# Patient Record
Sex: Female | Born: 1935 | Race: White | Smoking: Never smoker
Health system: Southern US, Community
[De-identification: ages and names within clinical notes are randomized; demographics above are authoritative.]

## PROBLEM LIST (undated history)

## (undated) DIAGNOSIS — I471 Supraventricular tachycardia, unspecified: Secondary | ICD-10-CM

## (undated) DIAGNOSIS — G459 Transient cerebral ischemic attack, unspecified: Secondary | ICD-10-CM

## (undated) DIAGNOSIS — I82409 Acute embolism and thrombosis of unspecified deep veins of unspecified lower extremity: Secondary | ICD-10-CM

## (undated) DIAGNOSIS — H9190 Unspecified hearing loss, unspecified ear: Secondary | ICD-10-CM

## (undated) DIAGNOSIS — K922 Gastrointestinal hemorrhage, unspecified: Secondary | ICD-10-CM

## (undated) DIAGNOSIS — M810 Age-related osteoporosis without current pathological fracture: Secondary | ICD-10-CM

## (undated) DIAGNOSIS — I495 Sick sinus syndrome: Secondary | ICD-10-CM

## (undated) DIAGNOSIS — D696 Thrombocytopenia, unspecified: Secondary | ICD-10-CM

## (undated) DIAGNOSIS — K661 Hemoperitoneum: Secondary | ICD-10-CM

## (undated) DIAGNOSIS — R946 Abnormal results of thyroid function studies: Secondary | ICD-10-CM

## (undated) DIAGNOSIS — I5032 Chronic diastolic (congestive) heart failure: Secondary | ICD-10-CM

## (undated) DIAGNOSIS — I10 Essential (primary) hypertension: Secondary | ICD-10-CM

## (undated) DIAGNOSIS — I639 Cerebral infarction, unspecified: Secondary | ICD-10-CM

## (undated) DIAGNOSIS — I4891 Unspecified atrial fibrillation: Secondary | ICD-10-CM

## (undated) DIAGNOSIS — E7800 Pure hypercholesterolemia, unspecified: Secondary | ICD-10-CM

## (undated) DIAGNOSIS — D219 Benign neoplasm of connective and other soft tissue, unspecified: Secondary | ICD-10-CM

## (undated) DIAGNOSIS — M6281 Muscle weakness (generalized): Secondary | ICD-10-CM

## (undated) HISTORY — DX: Chronic diastolic (congestive) heart failure: I50.32

## (undated) HISTORY — DX: Supraventricular tachycardia, unspecified: I47.10

## (undated) HISTORY — DX: Supraventricular tachycardia: I47.1

## (undated) HISTORY — DX: Thrombocytopenia, unspecified: D69.6

## (undated) HISTORY — DX: Hemoperitoneum: K66.1

## (undated) HISTORY — PX: DILATION AND CURETTAGE OF UTERUS: SHX78

## (undated) HISTORY — DX: Age-related osteoporosis without current pathological fracture: M81.0

## (undated) HISTORY — DX: Sick sinus syndrome: I49.5

## (undated) HISTORY — DX: Acute embolism and thrombosis of unspecified deep veins of unspecified lower extremity: I82.409

## (undated) HISTORY — DX: Cerebral infarction, unspecified: I63.9

## (undated) HISTORY — DX: Benign neoplasm of connective and other soft tissue, unspecified: D21.9

## (undated) HISTORY — DX: Abnormal results of thyroid function studies: R94.6

## (undated) HISTORY — DX: Unspecified atrial fibrillation: I48.91

## (undated) HISTORY — DX: Muscle weakness (generalized): M62.81

## (undated) HISTORY — DX: Transient cerebral ischemic attack, unspecified: G45.9

## (undated) HISTORY — DX: Essential (primary) hypertension: I10

## (undated) HISTORY — PX: CATARACT EXTRACTION: SUR2

---

## 1985-01-27 HISTORY — PX: OTHER SURGICAL HISTORY: SHX169

## 2005-01-27 DIAGNOSIS — G459 Transient cerebral ischemic attack, unspecified: Secondary | ICD-10-CM

## 2005-01-27 HISTORY — DX: Transient cerebral ischemic attack, unspecified: G45.9

## 2010-01-27 HISTORY — PX: CATARACT EXTRACTION W/ INTRAOCULAR LENS IMPLANT: SHX1309

## 2010-01-27 HISTORY — PX: CARDIAC PACEMAKER PLACEMENT: SHX583

## 2010-06-18 ENCOUNTER — Emergency Department (HOSPITAL_COMMUNITY): Payer: Medicare Other

## 2010-06-18 ENCOUNTER — Inpatient Hospital Stay (HOSPITAL_COMMUNITY)
Admission: EM | Admit: 2010-06-18 | Discharge: 2010-06-21 | DRG: 242 | Disposition: A | Discharge status: Home / Self Care | Payer: Medicare Other | Attending: Cardiology | Admitting: Cardiology

## 2010-06-18 DIAGNOSIS — I509 Heart failure, unspecified: Secondary | ICD-10-CM | POA: Diagnosis present

## 2010-06-18 DIAGNOSIS — D696 Thrombocytopenia, unspecified: Secondary | ICD-10-CM | POA: Diagnosis present

## 2010-06-18 DIAGNOSIS — I4892 Unspecified atrial flutter: Principal | ICD-10-CM | POA: Diagnosis present

## 2010-06-18 DIAGNOSIS — E876 Hypokalemia: Secondary | ICD-10-CM | POA: Diagnosis present

## 2010-06-18 DIAGNOSIS — J95811 Postprocedural pneumothorax: Secondary | ICD-10-CM | POA: Diagnosis not present

## 2010-06-18 DIAGNOSIS — R0602 Shortness of breath: Secondary | ICD-10-CM

## 2010-06-18 DIAGNOSIS — I498 Other specified cardiac arrhythmias: Secondary | ICD-10-CM | POA: Diagnosis present

## 2010-06-18 DIAGNOSIS — Y849 Medical procedure, unspecified as the cause of abnormal reaction of the patient, or of later complication, without mention of misadventure at the time of the procedure: Secondary | ICD-10-CM | POA: Diagnosis not present

## 2010-06-18 DIAGNOSIS — Z8673 Personal history of transient ischemic attack (TIA), and cerebral infarction without residual deficits: Secondary | ICD-10-CM

## 2010-06-18 DIAGNOSIS — R002 Palpitations: Secondary | ICD-10-CM

## 2010-06-18 DIAGNOSIS — I4891 Unspecified atrial fibrillation: Secondary | ICD-10-CM | POA: Diagnosis present

## 2010-06-18 DIAGNOSIS — I5033 Acute on chronic diastolic (congestive) heart failure: Secondary | ICD-10-CM | POA: Diagnosis present

## 2010-06-18 LAB — URINALYSIS, MICROSCOPIC ONLY
Bilirubin Urine: NEGATIVE
Ketones, ur: NEGATIVE mg/dL
Protein, ur: NEGATIVE mg/dL
Urobilinogen, UA: 0.2 mg/dL (ref 0.0–1.0)

## 2010-06-18 LAB — CBC
Hemoglobin: 14.1 g/dL (ref 12.0–15.0)
MCH: 31.2 pg (ref 26.0–34.0)
RBC: 4.52 MIL/uL (ref 3.87–5.11)
WBC: 12.1 10*3/uL — ABNORMAL HIGH (ref 4.0–10.5)

## 2010-06-18 LAB — DIFFERENTIAL
Basophils Relative: 0 % (ref 0–1)
Lymphs Abs: 1.4 10*3/uL (ref 0.7–4.0)
Monocytes Relative: 6 % (ref 3–12)
Neutro Abs: 9.9 10*3/uL — ABNORMAL HIGH (ref 1.7–7.7)
Neutrophils Relative %: 82 % — ABNORMAL HIGH (ref 43–77)

## 2010-06-18 LAB — CARDIAC PANEL(CRET KIN+CKTOT+MB+TROPI)
CK, MB: 2.4 ng/mL (ref 0.3–4.0)
Relative Index: INVALID (ref 0.0–2.5)
Relative Index: INVALID (ref 0.0–2.5)
Total CK: 65 U/L (ref 7–177)
Troponin I: <0.3 ng/mL (ref <0.30)

## 2010-06-18 LAB — PRO B NATRIURETIC PEPTIDE: Pro B Natriuretic peptide (BNP): 22545 pg/mL — ABNORMAL HIGH (ref 0–450)

## 2010-06-18 LAB — POCT I-STAT, CHEM 8
BUN: 43 mg/dL — ABNORMAL HIGH (ref 6–23)
Calcium, Ion: 1.05 mmol/L — ABNORMAL LOW (ref 1.12–1.32)
Creatinine, Ser: 1.4 mg/dL — ABNORMAL HIGH (ref 0.4–1.2)
TCO2: 20 mmol/L (ref 0–100)

## 2010-06-18 LAB — POCT CARDIAC MARKERS
Myoglobin, poc: 123 ng/mL (ref 12–200)
Troponin i, poc: <0.05 ng/mL (ref 0.00–0.09)

## 2010-06-18 LAB — TSH: TSH: 7.984 u[IU]/mL — ABNORMAL HIGH (ref 0.350–4.500)

## 2010-06-18 LAB — PROTIME-INR: Prothrombin Time: 14.2 seconds (ref 11.6–15.2)

## 2010-06-18 LAB — SODIUM, URINE, RANDOM: Sodium, Ur: 105 mEq/L

## 2010-06-18 LAB — LACTIC ACID, PLASMA: Lactic Acid, Venous: 2.8 mmol/L — ABNORMAL HIGH (ref 0.5–2.2)

## 2010-06-18 LAB — CREATININE, URINE, RANDOM: Creatinine, Urine: 28.93 mg/dL

## 2010-06-18 LAB — APTT: aPTT: 25 seconds (ref 24–37)

## 2010-06-19 ENCOUNTER — Inpatient Hospital Stay (HOSPITAL_COMMUNITY): Payer: Medicare Other

## 2010-06-19 DIAGNOSIS — I495 Sick sinus syndrome: Secondary | ICD-10-CM

## 2010-06-19 DIAGNOSIS — I359 Nonrheumatic aortic valve disorder, unspecified: Secondary | ICD-10-CM

## 2010-06-19 LAB — BASIC METABOLIC PANEL
CO2: 27 mEq/L (ref 19–32)
Calcium: 8.5 mg/dL (ref 8.4–10.5)
Chloride: 104 mEq/L (ref 96–112)
Creatinine, Ser: 1.14 mg/dL (ref 0.4–1.2)
GFR calc Af Amer: 53 mL/min — ABNORMAL LOW (ref >60)
Sodium: 140 mEq/L (ref 135–145)

## 2010-06-19 LAB — LIPID PANEL
Cholesterol: 128 mg/dL (ref 0–200)
LDL Cholesterol: 65 mg/dL (ref 0–99)
Total CHOL/HDL Ratio: 3.1 RATIO
Triglycerides: 112 mg/dL (ref <150)
VLDL: 22 mg/dL (ref 0–40)

## 2010-06-19 LAB — CBC
Hemoglobin: 12.2 g/dL (ref 12.0–15.0)
Platelets: 139 10*3/uL — ABNORMAL LOW (ref 150–400)
RBC: 3.9 MIL/uL (ref 3.87–5.11)
WBC: 8.3 10*3/uL (ref 4.0–10.5)

## 2010-06-20 ENCOUNTER — Inpatient Hospital Stay (HOSPITAL_COMMUNITY): Payer: Medicare Other

## 2010-06-20 LAB — BASIC METABOLIC PANEL
BUN: 29 mg/dL — ABNORMAL HIGH (ref 6–23)
Chloride: 98 mEq/L (ref 96–112)
GFR calc non Af Amer: 49 mL/min — ABNORMAL LOW (ref >60)
Glucose, Bld: 99 mg/dL (ref 70–99)
Potassium: 3 mEq/L — ABNORMAL LOW (ref 3.5–5.1)
Sodium: 135 mEq/L (ref 135–145)

## 2010-06-20 LAB — CBC
HCT: 38.3 % (ref 36.0–46.0)
Hemoglobin: 12.9 g/dL (ref 12.0–15.0)
MCV: 92.3 fL (ref 78.0–100.0)
RBC: 4.15 MIL/uL (ref 3.87–5.11)
RDW: 14.8 % (ref 11.5–15.5)
WBC: 9.9 10*3/uL (ref 4.0–10.5)

## 2010-06-21 ENCOUNTER — Inpatient Hospital Stay (HOSPITAL_COMMUNITY): Payer: Medicare Other

## 2010-06-21 LAB — BASIC METABOLIC PANEL
CO2: 29 mEq/L (ref 19–32)
Calcium: 8.4 mg/dL (ref 8.4–10.5)
GFR calc Af Amer: >60 mL/min (ref >60)
GFR calc non Af Amer: >60 mL/min (ref >60)
Potassium: 3.7 mEq/L (ref 3.5–5.1)
Sodium: 138 mEq/L (ref 135–145)

## 2010-06-24 LAB — CULTURE, BLOOD (ROUTINE X 2)
Culture  Setup Time: 201205222337
Culture: NO GROWTH

## 2010-07-03 NOTE — Consult Note (Signed)
  [name], [name]             ACCOUNT NO.:  [id number]  MEDICAL RECORD NO.:  [id number]           PATIENT TYPE:  LOCATION:                                 FACILITY:  PHYSICIAN:  Duke Salvia, MD, FACCDATE OF BIRTH:  30-Nov-1910  DATE OF CONSULTATION: DATE OF DISCHARGE:                                CONSULTATION   PREOPERATIVE DIAGNOSES:  Tachybrady syndrome with supraventricular tachycardia, atrial flutter, and posttermination pauses of greater than 6 seconds.  POSTOPERATIVE DIAGNOSES:  Tachybrady syndrome with supraventricular tachycardia, atrial flutter, and posttermination pauses of greater than 6 seconds.  PROCEDURE:  Dual-chamber pacemaker implantation.  Following obtaining informed consent, the patient was brought to the electrophysiology laboratory and placed on the fluoroscopic table in supine position.  After routine prep and drape of the left upper chest, lidocaine was infiltrated in prepectoral subclavicular region.  Anincision was made and carried down to layer of the prepectoral fascia using electrocautery and sharp dissection.  A pocket was formed similarly, hemostasis was obtained.  Because of the patient's age, we undertook a left upper extremity venogram which demonstrated the course and pacing in the extrathoracic left subclavian vein.  This then served as a target for venous cannulation which was complicated notwithstanding this by 2 arterial punctures both of which were held for greater than 2 minutes.  Separate venipunctures were accomplished, guidewires were placed and retained, 7- [place] sheath were replaced which were passed sequentially.  A St. Jude 1940, 52 cm passive fixation ventricular lead serial #[id number] and a St. Jude 1944 passive fixation atrial lead serial #[id number].  Under fluoroscopic guidance, these were manipulated to the right ventricular apex from the right atrial appendage.  Respectively where bipolar R-wave was set at 28  with a pace impedance of 815, threshold of 0.5 and 0.5, current threshold 0.5 mA.  There is no diaphragmatic pacing at 10 volts.  Bipolar P-wave was 3.2 with a pace impedance of 427, threshold point 0.9 and 0.5, current threshold was 1.9 mA.  The leads were secured to the prepectoral fascia and then attached to a Medtronic Reveal  pulse generator, serial # [id number]  [id number].  P-synchronous pacing was identified.  The pocket was copiously irrigated with antibiotic-containing saline solution.  Hemostasis was assured.  The leads and pulse generator was placed in the pocket, secured to the prepectoral fascia.  The wound was closed in 3 layers in normal fashion. The wound was washed, dried and a benzoin and Steri-Strip dressing was applied.  Needle counts, sponge counts and instrument counts were correct at the end of the procedure according to the staff.  The patient tolerated the procedure without apparent complication.     Duke Salvia, MD, [facility]     SCK/MEDQ  D:  06/19/2010  T:  06/20/2010  Job:  [id number]  Electronically Signed by Sherryl Manges MD [facility] on 07/03/2010 07:36:03 AM

## 2010-07-03 NOTE — Discharge Summary (Signed)
NAMECHERRILL, SCRIMA             ACCOUNT NO.:  [id number]  MEDICAL RECORD NO.:  [id number]           PATIENT TYPE:  I  LOCATION:  6524                         FACILITY:  [facility]  PHYSICIAN:  Duke Salvia, MD, FACCDATE OF BIRTH:  07/06/1910  DATE OF ADMISSION:  06/18/2010 DATE OF DISCHARGE:  06/21/2010                              DISCHARGE SUMMARY   PROCEDURES: 1. Insertion of a Medtronic revealed pulse generator dual lead     pacemaker. 2. 2-D echocardiogram. 3. Serial two-view chest x-rays x3.  PRIMARY FINAL DISCHARGE DIAGNOSIS:  Atrial flutter and supraventricular tachycardia with tachybrady syndrome and posttermination pauses greater than 6 seconds.  SECONDARY DIAGNOSES: 1. Acute on chronic diastolic congestive heart failure. 2. History of angina, medical therapy recommended. 3. Remote history of transient ischemic attack in 2007. 4. History of uterine fibroids. 5. Postprocedure pneumothorax, approximately 5%, resolved prior to     discharge. 6. Mild thrombocytopenia with platelets of 137,000 at discharge. 7. Mildly abnormal thyroid function studies with a TSH of 7.984 (upper     limit of normal 4.5), free T4 within normal limits at 1.21 and T3     slightly low at 69.2 (lower limit of normal 80), follow up with     primary care.  TIME AT DISCHARGE:  41 minutes.  HOSPITAL COURSE:  Rachel Shah is a 77 year old female with a history of PAF.  She was here visiting her daughter from [place] and had increasing shortness of breath and palpitations.  She was in AFib RVR and she was felt to have CHF with a proBNP that was elevated at 22,545.  She was admitted for further evaluation.  Rate control with IV Lopressor totalling 35 mg was unsuccessful. Cardizem was tried but she had a post termination pauses of 6 seconds with a junctional rhythm that quickly returned to rapid AFib.  She was started on IV amiodarone and spontaneously converted to sinus rhythm after the bolus.   She was continued on her other home medications.  She was diuresed with IV Lasix at 40 mg IV b.i.d. and by discharge her O2 saturation was 95% on room air.  She had some hypokalemia with the diuresis and her potassium was supplemented.  Her BUN and creatinine were elevated on admission at 43/1.4.  This actually improved during the course of her hospital stay and at discharge she is to resume her home dose of Lasix.  Her BUN is 25 with a creatinine of 0.85 and a GFR greater than 60.  A lipid profile showed total cholesterol of 128, triglycerides 112, HDL 41, LDL 65.  Thyroid function studies were described above.  Her white count was slightly elevated on admission at 12.1 thousand and blood cultures were done.  They are negative so far but not completed until Jun 23, 2010.  She had PCR for MRSA which was negative.  Urinalysis only showed rare bacteria.  Initial chest x-ray showed mild interstitial edema.  She also had a left upper lobe calcified granuloma.  She was evaluated by EP for the post termination pauses.  She was felt to have tachybrady syndrome as well as SVT and  atrial flutter.  Dr. Graciela Husbands considered her a candidate for pacemaker and by November 19, 2010, her respiratory status was felt stabilized for the device.  She had a Medtronic Reveal pulse generator dual lead pacemaker inserted.  She tolerated the procedure well.  Postprocedure chest x-ray showed approximately 5% pneumothorax and moderate bilateral effusions. She was followed with serial chest x-rays and her exam remained stable. By Jun 21, 2010, the left apical pneumothorax was not competently identified.  She had an indeterminate focal density at the right lung base and small nodular densities in the right midlung.  There were compression fractures involving the lower thoracic and possibly one in the upper lumbar spine.  She had small bilateral pleural effusions. Because of the nodular densities in the right midlung and  the focal density at the right lung base, followup chest x-rays or a chest CT is recommended for further evaluation.  The patient prefers to wait until she returns home to obtain these studies.  She also prefers to get her postprocedure wound check and cardiac followup in [place].  A 2-D echocardiogram showed an EF of 55-60% with a moderately calcified aortic valve annulus and mild aortic regurgitation.  A trivial pericardial effusion was identified.  Her left atrial diameter was 48 mm.  Her meds were reviewed and the peridium had been discontinued because of a creatinine clearance at that time less than 50.  This can be resumed because her creatinine clearance is now greater than 60 and this can be followed as an outpatient.  On Jun 21, 2010, [name] was evaluated by Dr. Graciela Husbands.  She is considered stable for discharge, to follow up as arranged.  DISCHARGE INSTRUCTIONS:  Activity level is to be increased gradually per the discharge instruction sheet.  She is to call our office for problems with the incision.  She is to follow up with Dr. Graciela Husbands as needed.  She is to follow up with Dr. Ramiro Harvest and keep the appointment.  DISCHARGE MEDICATIONS: 1. Lopressor 50 mg b.i.d. 2. Digoxin 0.125 mg one-half tablet daily. 3. Zocor 20 mg daily. 4. Lasix 20 mg daily. 5. Imdur 30 mg daily. 6. Aspirin 81 mg a day. 7. Vitamin E OTC daily.     Theodore Demark, PA-C   ______________________________ Duke Salvia, MD, [facility]    RB/MEDQ  D:  06/21/2010  T:  06/22/2010  Job:  [id number]  cc:   Kateri Mc  Electronically Signed by Theodore Demark PA-C on 06/26/2010 11:30:04 AM Electronically Signed by Sherryl Manges MD [facility] on 07/03/2010 07:36:05 AM

## 2010-07-05 NOTE — H&P (Signed)
Rachel Shah, Rachel Shah             ACCOUNT NO.:  [id number]  MEDICAL RECORD NO.:  [id number]           PATIENT TYPE:  I  LOCATION:  2903                         FACILITY:  [facility]  PHYSICIAN:  Jesse Sans. Latayna Ritchie, MD, FACCDATE OF BIRTH:  05/05/1910  DATE OF ADMISSION:  06/18/2010 DATE OF DISCHARGE:                             HISTORY & PHYSICAL   CARDIOLOGIST:  Dr. Kateri Mc in [place], [place].  His phone number is [phone].  Fax number [phone].  CHIEF COMPLAINT:  "Not feeling well."  HISTORY OF PRESENT ILLNESS:  Rachel Shah is a very pleasant 77 year old white woman who is visiting from [place], [place], since Jun 11, 2010. She presented to the emergency department with an increased shortness of breath, palpitations.  The patient developed gradual symptoms over 1-2 weeks with progressive worsening of orthopnea, leg swelling and generalized weakness.  The patient also reports several episodes of diarrhea without any red blood or mucus and no abdominal pain.  The patient denies any chest pain, pleuritic pain or immobilization. The patient reports compliance with her medications and diet.   PAST MEDICAL HISTORY: 1. Hypertension. 2. CHF. Unknown date and results of the last echocardiogram. 3. History of angina. No catheterization per the patient's report,     the patient is being treated medically. 4. History of paroxysmal atrial fibrillation. Not on anticoagulation therapy. 5. History of TIA in 2007. 6. History of uterine fibroids.  ALLERGIES:  PLAVIX that caused "stomach problems."  HOME MEDICATIONS: 1. Lasix 20 mg 1 p.o. daily. 2. Metoprolol 25 mg 1 p.o. daily. 3. KCl 10 mEq 1 p.o. daily. 4. Simvastatin 20 mg 1 p.o. daily. 5. Imdur 30 mg 1 p.o. daily. 6. Aspirin 81 mg p.o. daily. 7. Vitamin E 400 units 1 capsule daily.  SOCIAL HISTORY:  The patient lives in [place], [place].  She lives alone.  Currently visiting daughter in [place].  Denies any use  of tobacco, alcohol, or illicit drug use.  The patient is full code.  FAMILY HISTORY:  Negative for coronary artery disease.  Sister died at the age of 39 from lung cancer.  REVIEW OF SYSTEMS: The patient endorses chills and sweats, lightheadedness.  The patient complains of chronic hearing loss bilaterally.  She denies any chest pain.  She endorses shortness of breath, orthopnea, PND, edema, palpitations.  Denies any syncopal episodes, cough or wheezing.  Denies any frequency, urgency, dysuria or hematuria.  The patient states that she feels anxious and weak.  She denies any arthralgias, joint swelling or joint pain.  The patient denies any nausea or vomiting.  Endorses several episodes of diarrhea yesterday.  Denies any melena, hematochezia or mucous or symptoms of GERD.  The patient denies any polyuria, polydipsia, heat or cold intolerance, any skin rashes.  PHYSICAL EXAMINATION:  VITAL SIGNS:  Temperature of 97.6.  Pulse rate at the time of admission of 149, currently at the rate of 43.  Respiratory rate of 32.  Blood pressure at the time of admission of 102/76, currently at 120/82.  She is saturating 98% on non-rebreather mask. GENERAL:  The patient is alert and oriented x3.  She is in moderate  distress secondary to shortness of breath. HEENT:  Head is atraumatic.  EOMs are intact bilaterally.  Sclerae are clear bilaterally.  Oropharynx without any injection.  Teeth in good repair.  Mucous moist membranes.  Uvula midline. NECK:  Supple.  JVD is about 5 cm at clavicular sternal angle bilaterally.  No thyromegaly.  No lymphadenopathy.  No bruits bilaterally. CARDIOVASCULAR:  The patient has irregularly irregular rhythm with distant heart sounds.  No murmurs are appreciated.  Pulses are faint of the upper and lower extremities bilaterally. LUNGS:  There is a decreased air movement bilaterally.  There are crackles to her lower lobes bilaterally.  No wheezing  bilaterally. SKIN:  Looks mottled and cool to touch but no rashes or lesions. ABDOMEN:  Bowel sounds positive, nondistended, nontender to palpation. No hepatosplenomegaly. EXTREMITIES:  No clubbing.  There is ankle edema 2+/4 bilaterally. MUSCULOSKELETAL:  Unremarkable. NEUROLOGIC:  Cranial nerves III-XII grossly intact.  Strength is 5+/5 of all extremities bilaterally.  Chest x-ray demonstrates CHF findings with a questionable bibasilar infiltrates.  EKG shows rate between 130s and 150s.  Rhythm is atrial fibrillation, axis of -40 degrees.  No previous EKG to compare with.  LABORATORY DATA:  A white blood count of 12.1 with ANC of 9.9, hemoglobin of 14.1, platelet count of 216.  Sodium of 139, potassium of 5.1, chloride 109, bicarb 20, anion gap of 10, BUN of 43, creatinine of 1.4, glucose of 195.  Point-of-care markers with troponin less than 0.05.  CK-MB of 1.2, INR of 1.08.  ASSESSMENT AND PLAN:  Ms. [name] is a 77 year old woman with a past medical history of hypertension, CHF, TIA and possibly atrial fibrillation and angina. She was admitted with atrial fibrillation with a heart rate in 140s. While in ED, she was administered a Cardizem 10 mg IV bolus x1  which caused a 3-second asystolic pause. Currently, the patient is in junctional rhythm with bradycardia and heart rate in low 40s. Symptoms are consistent with a sick sinus syndrome.  Unfortunately, the patient's past medical history is somewhat limited in light of her being out of state. I attempted to contact Dr. Ramiro Harvest, the patient's cardiologist from [place], [place]. However, this was unsuccessfull.I was placed on hold for more than 15 minutes.  When I called back and spoke with a receptionist, I was informed that both nurse and Dr. Ramiro Harvest were unavailable. I was informed that a physician on call was also unavailable. I left my pager number with the answering service with a request for Dr. Ramiro Harvest to contact me as  soon as possible.  We also faxed over a written consent form to Dr. Merrilyn Puma office with a request to release the patient's medical record ASAP. Unfortunately, records are still not available at the time of dictation.  Etiology of the patient's symptoms may be related to a dilated cardiomyopathy versus pulmonary disease versus sepsis versus coronary artery disease.  We will admit the patient to a CCU.  We will check TSH to rule out an underlying metabolic cause. We will order a 2-D echocardiogram; and cycle cardiac enzymes. We will consult Dr. Johney Frame for EP studies. The patient most likely would need a pacemaker placement. We will check the patient's blood cultures and lactic acid levels to rule out bacteremia/sepsis. The findings were discussed with Dr. Daleen Squibb, the patient and the patient's daughter.  Both the patient and her daughter verbalized understanding of the plan of treatment.     Deatra Robinson, MD   ______________________________ Jesse Sans. Alphonse Asbridge, MD,  [facility]    NK/MEDQ  D:  06/18/2010  T:  06/19/2010  Job:  [id number]  Electronically Signed by Deatra Robinson  on 06/20/2010 10:59:37 AM Electronically Signed by Valera Castle MD [facility] on 07/05/2010 01:37:02 PM

## 2010-07-29 ENCOUNTER — Telehealth: Payer: Self-pay | Admitting: Internal Medicine

## 2010-07-29 NOTE — Telephone Encounter (Signed)
I spoke with the patient's daughter. I made her aware she should be fine for x-rays at the dentist office.

## 2010-07-29 NOTE — Telephone Encounter (Signed)
Pt daughter calling wondering if there are any restrictions for pt mother when going to the dentist, for instance x-rays.  Dr. Graciela Husbands put a pacemaker in under emergency circumstances when pt was in town.

## 2010-08-12 ENCOUNTER — Telehealth: Payer: Self-pay | Admitting: Internal Medicine

## 2010-08-12 NOTE — Telephone Encounter (Signed)
Per pt daughter call, pt is experiencing sharp pain in the crown of head, gone through period where pt is feeling tightness in chest and nauseas, pt is also feeling anxious. Pt having feelings of impending doom.  Pt said symptoms have have been occur off and on since the pace maker implantation.  Pt daughter believes pt is self medicating taking too much of heart medicine, digoxin to help to sleep.   Pt c/o pace maker acting up, daughter believes this is due to pt over medicating on meds.  Pt would like to know if she has pain can she take nitroglycerin patches Pt also complains of food tasting off.  Pt had pacemaker put in May 22nd. Pt currently lives in [place], [place].  Please return daughter call to advise.

## 2010-08-12 NOTE — Telephone Encounter (Signed)
Spoke with daughter - states pt has been complaining of shooting pains in her head that are fleeting.  She has also had some chest tightness with nausea but denies pain.  No shortness of breath.  Daughter states pt has been taking extra Digoxin to help her sleep.  Advised daughter that pt can not do this and that she should she her primary care MD there in [place] for evaluation of the shooting pains in her head, her chest tightness(I do not see any documentation of CAD) and a Digoxin level.  Daughter states she there is a huge communication issue between pt and her PCP because pt is very HOH and PCP has a very strong accent.  Instructed daughter that if pt doesn't feel comfortable with seeing her PCP she should report to an Urgent Care for eval.  Daugher states understanding.

## 2010-11-27 ENCOUNTER — Telehealth: Payer: Self-pay | Admitting: Internal Medicine

## 2010-11-27 NOTE — Telephone Encounter (Signed)
Called and gave daughter information requested.  Asked if mother had anyone following pacemake and she stated she was not happy with the cardiologist and wanted recommendation of a group/cardiologist in [place].  Advised her I didn't know anyone

## 2010-11-27 NOTE — Telephone Encounter (Signed)
Will forward to Dr. Graciela Husbands to see if he knows of a cardiologist in that area.

## 2010-11-27 NOTE — Telephone Encounter (Signed)
Pt is having surgery and they need to know what type of device she has and if it has a defib or not

## 2010-11-28 NOTE — Telephone Encounter (Signed)
The EPs I know in [place] are [employer], Bennie Dallas, Milas Hock All will be very competent,  i dont know wehre they are or there patient interaction skills

## 2010-11-29 ENCOUNTER — Encounter: Payer: Self-pay | Admitting: *Deleted

## 2010-11-29 NOTE — Telephone Encounter (Signed)
I spoke with the patient's daughter regarding Dr. Odessa Fleming recommendations for cardiology care in [place]. A letter was mailed to Ms. Sloane with the names of each physician at her request.   Lorain Childes- the contact # listed at [phone] rings to a corporate office. I reached Ms. Sloan on her cell # [phone].

## 2010-12-10 ENCOUNTER — Telehealth: Payer: Self-pay | Admitting: Internal Medicine

## 2010-12-10 NOTE — Telephone Encounter (Signed)
I spoke with the patient's daughter. She states the patient is back in town from [place] and she will be having cataract surgery with Dr. Emmit Pomfret on 11/21. I explained to her that we have not seen the patient since her device was implanted in May 2012. The patient has had f/u in [place]. I explained we will need to see the patient prior to clearance. I have reviewed this with Tereso Newcomer, PA and Dr. Clifton James (DOD). The patient can be seen on Friday 11/16 at 9:15 am with [facility]. I have left a message for the patient's daughter about the appt, but that they also need to get her records from [place] and have them faxed to [place].

## 2010-12-10 NOTE — Telephone Encounter (Signed)
Pt's daughter wants to get cardiac clearance for cataract surgery [phone] please call

## 2010-12-13 ENCOUNTER — Ambulatory Visit: Payer: Medicare Other | Admitting: Physician Assistant

## 2010-12-13 ENCOUNTER — Encounter: Payer: Self-pay | Admitting: *Deleted

## 2010-12-16 ENCOUNTER — Ambulatory Visit (INDEPENDENT_AMBULATORY_CARE_PROVIDER_SITE_OTHER): Payer: Medicare Other | Admitting: *Deleted

## 2010-12-16 ENCOUNTER — Encounter: Payer: Self-pay | Admitting: Internal Medicine

## 2010-12-16 ENCOUNTER — Ambulatory Visit (INDEPENDENT_AMBULATORY_CARE_PROVIDER_SITE_OTHER): Payer: Medicare Other | Admitting: Physician Assistant

## 2010-12-16 ENCOUNTER — Encounter: Payer: Self-pay | Admitting: Physician Assistant

## 2010-12-16 ENCOUNTER — Telehealth: Payer: Self-pay | Admitting: Physician Assistant

## 2010-12-16 VITALS — BP 166/79 | HR 66 | Wt 122.0 lb

## 2010-12-16 DIAGNOSIS — I4891 Unspecified atrial fibrillation: Secondary | ICD-10-CM

## 2010-12-16 DIAGNOSIS — Z950 Presence of cardiac pacemaker: Secondary | ICD-10-CM

## 2010-12-16 DIAGNOSIS — I10 Essential (primary) hypertension: Secondary | ICD-10-CM

## 2010-12-16 DIAGNOSIS — I495 Sick sinus syndrome: Secondary | ICD-10-CM

## 2010-12-16 DIAGNOSIS — R079 Chest pain, unspecified: Secondary | ICD-10-CM | POA: Insufficient documentation

## 2010-12-16 DIAGNOSIS — Z01810 Encounter for preprocedural cardiovascular examination: Secondary | ICD-10-CM

## 2010-12-16 LAB — PACEMAKER DEVICE OBSERVATION
AL IMPEDENCE PM: 496 Ohm
ATRIAL PACING PM: 95.19
BAMS-0001: 150 {beats}/min
BATTERY VOLTAGE: 3.02 V
RV LEAD IMPEDENCE PM: 640 Ohm
VENTRICULAR PACING PM: 0.04

## 2010-12-16 MED ORDER — FAMOTIDINE 20 MG PO TABS: 20.0000 mg | ORAL_TABLET | Freq: Every day | ORAL | Status: DC

## 2010-12-16 NOTE — Assessment & Plan Note (Signed)
She describes a chronic history of chest pain.  However, she really does not have any unstable cardiac conditions.  She is seeking clearance for a low risk procedure.  I discussed her case with Dr. Peter [place].  We do not feel she needs further cardiovascular testing and can proceed with her cataract surgery.

## 2010-12-16 NOTE — Patient Instructions (Signed)
Your physician has recommended you make the following change in your medication: start pepcid 20 mg daily, this was sent in to [facility] in [place]

## 2010-12-16 NOTE — Assessment & Plan Note (Signed)
Her device will be interrogated today.

## 2010-12-16 NOTE — Progress Notes (Signed)
Pacer check in clinic  

## 2010-12-16 NOTE — Assessment & Plan Note (Signed)
Maintaining NSR.  She is not on coumadin for unclear reasons.  This will be further managed by her regular cardiologist in [place].

## 2010-12-16 NOTE — Progress Notes (Signed)
History of Present Illness: PCP:  Dr. Ramiro Harvest [facility] [place], [place]) Primary Cardiologist:  Dr. Ramiro Harvest Primary Electrophysiologist:  Dr. Ginette Pitman Rachel Shah is a 77 y.o. female presents for surgical clearance.  The patient lives in [place].  Her daughter lives in [place] and she is a patient of Dr. Odessa Fleming.  She was visiting in 5/12 when she developed diastolic heart failure in the setting of atrial fibrillation with rapid ventricular rate.  She developed tachybradycardia syndrome while in the hospital and underwent pacemaker implantation.  She moved back to plan to continue her care there.  She is visiting her daughter in [place] and plans to have cataract surgery tomorrow with Dr. Jettie Pagan.  She was cleared by her cardiologist in [place].  However, this was done one month ago and she apparently needs reevaluation here prior to her surgery.  She has a history hypertension, TIA and angina.  She reports problems with angina for the last 5 years.  They always occur in the early morning hours (3 AM).  She is fairly active for her age.  She does not have any exertional chest pain or shortness of breath.  She takes isosorbide.  She has not noticed a significant difference in her symptoms since starting this medication.  She denies having any cardiac catheterization or stress testing.  We have requested records from her cardiologist in [place].  Her daughter has also requested records prior to the visit today.  Unfortunately, we have not received any information.  An echocardiogram performed while hospitalized 5/12 demonstrated moderate LVH, EF 55-60%, mild AI, mild-moderate LAE, lipomatous hypertrophy of the atrial septum.  She denies orthopnea, PND or syncope.  She has chronic lower extremity edema without change.  Past Medical History  Diagnosis Date  . Atrial flutter   . Supraventricular tachycardia   . Tachy-brady syndrome     post Medtronic revealed pulse generator dual lead pacemaker  . CHF (congestive  heart failure)     Acute on chronic diastolic congestive heart failure  . Angina at rest   . Transient ischemic attack 2007    Remote history of  . Fibroids     History of uterine fibroids  . Thrombocytopenia     Mild  . Abnormal thyroid function test     Mildly abnormal thyroid function studies with a TSH of 7.984 (upper limit of normal 4.5), free T4 within normal limits at 1.21 and T3  slightly low at 69.2 (lower limit of normal 80), follow up with primary care  . Hypertension     Current Outpatient Prescriptions  Medication Sig Dispense Refill  . ACTONEL 150 MG tablet Take 1 tablet by mouth Daily.      Marland Kitchen aspirin 81 MG tablet Take 81 mg by mouth daily.        . digoxin (LANOXIN) 0.125 MG tablet Take by mouth. 1/2 tablet daily       . furosemide (LASIX) 20 MG tablet Take 20 mg by mouth daily.        . isosorbide mononitrate (IMDUR) 30 MG 24 hr tablet Take 30 mg by mouth daily.        . metoprolol (LOPRESSOR) 50 MG tablet Take 50 mg by mouth 2 (two) times daily.        . simvastatin (ZOCOR) 20 MG tablet Take 20 mg by mouth at bedtime.        Marland Kitchen VITAMIN E PO Take 1 tablet by mouth daily. ( OTC )  Allergies: Allergies  Allergen Reactions  . Plavix (Clopidogrel Bisulfate)     STOMACH UPSTET    History  Substance Use Topics  . Smoking status: Never Smoker   . Smokeless tobacco: Not on file  . Alcohol Use: No     ROS:  Please see the history of present illness.   Gastrointestinal ROS: positive for - swallowing difficulty/pain, occasional diarrhea and occasional vomiting.  All other systems reviewed and negative.   Vital Signs: BP 166/79  Pulse 66  Wt 122 lb (55.339 kg)  Repeat by me 150/76.  PHYSICAL EXAM: Well nourished, well developed, in no acute distress HEENT: normal Neck: no JVD Cardiac:  normal S1, S2; RRR; no murmur Lungs:  clear to auscultation bilaterally, no wheezing, rhonchi or rales Abd: soft, nontender, no hepatomegaly Ext: no edema Skin: warm  and dry Neuro:  CNs 2-12 intact, no focal abnormalities noted  EKG:  A paced, HR 66, LAD, NSSTTW changes  ASSESSMENT AND PLAN:

## 2010-12-16 NOTE — Assessment & Plan Note (Signed)
Uncontrolled.  Continue to monitor for now.  She can follow up with her doctor in [place].

## 2010-12-16 NOTE — Telephone Encounter (Signed)
F/up to previous problem Pt's daughter called and said surgical clearance has to go [facility] at [phone] today. She is having surgery tomorrow

## 2010-12-16 NOTE — Assessment & Plan Note (Signed)
This is atypical for angina.  I have been unable to receive any records from her doctor in [place].  I have recommended a trial of an H2RA.  She will try Pepcid 20 mg QPM.

## 2010-12-16 NOTE — Telephone Encounter (Signed)
Clearance was given today at office visit with Rachel Shah.

## 2011-06-16 ENCOUNTER — Other Ambulatory Visit: Payer: Self-pay | Admitting: Internal Medicine

## 2011-06-16 MED ORDER — ISOSORBIDE MONONITRATE ER 30 MG PO TB24: 30.0000 mg | ORAL_TABLET | Freq: Every day | ORAL | Status: DC

## 2011-06-16 NOTE — Telephone Encounter (Signed)
Out of pills 

## 2011-06-17 ENCOUNTER — Other Ambulatory Visit: Payer: Self-pay | Admitting: *Deleted

## 2011-06-18 ENCOUNTER — Telehealth: Payer: Self-pay | Admitting: Internal Medicine

## 2011-06-18 NOTE — Telephone Encounter (Signed)
Medication was refilled 06/16/11

## 2011-06-18 NOTE — Telephone Encounter (Signed)
[facility] requesting isosorbide refill [phone]

## 2011-06-26 ENCOUNTER — Ambulatory Visit (INDEPENDENT_AMBULATORY_CARE_PROVIDER_SITE_OTHER): Payer: Medicare Other | Admitting: Internal Medicine

## 2011-06-26 ENCOUNTER — Encounter: Payer: Self-pay | Admitting: Internal Medicine

## 2011-06-26 ENCOUNTER — Telehealth: Payer: Self-pay | Admitting: Internal Medicine

## 2011-06-26 VITALS — BP 153/71 | HR 61 | Ht 62.0 in | Wt 120.0 lb

## 2011-06-26 DIAGNOSIS — I10 Essential (primary) hypertension: Secondary | ICD-10-CM

## 2011-06-26 DIAGNOSIS — R0600 Dyspnea, unspecified: Secondary | ICD-10-CM | POA: Insufficient documentation

## 2011-06-26 DIAGNOSIS — I4891 Unspecified atrial fibrillation: Secondary | ICD-10-CM

## 2011-06-26 DIAGNOSIS — Z950 Presence of cardiac pacemaker: Secondary | ICD-10-CM

## 2011-06-26 DIAGNOSIS — R0601 Orthopnea: Secondary | ICD-10-CM

## 2011-06-26 DIAGNOSIS — R079 Chest pain, unspecified: Secondary | ICD-10-CM

## 2011-06-26 DIAGNOSIS — I495 Sick sinus syndrome: Secondary | ICD-10-CM

## 2011-06-26 LAB — PACEMAKER DEVICE OBSERVATION
AL IMPEDENCE PM: 416 Ohm
ATRIAL PACING PM: 92
BATTERY VOLTAGE: 3 V
VENTRICULAR PACING PM: 0.08

## 2011-06-26 NOTE — Assessment & Plan Note (Addendum)
Associated with the above question demand ischemia

## 2011-06-26 NOTE — Telephone Encounter (Signed)
The patient daughter is aware and will be here this afternoon at 4:00 pm.

## 2011-06-26 NOTE — Progress Notes (Signed)
  HPI  Rachel Shah is a 78 y.o. female Seen because of episodes of nocturnal shortness of breath associated with palpitations and chest pain. She has a history of atrial fibrillation and tachybradycardia syndrome and underwent pacemaker implantation about a year ago. These episodes have occurred more recently. Her physician at home for reasons not clear change her metoprolol from twice daily to once daily   Past Medical History  Diagnosis Date  . Atrial flutter   . Supraventricular tachycardia   . Tachy-brady syndrome     post Medtronic revealed pulse generator dual lead pacemaker  . CHF (congestive heart failure)     Acute on chronic diastolic congestive heart failure  . Angina at rest   . Transient ischemic attack 2007    Remote history of  . Fibroids     History of uterine fibroids  . Thrombocytopenia     Mild  . Abnormal thyroid function test     Mildly abnormal thyroid function studies with a TSH of 7.984 (upper limit of normal 4.5), free T4 within normal limits at 1.21 and T3  slightly low at 69.2 (lower limit of normal 80), follow up with primary care  . Hypertension     Past Surgical History  Procedure Date  . Cardiac pacemaker placement     Current Outpatient Prescriptions  Medication Sig Dispense Refill  . ACTONEL 150 MG tablet Take 1 tablet by mouth Daily.      Marland Kitchen aspirin 81 MG tablet Take 81 mg by mouth daily.        . digoxin (LANOXIN) 0.125 MG tablet Take by mouth. 1/2 tablet daily       . furosemide (LASIX) 20 MG tablet Take 20 mg by mouth daily.        . isosorbide mononitrate (IMDUR) 30 MG 24 hr tablet Take 1 tablet (30 mg total) by mouth daily.  90 tablet  3  . metoprolol (LOPRESSOR) 50 MG tablet Take 50 mg by mouth 2 (two) times daily.        . simvastatin (ZOCOR) 20 MG tablet Take 20 mg by mouth at bedtime.        Marland Kitchen VITAMIN E PO Take 1 tablet by mouth daily. ( OTC )         Allergies  Allergen Reactions  . Plavix (Clopidogrel Bisulfate)     STOMACH  UPSTET    Review of Systems negative except from HPI and PMH  Physical Exam BP 153/71  Pulse 61  Ht 5\' 2"  (1.575 m)  Wt 120 lb (54.432 kg)  BMI 21.95 kg/m2 Well developed and well nourished in no acute distress HENT normal E scleral and icterus clear Neck Supple JVP flat; carotids brisk and full Clear to ausculation Diminished S1 Regular rate and rhythm, no murmurs gallops or rub Soft with active bowel sounds No clubbing cyanosis none Edema Alert and oriented, grossly normal motor and sensory function Skin Warm and Dry    Assessment and  Plan

## 2011-06-26 NOTE — Telephone Encounter (Signed)
(  cont'd) The patient is due to go home to [facility]. There is concern from the patient's daughter that she may be in heart failure. She has complained for the last 2-3 days of not feeling well and feeling like she is choking. Her feet are always swollen per the daughter. They do not weigh her. She has not been lying flat and is having trouble sleeping. She has been in the recliner quite a bit, but typically does not move around a whole lot. The patient says she is getting SOB at rest, and is cold and shaky per the daughter. I explained I would review with Dr. Graciela Husbands and call her back. Sherri Rad, RN, BSN   Per Dr. Graciela Husbands, he will see her today at 4:00 pm.

## 2011-06-26 NOTE — Telephone Encounter (Signed)
PT VISITING DTR AND GOING BACK HOME TOMORROW, PT IS CONGESTED AND DTR WANTS HER SEEN TODAY, DENIES CHEST PAIN/PRESSURE, OR SOB, THINKS SHE HAS CHF, PLS CALL

## 2011-06-26 NOTE — Assessment & Plan Note (Signed)
The patient's device was interrogated.  The information was reviewed. No changes were made in the programming.   As noted above in AV tachycardia was noted with electrograms most consistent with a supraventricular origin

## 2011-06-26 NOTE — Assessment & Plan Note (Signed)
She is having spells at night associated with choking palpitations and some chest pain. Interrogation of her device demonstrates a tachycardia that has been present the last couple of nights; the duration is hard because she has overridden her buffer. Her metoprolol dose was changed a couple of weeks ago. This tachycardia is a one-to-one AV tachycardia in the context of 90+ percent atrial pacing and a heart rate that is detectable in the 140-150 bin suggesting a specific tachycardia mechanism.  I think if anything to do is to resume her Lopressor 50 twice a day. I am also not a fan of digoxin and so we'll discontinue it. Her Lopressor to be further up titrated she has moderately increased hypertension

## 2011-06-26 NOTE — Assessment & Plan Note (Signed)
As above.

## 2011-06-26 NOTE — Telephone Encounter (Signed)
I spoke with the patient's daughter. She states that the patient has been here for 3 weeks

## 2011-06-26 NOTE — Assessment & Plan Note (Signed)
No detected atrial fibrillation. She is on aspirin.

## 2011-06-27 ENCOUNTER — Other Ambulatory Visit: Payer: Self-pay | Admitting: Internal Medicine

## 2011-06-27 MED ORDER — METOPROLOL TARTRATE 50 MG PO TABS: 50.0000 mg | ORAL_TABLET | Freq: Two times a day (BID) | ORAL | Status: DC

## 2011-06-27 MED ORDER — ISOSORBIDE MONONITRATE ER 30 MG PO TB24: 30.0000 mg | ORAL_TABLET | Freq: Every day | ORAL | Status: DC

## 2011-06-27 NOTE — Telephone Encounter (Signed)
done

## 2011-06-27 NOTE — Telephone Encounter (Signed)
Follow-up:    [facility] Phone: [phone].

## 2011-06-27 NOTE — Telephone Encounter (Signed)
New Problem:    Patient's daughter called in needing a refill of her isosorbide mononitrate (IMDUR) 30 MG 24 hr tablet and metoprolol (LOPRESSOR) 50 MG tablet filled at the [facility] in [place].  Please call back the phone call was dropped before more information could be given.

## 2011-07-03 ENCOUNTER — Telehealth: Payer: Self-pay | Admitting: Internal Medicine

## 2011-07-03 NOTE — Telephone Encounter (Signed)
F/u  Patient refills RX at [facility],  [address], [place], please speak with Pharmacy tech Kennyth Arnold [phone].

## 2011-07-03 NOTE — Telephone Encounter (Signed)
I spoke with Kennyth Arnold at [facility] in [place]. The patient reported to her that Dr. Graciela Husbands had told her to take her imdur twice daily. In reviewing his note, I do not see where he instructed her to do this. He did want her to resume lopressor 50 mg BID. The pharmacy will notify the patient.

## 2011-07-03 NOTE — Telephone Encounter (Signed)
Please return call to [facility] [phone], regarding clarification of instructions for Isosorbide medication.

## 2011-09-28 DIAGNOSIS — I639 Cerebral infarction, unspecified: Secondary | ICD-10-CM

## 2011-09-28 DIAGNOSIS — I82409 Acute embolism and thrombosis of unspecified deep veins of unspecified lower extremity: Secondary | ICD-10-CM

## 2011-09-28 HISTORY — DX: Acute embolism and thrombosis of unspecified deep veins of unspecified lower extremity: I82.409

## 2011-09-28 HISTORY — DX: Cerebral infarction, unspecified: I63.9

## 2011-11-03 ENCOUNTER — Telehealth: Payer: Self-pay | Admitting: Internal Medicine

## 2011-11-03 NOTE — Telephone Encounter (Signed)
Spoke with pt dtr, she is going to fax the med list so we can update her chart for dr Graciela Husbands review.

## 2011-11-03 NOTE — Telephone Encounter (Signed)
New problem:    Patient's daughter called in wanting to make Dr. Graciela Husbands her mother's primary cardiologists (has seen Dr. Graciela Husbands this year), wants Dr. Graciela Husbands to review her mother's medication list the she received while in a rehab center in [place]. Please call back.

## 2011-11-05 NOTE — Telephone Encounter (Signed)
Follow-up:    Patient's daughter called back to follow up on the call she placed on 11/03/11.  Please call back.

## 2011-11-06 ENCOUNTER — Other Ambulatory Visit (INDEPENDENT_AMBULATORY_CARE_PROVIDER_SITE_OTHER): Payer: Medicare Other

## 2011-11-06 ENCOUNTER — Other Ambulatory Visit: Payer: Self-pay | Admitting: *Deleted

## 2011-11-06 DIAGNOSIS — I4891 Unspecified atrial fibrillation: Secondary | ICD-10-CM

## 2011-11-06 MED ORDER — WARFARIN SODIUM 7.5 MG PO TABS: ORAL_TABLET | ORAL | Status: DC

## 2011-11-06 MED ORDER — ATORVASTATIN CALCIUM 10 MG PO TABS: 10.0000 mg | ORAL_TABLET | Freq: Every day | ORAL | Status: DC

## 2011-11-06 MED ORDER — FUROSEMIDE 40 MG PO TABS: 40.0000 mg | ORAL_TABLET | Freq: Every day | ORAL | Status: DC

## 2011-11-06 MED ORDER — CARVEDILOL 3.125 MG PO TABS: 3.1250 mg | ORAL_TABLET | Freq: Two times a day (BID) | ORAL | Status: DC

## 2011-11-06 MED ORDER — SPIRONOLACTONE 25 MG PO TABS: 25.0000 mg | ORAL_TABLET | Freq: Every day | ORAL | Status: DC

## 2011-11-06 MED ORDER — POTASSIUM CHLORIDE ER 10 MEQ PO TBCR: 20.0000 meq | EXTENDED_RELEASE_TABLET | Freq: Every day | ORAL | Status: DC

## 2011-11-06 MED ORDER — LISINOPRIL 5 MG PO TABS: 5.0000 mg | ORAL_TABLET | Freq: Every day | ORAL | Status: DC

## 2011-11-07 LAB — CBC WITH DIFFERENTIAL/PLATELET
Basophils Absolute: 0 10*3/uL (ref 0.0–0.1)
Eosinophils Absolute: 0.3 10*3/uL (ref 0.0–0.7)
HCT: 44.3 % (ref 36.0–46.0)
Hemoglobin: 14.6 g/dL (ref 12.0–15.0)
Lymphocytes Relative: 18.7 % (ref 12.0–46.0)
Lymphs Abs: 1.5 10*3/uL (ref 0.7–4.0)
MCHC: 32.9 g/dL (ref 30.0–36.0)
MCV: 95.5 fl (ref 78.0–100.0)
Monocytes Absolute: 0.5 10*3/uL (ref 0.1–1.0)
Neutro Abs: 5.6 10*3/uL (ref 1.4–7.7)
RDW: 16.8 % — ABNORMAL HIGH (ref 11.5–14.6)

## 2011-11-07 LAB — COMPREHENSIVE METABOLIC PANEL
ALT: 24 U/L (ref 0–35)
AST: 28 U/L (ref 0–37)
Alkaline Phosphatase: 44 U/L (ref 39–117)
Calcium: 8.8 mg/dL (ref 8.4–10.5)
Chloride: 104 mEq/L (ref 96–112)
Creatinine, Ser: 1 mg/dL (ref 0.4–1.2)

## 2011-11-07 NOTE — Telephone Encounter (Signed)
Pt seen in the office

## 2011-11-10 ENCOUNTER — Encounter (INDEPENDENT_AMBULATORY_CARE_PROVIDER_SITE_OTHER): Payer: Medicare Other | Admitting: *Deleted

## 2011-11-10 ENCOUNTER — Ambulatory Visit (INDEPENDENT_AMBULATORY_CARE_PROVIDER_SITE_OTHER): Payer: Medicare Other | Admitting: Internal Medicine

## 2011-11-10 ENCOUNTER — Encounter: Payer: Self-pay | Admitting: Internal Medicine

## 2011-11-10 ENCOUNTER — Telehealth: Payer: Self-pay | Admitting: Internal Medicine

## 2011-11-10 VITALS — BP 116/75 | HR 63 | Ht 64.0 in | Wt 111.0 lb

## 2011-11-10 DIAGNOSIS — I4891 Unspecified atrial fibrillation: Secondary | ICD-10-CM

## 2011-11-10 DIAGNOSIS — I82409 Acute embolism and thrombosis of unspecified deep veins of unspecified lower extremity: Secondary | ICD-10-CM | POA: Insufficient documentation

## 2011-11-10 DIAGNOSIS — I10 Essential (primary) hypertension: Secondary | ICD-10-CM

## 2011-11-10 DIAGNOSIS — R0989 Other specified symptoms and signs involving the circulatory and respiratory systems: Secondary | ICD-10-CM

## 2011-11-10 DIAGNOSIS — I5032 Chronic diastolic (congestive) heart failure: Secondary | ICD-10-CM | POA: Insufficient documentation

## 2011-11-10 DIAGNOSIS — I5022 Chronic systolic (congestive) heart failure: Secondary | ICD-10-CM

## 2011-11-10 LAB — COMPREHENSIVE METABOLIC PANEL
ALT: 18 U/L (ref 0–35)
AST: 26 U/L (ref 0–37)
Alkaline Phosphatase: 42 U/L (ref 39–117)
BUN: 27 mg/dL — ABNORMAL HIGH (ref 6–23)
Calcium: 9.2 mg/dL (ref 8.4–10.5)
Chloride: 101 mEq/L (ref 96–112)
Creatinine, Ser: 1.1 mg/dL (ref 0.4–1.2)
Sodium: 138 mEq/L (ref 135–145)
Total Bilirubin: 0.6 mg/dL (ref 0.3–1.2)
Total Protein: 6.6 g/dL (ref 6.0–8.3)

## 2011-11-10 LAB — PACEMAKER DEVICE OBSERVATION
AL AMPLITUDE: 2.9 mv
AL IMPEDENCE PM: 512 Ohm
BAMS-0001: 150 {beats}/min
BATTERY VOLTAGE: 3 V
RV LEAD AMPLITUDE: 20 mv

## 2011-11-10 NOTE — Assessment & Plan Note (Signed)
Patient has history of DVT. We will try to obtain records from [place]. We will continue on Coumadin now she also has a history of atrial fibrillation

## 2011-11-10 NOTE — Telephone Encounter (Signed)
2 release forms faxed One to  [facility] @ [phone]/[facility] @ [phone]   > records were Received From [facility] these Were  Given to Cory/Klein  11/10/11/KM

## 2011-11-10 NOTE — Assessment & Plan Note (Signed)
Euvolemic; contiue meds apart from stoppages noted above

## 2011-11-10 NOTE — Assessment & Plan Note (Signed)
Well ocntrolled  without history of ischemic heart disease, will stop her nitroglycerin.; She is also taking to beta blocker so will stop coreg

## 2011-11-10 NOTE — Progress Notes (Signed)
Patient has no care team.   HPI  Rachel Shah is a 78 y.o. female Seen to establish long-term care. She was recently in a rehabilitation facility where she last "AMA". She was on anticoagulation at that time apparently for DVT. She had been transferred from it local hospital in [facility] for extremely high blood pressure and "irregular heartbeat". Records from these 2 institutions are pending. Currently she denies chest pain or shortness of breath she does have some peripheral edema but it is improved    She has a history of atrial fibrillation and tachybradycardia syndrome and underwent pacemaker implantation about a year ago.       Past Medical History  Diagnosis Date  . Atrial flutter   . Supraventricular tachycardia   . Tachy-brady syndrome     post Medtronic revealed pulse generator dual lead pacemaker  . CHF (congestive heart failure)     Acute on chronic diastolic congestive heart failure  . Angina at rest   . Transient ischemic attack 2007    Remote history of  . Fibroids     History of uterine fibroids  . Thrombocytopenia     Mild  . Abnormal thyroid function test     Mildly abnormal thyroid function studies with a TSH of 7.984 (upper limit of normal 4.5), free T4 within normal limits at 1.21 and T3  slightly low at 69.2 (lower limit of normal 80), follow up with primary care  . Hypertension     Past Surgical History  Procedure Date  . Cardiac pacemaker placement     Current Outpatient Prescriptions  Medication Sig Dispense Refill  . ACTONEL 150 MG tablet Take 1 tablet by mouth Daily.      Marland Kitchen aspirin 81 MG tablet Take 81 mg by mouth daily.        Marland Kitchen atorvastatin (LIPITOR) 10 MG tablet Take 1 tablet (10 mg total) by mouth daily.  90 tablet  3  . carvedilol (COREG) 3.125 MG tablet Take 1 tablet (3.125 mg total) by mouth 2 (two) times daily with a meal.  180 tablet  3  . digoxin (LANOXIN) 0.125 MG tablet Take by mouth. 1/2 tablet daily       . furosemide (LASIX)  40 MG tablet Take 1 tablet (40 mg total) by mouth daily.  90 tablet  3  . isosorbide mononitrate (IMDUR) 30 MG 24 hr tablet Take 1 tablet (30 mg total) by mouth daily.  90 tablet  3  . lisinopril (ZESTRIL) 5 MG tablet Take 1 tablet (5 mg total) by mouth daily.  90 tablet  3  . potassium chloride (K-DUR) 10 MEQ tablet Take 2 tablets (20 mEq total) by mouth daily.  180 tablet  3  . spironolactone (ALDACTONE) 25 MG tablet Take 1 tablet (25 mg total) by mouth daily.  90 tablet  3  . VITAMIN E PO Take 1 tablet by mouth daily. ( OTC )       . warfarin (COUMADIN) 7.5 MG tablet As directed by the coumadin clinic.  30 tablet  0  . metoprolol (LOPRESSOR) 50 MG tablet Take 1 tablet (50 mg total) by mouth 2 (two) times daily.  180 tablet  3    Allergies  Allergen Reactions  . Plavix (Clopidogrel Bisulfate)     STOMACH UPSTET    Review of Systems negative except from HPI and PMH  Physical Exam BP 116/75  Pulse 63  Ht 5\' 4"  (1.626 m)  Wt 111 lb (50.349 kg)  BMI 19.05 kg/m2 Well developed and well nourished in no acute distress HENT normal E scleral and icterus clear Neck Supple JVP flat; carotids brisk and full Clear to ausculation  Regular rate and rhythm, no murmurs gallops or rub Soft with active bowel sounds No clubbing cyanosis r>L foot Edema Alert and oriented,  very hard of hearing and a head tremor   atrial paced rhythm with intervals 0.20/0.12 /4.3 left axis otherwise normal   Assessment and  Plan

## 2011-11-10 NOTE — Patient Instructions (Addendum)
LABS TODAY:  CMET & DIG Level  STOP:  Aspirin, Coreg and Imdur.  Your physician recommends that you schedule a follow-up appointment in: 3 weeks with Dr. Graciela Husbands.

## 2011-11-10 NOTE — Assessment & Plan Note (Addendum)
Paroxysmal atrial fibrillation apparently. We'll await pacemaker interrogation. She apparently has decreased left ventricular function is well maintained on digoxin and check a dig level today.  I no significant amounts of intercurrent atrial fibrillation are detedcted

## 2011-11-18 ENCOUNTER — Telehealth: Payer: Self-pay | Admitting: Internal Medicine

## 2011-11-18 NOTE — Telephone Encounter (Signed)
Pt's daughter stating that pt is not drinking and barely urinating.  Unable to get out of bed.  Advised that pt may very well be dehydrated and needing IV fluids.  Daughter states that she will proceed to ER if symptoms worsen.

## 2011-11-18 NOTE — Telephone Encounter (Signed)
plz return call to pt daughter Tobey Grim [phone] regarding pt medical care.  Pt hasnt urinated in the last 48 hours, please return call to advise.

## 2011-11-20 ENCOUNTER — Ambulatory Visit: Payer: Medicare Other | Admitting: Family Medicine

## 2011-11-24 ENCOUNTER — Telehealth: Payer: Self-pay | Admitting: Internal Medicine

## 2011-11-24 NOTE — Telephone Encounter (Signed)
Spoke with pt's daughter. Pt's daughter states with minimal exertion pt has to stop because she is SOB and is gasping for breath. This is not a new problem for her.  Pt's daughter asking if Dr Graciela Husbands thinks oxygen might be helpful for this. I will forward to Dr Graciela Husbands for review and recommendations.

## 2011-11-24 NOTE — Telephone Encounter (Signed)
Pt's dtr calling requesting order for oxygen, pt gets very winded when walking and has to stop to catch her breath, pls call

## 2011-11-28 ENCOUNTER — Telehealth: Payer: Self-pay | Admitting: Internal Medicine

## 2011-11-28 DIAGNOSIS — K661 Hemoperitoneum: Secondary | ICD-10-CM

## 2011-11-28 HISTORY — DX: Hemoperitoneum: K66.1

## 2011-11-28 NOTE — Telephone Encounter (Signed)
Pt shortness of breath is getting worse and she is having some confusion and daughter needs to know what can she do and dose she need to be seen sooner

## 2011-11-28 NOTE — Telephone Encounter (Signed)
Per Dr. Graciela Husbands, Pt will see Tereso Newcomer on Monday, 12/01/2011.  Pt's daughter agreed.

## 2011-12-01 ENCOUNTER — Inpatient Hospital Stay (HOSPITAL_COMMUNITY)
Admission: AD | Admit: 2011-12-01 | Discharge: 2011-12-08 | DRG: 393 | Disposition: A | Discharge status: Home / Self Care | Payer: Medicare Other | Source: Ambulatory Visit | Attending: Internal Medicine | Admitting: Internal Medicine

## 2011-12-01 ENCOUNTER — Other Ambulatory Visit: Payer: Self-pay | Admitting: Internal Medicine

## 2011-12-01 ENCOUNTER — Observation Stay (HOSPITAL_COMMUNITY): Payer: Medicare Other

## 2011-12-01 ENCOUNTER — Ambulatory Visit (INDEPENDENT_AMBULATORY_CARE_PROVIDER_SITE_OTHER): Payer: Medicare Other | Admitting: Physician Assistant

## 2011-12-01 ENCOUNTER — Encounter: Payer: Self-pay | Admitting: Physician Assistant

## 2011-12-01 VITALS — BP 114/56 | HR 73 | Temp 97.0°F | Ht 60.0 in | Wt 113.0 lb

## 2011-12-01 DIAGNOSIS — I5032 Chronic diastolic (congestive) heart failure: Secondary | ICD-10-CM | POA: Diagnosis present

## 2011-12-01 DIAGNOSIS — IMO0002 Reserved for concepts with insufficient information to code with codable children: Secondary | ICD-10-CM

## 2011-12-01 DIAGNOSIS — Z8673 Personal history of transient ischemic attack (TIA), and cerebral infarction without residual deficits: Secondary | ICD-10-CM

## 2011-12-01 DIAGNOSIS — Z950 Presence of cardiac pacemaker: Secondary | ICD-10-CM

## 2011-12-01 DIAGNOSIS — I82409 Acute embolism and thrombosis of unspecified deep veins of unspecified lower extremity: Secondary | ICD-10-CM

## 2011-12-01 DIAGNOSIS — R627 Adult failure to thrive: Secondary | ICD-10-CM | POA: Diagnosis present

## 2011-12-01 DIAGNOSIS — Z7901 Long term (current) use of anticoagulants: Secondary | ICD-10-CM

## 2011-12-01 DIAGNOSIS — I4891 Unspecified atrial fibrillation: Secondary | ICD-10-CM

## 2011-12-01 DIAGNOSIS — I5022 Chronic systolic (congestive) heart failure: Secondary | ICD-10-CM

## 2011-12-01 DIAGNOSIS — I509 Heart failure, unspecified: Secondary | ICD-10-CM | POA: Diagnosis present

## 2011-12-01 DIAGNOSIS — D62 Acute posthemorrhagic anemia: Secondary | ICD-10-CM | POA: Diagnosis present

## 2011-12-01 DIAGNOSIS — R0600 Dyspnea, unspecified: Secondary | ICD-10-CM | POA: Diagnosis present

## 2011-12-01 DIAGNOSIS — D6832 Hemorrhagic disorder due to extrinsic circulating anticoagulants: Secondary | ICD-10-CM

## 2011-12-01 DIAGNOSIS — R6811 Excessive crying of infant (baby): Secondary | ICD-10-CM | POA: Diagnosis present

## 2011-12-01 DIAGNOSIS — R042 Hemoptysis: Secondary | ICD-10-CM | POA: Diagnosis present

## 2011-12-01 DIAGNOSIS — Z79899 Other long term (current) drug therapy: Secondary | ICD-10-CM

## 2011-12-01 DIAGNOSIS — I495 Sick sinus syndrome: Secondary | ICD-10-CM | POA: Diagnosis present

## 2011-12-01 DIAGNOSIS — R578 Other shock: Secondary | ICD-10-CM | POA: Diagnosis present

## 2011-12-01 DIAGNOSIS — K661 Hemoperitoneum: Principal | ICD-10-CM | POA: Diagnosis present

## 2011-12-01 DIAGNOSIS — I471 Supraventricular tachycardia: Secondary | ICD-10-CM | POA: Diagnosis not present

## 2011-12-01 DIAGNOSIS — N289 Disorder of kidney and ureter, unspecified: Secondary | ICD-10-CM | POA: Diagnosis present

## 2011-12-01 DIAGNOSIS — I5033 Acute on chronic diastolic (congestive) heart failure: Secondary | ICD-10-CM

## 2011-12-01 DIAGNOSIS — T45515A Adverse effect of anticoagulants, initial encounter: Secondary | ICD-10-CM | POA: Diagnosis present

## 2011-12-01 DIAGNOSIS — I10 Essential (primary) hypertension: Secondary | ICD-10-CM

## 2011-12-01 DIAGNOSIS — R109 Unspecified abdominal pain: Secondary | ICD-10-CM

## 2011-12-01 DIAGNOSIS — R079 Chest pain, unspecified: Secondary | ICD-10-CM

## 2011-12-01 HISTORY — DX: Gastrointestinal hemorrhage, unspecified: K92.2

## 2011-12-01 HISTORY — DX: Unspecified hearing loss, unspecified ear: H91.90

## 2011-12-01 HISTORY — DX: Pure hypercholesterolemia, unspecified: E78.00

## 2011-12-01 LAB — COMPREHENSIVE METABOLIC PANEL
ALT: 17 U/L (ref 0–35)
AST: 25 U/L (ref 0–37)
Albumin: 3.3 g/dL — ABNORMAL LOW (ref 3.5–5.2)
Alkaline Phosphatase: 54 U/L (ref 39–117)
CO2: 26 mEq/L (ref 19–32)
Chloride: 97 mEq/L (ref 96–112)
GFR calc non Af Amer: 30 mL/min — ABNORMAL LOW (ref >90)
Potassium: 4 mEq/L (ref 3.5–5.1)
Sodium: 135 mEq/L (ref 135–145)
Total Bilirubin: 1.6 mg/dL — ABNORMAL HIGH (ref 0.3–1.2)

## 2011-12-01 LAB — CBC WITH DIFFERENTIAL/PLATELET
Basophils Absolute: 0 10*3/uL (ref 0.0–0.1)
Eosinophils Relative: 1 % (ref 0–5)
Lymphocytes Relative: 7 % — ABNORMAL LOW (ref 12–46)
Lymphs Abs: 0.8 10*3/uL (ref 0.7–4.0)
MCV: 93.7 fL (ref 78.0–100.0)
Neutro Abs: 8.4 10*3/uL — ABNORMAL HIGH (ref 1.7–7.7)
Neutrophils Relative %: 83 % — ABNORMAL HIGH (ref 43–77)
Platelets: 171 10*3/uL (ref 150–400)
RBC: 4.27 MIL/uL (ref 3.87–5.11)
RDW: 15.8 % — ABNORMAL HIGH (ref 11.5–15.5)
WBC: 10.1 10*3/uL (ref 4.0–10.5)

## 2011-12-01 LAB — PROTIME-INR
INR: >10 (ref 0.00–1.49)
INR: >10 (ref 0.00–1.49)
Prothrombin Time: >90 seconds — ABNORMAL HIGH (ref 11.6–15.2)

## 2011-12-01 LAB — DIGOXIN LEVEL: Digoxin Level: <0.3 ng/mL — ABNORMAL LOW (ref 0.8–2.0)

## 2011-12-01 LAB — TSH: TSH: 5.413 u[IU]/mL — ABNORMAL HIGH (ref 0.350–4.500)

## 2011-12-01 LAB — TROPONIN I: Troponin I: <0.3 ng/mL (ref <0.30)

## 2011-12-01 LAB — APTT: aPTT: >200 seconds (ref 24–37)

## 2011-12-01 MED ORDER — WARFARIN SODIUM 7.5 MG PO TABS
7.5000 mg | ORAL_TABLET | Freq: Every day | ORAL | Status: DC
  Filled 2011-12-01: qty 1

## 2011-12-01 MED ORDER — WARFARIN - PHYSICIAN DOSING INPATIENT: Freq: Every day | Status: DC

## 2011-12-01 MED ORDER — SODIUM CHLORIDE 0.9 % IJ SOLN: 3.0000 mL | INTRAMUSCULAR | Status: DC | PRN

## 2011-12-01 MED ORDER — SODIUM CHLORIDE 0.9 % IV SOLN: 250.0000 mL | INTRAVENOUS | Status: DC | PRN

## 2011-12-01 MED ORDER — SPIRONOLACTONE 25 MG PO TABS
25.0000 mg | ORAL_TABLET | Freq: Every day | ORAL | Status: DC
  Administered 2011-12-02: 25 mg via ORAL
  Filled 2011-12-01: qty 1

## 2011-12-01 MED ORDER — ATORVASTATIN CALCIUM 10 MG PO TABS
10.0000 mg | ORAL_TABLET | Freq: Every day | ORAL | Status: DC
  Filled 2011-12-01 (×2): qty 1

## 2011-12-01 MED ORDER — POTASSIUM CHLORIDE ER 10 MEQ PO TBCR
20.0000 meq | EXTENDED_RELEASE_TABLET | Freq: Every day | ORAL | Status: DC
  Administered 2011-12-02: 20 meq via ORAL
  Filled 2011-12-01 (×2): qty 2

## 2011-12-01 MED ORDER — ONDANSETRON HCL 4 MG/2ML IJ SOLN
4.0000 mg | Freq: Four times a day (QID) | INTRAMUSCULAR | Status: DC | PRN
  Administered 2011-12-01: 4 mg via INTRAVENOUS
  Filled 2011-12-01: qty 2

## 2011-12-01 MED ORDER — DIGOXIN 0.0625 MG HALF TABLET
0.0625 mg | ORAL_TABLET | Freq: Every day | ORAL | Status: DC
  Filled 2011-12-01 (×2): qty 1

## 2011-12-01 MED ORDER — ACETAMINOPHEN 325 MG PO TABS: 650.0000 mg | ORAL_TABLET | ORAL | Status: DC | PRN

## 2011-12-01 MED ORDER — SODIUM CHLORIDE 0.9 % IJ SOLN
3.0000 mL | Freq: Two times a day (BID) | INTRAMUSCULAR | Status: DC
  Administered 2011-12-01 – 2011-12-07 (×13): 3 mL via INTRAVENOUS

## 2011-12-01 MED ORDER — NITROGLYCERIN 0.4 MG SL SUBL: 0.4000 mg | SUBLINGUAL_TABLET | SUBLINGUAL | Status: DC | PRN

## 2011-12-01 MED ORDER — METOPROLOL TARTRATE 50 MG PO TABS
50.0000 mg | ORAL_TABLET | Freq: Two times a day (BID) | ORAL | Status: DC
  Administered 2011-12-01 – 2011-12-02 (×2): 50 mg via ORAL
  Filled 2011-12-01 (×3): qty 1

## 2011-12-01 MED ORDER — LISINOPRIL 5 MG PO TABS
5.0000 mg | ORAL_TABLET | Freq: Every day | ORAL | Status: DC
  Filled 2011-12-01: qty 1

## 2011-12-01 MED ORDER — PHYTONADIONE 5 MG PO TABS
5.0000 mg | ORAL_TABLET | Freq: Once | ORAL | Status: AC
  Administered 2011-12-01: 5 mg via ORAL
  Filled 2011-12-01: qty 1

## 2011-12-01 MED ORDER — FUROSEMIDE 40 MG PO TABS
40.0000 mg | ORAL_TABLET | Freq: Every day | ORAL | Status: DC
  Administered 2011-12-02: 40 mg via ORAL
  Filled 2011-12-01 (×2): qty 1

## 2011-12-01 NOTE — H&P (Signed)
Admission History and Physical  Date:  12/01/2011   Name:  Rachel Shah   DOB:  08/08/1910   MRN:  [id number]  PCP:  Eustaquio Boyden, MD  Primary Cardiologist/Primary Electrophysiologist:  Dr. Sherryl Manges    History of Present Illness: [place] Shah is a 78 y.o. female who returns for evaluation of dyspnea.  The patient previously lived in [place]. Her daughter lives in [place] and she is a patient of Dr. Odessa Fleming. She was visiting in 5/12 when she developed diastolic heart failure in the setting of atrial fibrillation with rapid ventricular rate.  She developed tachybradycardia syndrome while in the hospital and underwent pacemaker implantation. In the past, she has not been felt to be a coumadin candidate.  She also has a hx of HTN, TIA and angina. She reports problems with angina for the last 5 years.  She denies ever having any cardiac catheterization or stress testing. Echo in 5/12 demonstrated moderate LVH, EF 55-60%, mild AI, mild-moderate LAE, lipomatous hypertrophy of the atrial septum.  We have seen her in the past here and have requested records from her cardiologist in [place].  But, to my knowledge, we have not yet received any records.    She saw Dr. Graciela Husbands in 5/13. She was having spells of nocturnal dyspnea. Interrogation of her device demonstrated tachycardia with heart rates in the 140s -150s. He suggested restarting her Lopressor at that time. She saw Dr. Graciela Husbands last on 11/10/11. She was reportedly admitted to [facility] in [place] in [place], 2013.  I have no records.  Her daughter reports that she was diuresed for systolic CHF ("EF 30%").  She was put on coumadin for DVT.  She was at a rehab facility but left AMA.  She is now living in [place] permanently with her daughter.  She has been having progressively worsening DOE. She describes Class IIIb symptoms.  Sleeps on 2 pillows.  No PND.  No significant edema.  No syncope.  Overall, she is demonstrating a picture  of failure to thrive.  She is not eating.  She notes vomiting and diarrhea.  She has had some fever (no temp recorded).  She notes a cough. Sputum has been blood-tinged.  Labs (10/13):  K 5.2, creatinine 1.1, ALT 18, Hgb 14.6  Wt Readings from Last 3 Encounters:  12/01/11 113 lb (51.256 kg)  11/10/11 111 lb (50.349 kg)  06/26/11 120 lb (54.432 kg)     Past Medical History  Diagnosis Date  . Atrial flutter   . Supraventricular tachycardia   . Tachy-brady syndrome     post Medtronic revealed pulse generator dual lead pacemaker  . CHF (congestive heart failure)     a. hx of diastolic CHF;  b. recent dx with systolic CHF  . Angina at rest   . Transient ischemic attack 2007    Remote history of  . Fibroids     History of uterine fibroids  . Thrombocytopenia     Mild  . Abnormal thyroid function test     Mildly abnormal thyroid function studies with a TSH of 7.984 (upper limit of normal 4.5), free T4 within normal limits at 1.21 and T3  slightly low at 69.2 (lower limit of normal 80), follow up with primary care  . Hypertension    Past Surgical History:  The patient  has past surgical history that includes Cardiac pacemaker placement.    Current Outpatient Prescriptions  Medication Sig Dispense Refill  . ACTONEL 150 MG tablet Take 1 tablet  by mouth Daily.      Marland Kitchen atorvastatin (LIPITOR) 10 MG tablet Take 1 tablet (10 mg total) by mouth daily.  90 tablet  3  . digoxin (LANOXIN) 0.125 MG tablet Take by mouth. 1/2 tablet daily       . furosemide (LASIX) 40 MG tablet Take 1 tablet (40 mg total) by mouth daily.  90 tablet  3  . lisinopril (ZESTRIL) 5 MG tablet Take 1 tablet (5 mg total) by mouth daily.  90 tablet  3  . metoprolol (LOPRESSOR) 50 MG tablet Take 1 tablet (50 mg total) by mouth 2 (two) times daily.  180 tablet  3  . potassium chloride (K-DUR) 10 MEQ tablet Take 2 tablets (20 mEq total) by mouth daily.  180 tablet  3  . spironolactone (ALDACTONE) 25 MG tablet Take 1 tablet (25  mg total) by mouth daily.  90 tablet  3  . VITAMIN E PO Take 1 tablet by mouth daily. ( OTC )       . warfarin (COUMADIN) 7.5 MG tablet As directed by the coumadin clinic.  30 tablet  0    Allergies: Allergies  Allergen Reactions  . Plavix (Clopidogrel Bisulfate)     STOMACH UPSTET    Social History:  The patient  reports that she has never smoked. She does not have any smokeless tobacco history on file. She reports that she does not drink alcohol or use illicit drugs.   Family History:  The patient's family history includes Cancer in her sister and Other in an unspecified family member.   ROS:  Please see the history of present illness.   Right eye blurry since admission to hospital in 09/2011.  Daughter notes significant confusion.  All other systems reviewed and negative.   PHYSICAL EXAM: VS:  BP 114/56  Pulse 73  Ht 5' (1.524 m)  Wt 113 lb (51.256 kg)  BMI 22.07 kg/m2 Elderly female noticeably dyspneic with talking HEENT: normal Neck: no JVD Cardiac:  normal S1, S2; RRR; no murmur; +S3 Lungs:  clear to auscultation bilaterally, no wheezing, rhonchi or rales Abd: soft, nontender, no hepatomegaly Ext: no edema Skin: warm and dry Neuro:  CNs 2-12 intact, no focal abnormalities noted  EKG:  NSR, HR 73, LAFB, no change from prior   ASSESSMENT AND PLAN:  1. Failure to Thrive:   Patient presents to the office today with significant symptoms suggestive of failure to thrive. She is dyspneic with just minimal activity and talking. She is not eating. She has significant vomiting and diarrhea. She has also had a cough with blood-tinged sputum. She is not febrile in the office today. Her oxygenation is good on room air. I suspect her symptoms are multifactorial at this point. I have recommended admission to the hospital for further evaluation and treatment. I reviewed this with Dr. Verne Carrow who agreed. He has also seen the patient today.  Will admit for further evaluation.   Check BMET, LFTs, CBC, TSH, digoxin level, cardiac markers.  2. Chronic Systolic CHF:   Her blood pressure is stable. She is not tachycardic. However, her symptoms may be explained by low output failure.  I will obtain a followup echocardiogram. We will also obtain a chest x-ray and a BNP. She may benefit from seeing the CHF service.  3. Atrial Fibrillation:   She is in NSR.  She is on coumadin.  She has not had her INR checked since she moved here in early October.  Her INR  will be checked and will have pharmacy manage this.  Check Digoxin level.    4. DVT:   As noted, will check her INR.    5. Hemoptysis:  Check CBC and CXR.  6. Disposition:   She has advanced age and symptoms of failure to thrive.  She may ultimately need Palliative Care Consult depending on results of testing and management.    Signed, Tereso Newcomer, PA-C  2:31 PM 12/01/2011     I have personally seen and examined this patient with Tereso Newcomer, PA-C. I agree with the assessment and plan as outlined above. She will be admitted for workup of FTT. Plan echo, labs, CXR.   Luisdavid Hamblin 6:26 PM 12/01/2011

## 2011-12-01 NOTE — Progress Notes (Signed)
RN called re: elevated INR (>10). Check repeat stat PT/INR. Will hold Coumadin. H/H from CBC today stable.  Will check FOBT. Will add PO vitamin K after discussing with Dr. Antoine Poche. Blood-tinged sputum noted H&P. Will continue to monitor for signs of bleeding.    Jacqulyn Bath, PA-C 12/01/2011 6:58 PM

## 2011-12-01 NOTE — Progress Notes (Signed)
[address], [address] [place], [place]  [address] Phone: [phone], Fax:  [phone]  Date:  12/01/2011   Name:  [place] Shah   DOB:  August 06, 1910   MRN:  [id number]  PCP:  Eustaquio Boyden, MD  Primary Cardiologist/Primary Electrophysiologist:  Dr. Sherryl Manges    History of Present Illness: [place] Shah is a 78 y.o. female who returns for evaluation of dyspnea.  The patient previously lived in [place]. Her daughter lives in [place] and she is a patient of Dr. Odessa Fleming. She was visiting in 5/12 when she developed diastolic heart failure in the setting of atrial fibrillation with rapid ventricular rate.  She developed tachybradycardia syndrome while in the hospital and underwent pacemaker implantation. In the past, she has not been felt to be a coumadin candidate.  She also has a hx of HTN, TIA and angina. She reports problems with angina for the last 5 years.  She denies ever having any cardiac catheterization or stress testing. Echo in 5/12 demonstrated moderate LVH, EF 55-60%, mild AI, mild-moderate LAE, lipomatous hypertrophy of the atrial septum.  We have seen her in the past here and have requested records from her cardiologist in [place].  But, to my knowledge, we have not yet received any records.    She saw Dr. Graciela Husbands in 5/13. She was having spells of nocturnal dyspnea. Interrogation of her device demonstrated tachycardia with heart rates in the 140s -150s. He suggested restarting her Lopressor at that time. She saw Dr. Graciela Husbands last on 11/10/11. She was reportedly admitted to [facility] in [place] in September, 2013.  I have no records.  Her daughter reports that she was diuresed for systolic CHF ("EF 30%").  She was put on coumadin for DVT.  She was at a rehab facility but left AMA.  She is now living in [place] permanently with her daughter.  She has been having progressively worsening DOE. She describes Class IIIb symptoms.  Sleeps on 2 pillows.  No  PND.  No significant edema.  No syncope.  Overall, she is demonstrating a picture of failure to thrive.  She is not eating.  She notes vomiting and diarrhea.  She has had some fever (no temp recorded).  She notes a cough. Sputum has been blood-tinged.  Labs (10/13):  K 5.2, creatinine 1.1, ALT 18, Hgb 14.6  Wt Readings from Last 3 Encounters:  12/01/11 113 lb (51.256 kg)  11/10/11 111 lb (50.349 kg)  06/26/11 120 lb (54.432 kg)     Past Medical History  Diagnosis Date  . Atrial flutter   . Supraventricular tachycardia   . Tachy-brady syndrome     post Medtr[employer] revealed pulse generator dual lead pacemaker  . CHF (congestive heart failure)     a. hx of diastolic CHF;  b. recent dx with systolic CHF  . Angina at rest   . Transient ischemic attack 2007    Remote history of  . Fibroids     History of uterine fibroids  . Thrombocytopenia     Mild  . Abnormal thyroid function test     Mildly abnormal thyroid function studies with a TSH of 7.984 (upper limit of normal 4.5), free T4 within normal limits at 1.21 and T3  slightly low at 69.2 (lower limit of normal 80), follow up with primary care  . Hypertension    Past Surgical History:  The patient  has past surgical history that includes Cardiac pacemaker placement.    Current Outpatient Prescriptions  Medication Sig Dispense Refill  . ACTONEL 150 MG tablet Take 1 tablet by mouth Daily.      Shah Kitchen atorvastatin (LIPITOR) 10 MG tablet Take 1 tablet (10 mg total) by mouth daily.  90 tablet  3  . digoxin (LANOXIN) 0.125 MG tablet Take by mouth. 1/2 tablet daily       . furosemide (LASIX) 40 MG tablet Take 1 tablet (40 mg total) by mouth daily.  90 tablet  3  . lisinopril (ZESTRIL) 5 MG tablet Take 1 tablet (5 mg total) by mouth daily.  90 tablet  3  . metoprolol (LOPRESSOR) 50 MG tablet Take 1 tablet (50 mg total) by mouth 2 (two) times daily.  180 tablet  3  . potassium chloride (K-DUR) 10 MEQ tablet Take 2 tablets (20 mEq total) by mouth  daily.  180 tablet  3  . spironolactone (ALDACTONE) 25 MG tablet Take 1 tablet (25 mg total) by mouth daily.  90 tablet  3  . VITAMIN E PO Take 1 tablet by mouth daily. ( OTC )       . warfarin (COUMADIN) 7.5 MG tablet As directed by the coumadin clinic.  30 tablet  0    Allergies: Allergies  Allergen Reactions  . Plavix (Clopidogrel Bisulfate)     STOMACH UPSTET    Social History:  The patient  reports that she has never smoked. She does not have any smokeless tobacco history on file. She reports that she does not drink alcohol or use illicit drugs.   Family History:  The patient's family history includes Cancer in her sister and Other in an unspecified family member.   ROS:  Please see the history of present illness.   Right eye blurry since admission to hospital in 09/2011.  Daughter notes significant confusion.  All other systems reviewed and negative.   PHYSICAL EXAM: VS:  BP 114/56  Pulse 73  Ht 5' (1.524 m)  Wt 113 lb (51.256 kg)  BMI 22.07 kg/m2 Elderly female noticeably dyspneic with talking HEENT: normal Neck: no JVD Cardiac:  normal S1, S2; RRR; no murmur; +S3 Lungs:  clear to auscultation bilaterally, no wheezing, rhonchi or rales Abd: soft, nontender, no hepatomegaly Ext: no edema Skin: warm and dry Neuro:  CNs 2-12 intact, no focal abnormalities noted  EKG:  NSR, HR 73, LAFB, no change from prior   ASSESSMENT AND PLAN:  1. Failure to Thrive:   Patient presents to the office today with significant symptoms suggestive of failure to thrive. She is dyspneic with just minimal activity and talking. She is not eating. She has significant vomiting and diarrhea. She has also had a cough with blood-tinged sputum. She is not febrile in the office today. Her oxygenation is good on room air. I suspect her symptoms are multifactorial at this point. I have recommended admission to the hospital for further evaluation and treatment. I reviewed this with Dr. Verne Carrow who  agreed. He has also seen the patient today.  Will admit for further evaluation.  Check BMET, LFTs, CBC, TSH, digoxin level, cardiac markers.  2. Chronic Systolic CHF:   Her blood pressure is stable. She is not tachycardic. However, her symptoms may be explained by low output failure.  I will obtain a followup echocardiogram. We will also obtain a chest x-ray and a BNP. She may benefit from seeing the CHF service.  3. Atrial Fibrillation:   She is in NSR.  She is on coumadin.  She has not had  her INR checked since she moved here in early October.  Her INR will be checked and will have pharmacy manage this.  Check Digoxin level.    4. DVT:   As noted, will check her INR.    5. Hemoptysis:  Check CBC and CXR.  6. Disposition:   She has advanced age and symptoms of failure to thrive.  She may ultimately need Palliative Care Consult depending on results of testing and management.    Signed, Tereso Newcomer, PA-C  2:31 PM 12/01/2011

## 2011-12-01 NOTE — Patient Instructions (Addendum)
PT ADMITTED TO 2000 UNIT

## 2011-12-01 NOTE — Progress Notes (Signed)
CRITICAL VALUE ALERT  Critical value received:  inr >10  Date of notification:  12/01/2011  Time of notification:  18:45  Critical value read back:yes  Nurse who received alert:  Edrick Oh  MD notified (1st page):  18:45  Time of first page:  18:45  MD notified (2nd page):  Time of second page:  Responding MD:  Malena Edman  Time MD responded:  18:48

## 2011-12-02 ENCOUNTER — Encounter (HOSPITAL_COMMUNITY): Payer: Self-pay | Admitting: General Practice

## 2011-12-02 ENCOUNTER — Inpatient Hospital Stay (HOSPITAL_COMMUNITY): Payer: Medicare Other

## 2011-12-02 DIAGNOSIS — I4891 Unspecified atrial fibrillation: Secondary | ICD-10-CM

## 2011-12-02 DIAGNOSIS — R0602 Shortness of breath: Secondary | ICD-10-CM

## 2011-12-02 DIAGNOSIS — K661 Hemoperitoneum: Secondary | ICD-10-CM

## 2011-12-02 DIAGNOSIS — I5022 Chronic systolic (congestive) heart failure: Secondary | ICD-10-CM

## 2011-12-02 DIAGNOSIS — R109 Unspecified abdominal pain: Secondary | ICD-10-CM | POA: Diagnosis not present

## 2011-12-02 DIAGNOSIS — I10 Essential (primary) hypertension: Secondary | ICD-10-CM

## 2011-12-02 LAB — PROTIME-INR
INR: 2.75 — ABNORMAL HIGH (ref 0.00–1.49)
Prothrombin Time: >90 seconds — ABNORMAL HIGH (ref 11.6–15.2)
Prothrombin Time: 27.7 seconds — ABNORMAL HIGH (ref 11.6–15.2)

## 2011-12-02 LAB — BASIC METABOLIC PANEL
CO2: 28 mEq/L (ref 19–32)
Calcium: 9 mg/dL (ref 8.4–10.5)
Chloride: 99 mEq/L (ref 96–112)
Creatinine, Ser: 1.46 mg/dL — ABNORMAL HIGH (ref 0.50–1.10)
Glucose, Bld: 130 mg/dL — ABNORMAL HIGH (ref 70–99)

## 2011-12-02 LAB — DIGOXIN LEVEL: Digoxin Level: <0.3 ng/mL — ABNORMAL LOW (ref 0.8–2.0)

## 2011-12-02 MED ORDER — IOHEXOL 300 MG/ML  SOLN
20.0000 mL | INTRAMUSCULAR | Status: AC
  Administered 2011-12-02 (×2): 20 mL via ORAL

## 2011-12-02 MED ORDER — SODIUM CHLORIDE 0.9 % IV BOLUS (SEPSIS): 500.0000 mL | Freq: Once | INTRAVENOUS | Status: DC

## 2011-12-02 MED ORDER — IOHEXOL 300 MG/ML  SOLN
75.0000 mL | Freq: Once | INTRAMUSCULAR | Status: AC | PRN
  Administered 2011-12-02: 75 mL via INTRAVENOUS

## 2011-12-02 MED ORDER — SODIUM CHLORIDE 0.9 % IV SOLN: 250.0000 mL | INTRAVENOUS | Status: DC | PRN

## 2011-12-02 MED ORDER — METOPROLOL TARTRATE 12.5 MG HALF TABLET
12.5000 mg | ORAL_TABLET | Freq: Two times a day (BID) | ORAL | Status: DC
  Filled 2011-12-02 (×3): qty 1

## 2011-12-02 MED ORDER — VITAMIN K1 10 MG/ML IJ SOLN
2.0000 mg | Freq: Once | INTRAMUSCULAR | Status: AC
  Administered 2011-12-02: 2 mg via SUBCUTANEOUS
  Filled 2011-12-02: qty 0.2

## 2011-12-02 NOTE — Progress Notes (Signed)
First unit of FFP given to patient. Pt tolerated well. MD ordered CT of abdomen and CT notified that FFP can not be running while down for CT scan. Will begin second unit of FFP when patient returns from CT. Vital signs stable. Dion Saucier

## 2011-12-02 NOTE — Consult Note (Signed)
Reason for Consult:hemoperitoneum Referring Physician: Maren Reamer, PA  Rachel Shah is an 78 y.o. female.  HPI: 78yo WF referred by Maren Reamer, PA, for evaluation of hemoperitoneum. We were asked to consult because of the CT finding of hemoperitoneum earlier this evening. The patient was admitted to the cardiology service yesterday for failure to thrive. She also was complaining of some abdominal pain. It was initially thought that her failure to thrive was probably cardiac in origin. She was found to have a supratherapeutic INR of greater than 10. She was given vitamin K. She was also ordered for FFP earlier today. So far she has had 1 unit of FFP. Her INR late this afternoon was 2.75. She also had some mild transient hypotension with systolics in the 90s earlier this afternoon. She was given a bolus of fluid. She apparently had been on Coumadin for a recently diagnosed DVT.  The patient has been in the hospital in [place] earlier this month. It appears that she had a CHF exacerbation. She was apparently also found to have a DVT. By report she was sent to a rehabilitation facility; however, she left AMA per the medical record. She has relocated to [facility] to live with her daughter.there is no family at the bedside.  Apparently the patient has had some abdominal pain for the past several days. By report she is also had some nausea and vomiting as well as poor appetite. She complains of pain in her lower abdomen. She states that she has been having bowel movements and is having flatus. She denies being on any antibiotics however she can't really remember what all she was given when she was in the hospital. She states that she had some loose stool a few days ago. She denies any melena or hematochezia. She denies any NSAID use. She denies any recent falls.  Past Medical History  Diagnosis Date  . Atrial flutter   . Supraventricular tachycardia   . Tachy-brady syndrome     post [employer] revealed  pulse generator dual lead pacemaker  . CHF (congestive heart failure)     a. hx of diastolic CHF;  b. recent dx with systolic CHF  . Angina at rest   . Transient ischemic attack 2007    Remote history of  . Fibroids     History of uterine fibroids  . Thrombocytopenia     Mild  . Abnormal thyroid function test     Mildly abnormal thyroid function studies with a TSH of 7.984 (upper limit of normal 4.5), free T4 within normal limits at 1.21 and T3  slightly low at 69.2 (lower limit of normal 80), follow up with primary care  . Hypertension     Past Surgical History  Procedure Date  . Cardiac pacemaker placement     Family History  Problem Relation Age of Onset  . Cancer Sister   . Other      Negative for coronary artery disease    Social History:  reports that she has never smoked. She does not have any smokeless tobacco history on file. She reports that she does not drink alcohol or use illicit drugs.  Allergies:  Allergies  Allergen Reactions  . Plavix (Clopidogrel Bisulfate)     STOMACH UPSTET    Medications: I have reviewed the patient's current medications.  Results for orders placed during the hospital encounter of 12/01/11 (from the past 48 hour(s))  TROPONIN I     Status: Normal   Collection Time  12/01/11  5:28 PM      Component Value Range Comment   Troponin I <0.30  <0.30 ng/mL   PROTIME-INR     Status: Abnormal   Collection Time   12/01/11  5:28 PM      Component Value Range Comment   Prothrombin Time >90.0 (*) 11.6 - 15.2 seconds    INR >10.00 (*) 0.00 - 1.49   APTT     Status: Abnormal   Collection Time   12/01/11  5:28 PM      Component Value Range Comment   aPTT >200 (*) 24 - 37 seconds   CBC WITH DIFFERENTIAL     Status: Abnormal   Collection Time   12/01/11  5:28 PM      Component Value Range Comment   WBC 10.1  4.0 - 10.5 K/uL    RBC 4.27  3.87 - 5.11 MIL/uL    Hemoglobin 13.2  12.0 - 15.0 g/dL    HCT 16.1  09.6 - 04.5 %    MCV 93.7  78.0 -  100.0 fL    MCH 30.9  26.0 - 34.0 pg    MCHC 33.0  30.0 - 36.0 g/dL    RDW 40.9 (*) 81.1 - 15.5 %    Platelets 171  150 - 400 K/uL    Neutrophils Relative 83 (*) 43 - 77 %    Neutro Abs 8.4 (*) 1.7 - 7.7 K/uL    Lymphocytes Relative 7 (*) 12 - 46 %    Lymphs Abs 0.8  0.7 - 4.0 K/uL    Monocytes Relative 8  3 - 12 %    Monocytes Absolute 0.8  0.1 - 1.0 K/uL    Eosinophils Relative 1  0 - 5 %    Eosinophils Absolute 0.1  0.0 - 0.7 K/uL    Basophils Relative 0  0 - 1 %    Basophils Absolute 0.0  0.0 - 0.1 K/uL   TSH     Status: Abnormal   Collection Time   12/01/11  5:28 PM      Component Value Range Comment   TSH 5.413 (*) 0.350 - 4.500 uIU/mL   COMPREHENSIVE METABOLIC PANEL     Status: Abnormal   Collection Time   12/01/11  5:28 PM      Component Value Range Comment   Sodium 135  135 - 145 mEq/L    Potassium 4.0  3.5 - 5.1 mEq/L    Chloride 97  96 - 112 mEq/L    CO2 26  19 - 32 mEq/L    Glucose, Bld 107 (*) 70 - 99 mg/dL    BUN 45 (*) 6 - 23 mg/dL    Creatinine, Ser 9.14 (*) 0.50 - 1.10 mg/dL    Calcium 8.9  8.4 - 78.2 mg/dL    Total Protein 6.6  6.0 - 8.3 g/dL    Albumin 3.3 (*) 3.5 - 5.2 g/dL    AST 25  0 - 37 U/L    ALT 17  0 - 35 U/L    Alkaline Phosphatase 54  39 - 117 U/L    Total Bilirubin 1.6 (*) 0.3 - 1.2 mg/dL    GFR calc non Af Amer 30 (*) >90 mL/min    GFR calc Af Amer 35 (*) >90 mL/min   PRO B NATRIURETIC PEPTIDE     Status: Abnormal   Collection Time   12/01/11  5:28 PM      Component Value Range Comment  Pro B Natriuretic peptide (BNP) 867.7 (*) 0 - 450 pg/mL   DIGOXIN LEVEL     Status: Abnormal   Collection Time   12/01/11  5:28 PM      Component Value Range Comment   Digoxin Level <0.3 (*) 0.8 - 2.0 ng/mL   ABO/RH     Status: Normal   Collection Time   12/01/11  5:29 PM      Component Value Range Comment   ABO/RH(D) A POS     PROTIME-INR     Status: Abnormal   Collection Time   12/01/11  7:08 PM      Component Value Range Comment   Prothrombin  Time >90.0 (*) 11.6 - 15.2 seconds    INR >10.00 (*) 0.00 - 1.49   TROPONIN I     Status: Normal   Collection Time   12/01/11 10:05 PM      Component Value Range Comment   Troponin I <0.30  <0.30 ng/mL   TROPONIN I     Status: Normal   Collection Time   12/02/11  4:05 AM      Component Value Range Comment   Troponin I <0.30  <0.30 ng/mL   BASIC METABOLIC PANEL     Status: Abnormal   Collection Time   12/02/11  4:05 AM      Component Value Range Comment   Sodium 137  135 - 145 mEq/L    Potassium 4.4  3.5 - 5.1 mEq/L    Chloride 99  96 - 112 mEq/L    CO2 28  19 - 32 mEq/L    Glucose, Bld 130 (*) 70 - 99 mg/dL    BUN 45 (*) 6 - 23 mg/dL    Creatinine, Ser 1.61 (*) 0.50 - 1.10 mg/dL    Calcium 9.0  8.4 - 09.6 mg/dL    GFR calc non Af Amer 28 (*) >90 mL/min    GFR calc Af Amer 33 (*) >90 mL/min   PROTIME-INR     Status: Abnormal   Collection Time   12/02/11  4:05 AM      Component Value Range Comment   Prothrombin Time >90.0 (*) 11.6 - 15.2 seconds REPEATED TO VERIFY   INR >10.00 (*) 0.00 - 1.49   TYPE AND SCREEN     Status: Normal   Collection Time   12/02/11  1:22 PM      Component Value Range Comment   ABO/RH(D) A POS      Antibody Screen NEG      Sample Expiration 12/05/2011     PREPARE FRESH FROZEN PLASMA     Status: Normal (Preliminary result)   Collection Time   12/02/11  1:30 PM      Component Value Range Comment   Unit Number [id number]      Blood Component Type THAWED PLASMA      Unit division 00      Status of Unit ISSUED      Transfusion Status OK TO TRANSFUSE      Unit Number [id number]      Blood Component Type THAWED PLASMA      Unit division 00      Status of Unit REL FROM [facility]      Transfusion Status OK TO TRANSFUSE      Unit Number [id number]      Blood Component Type THAWED PLASMA      Unit division 00      Status of Unit ISSUED  Transfusion Status OK TO TRANSFUSE      Unit Number [id number]      Blood Component Type THAWED PLASMA       Unit division 00      Status of Unit ALLOCATED      Transfusion Status OK TO TRANSFUSE     DIGOXIN LEVEL     Status: Abnormal   Collection Time   12/02/11  5:39 PM      Component Value Range Comment   Digoxin Level <0.3 (*) 0.8 - 2.0 ng/mL   PROTIME-INR     Status: Abnormal   Collection Time   12/02/11  6:04 PM      Component Value Range Comment   Prothrombin Time 27.7 (*) 11.6 - 15.2 seconds    INR 2.75 (*) 0.00 - 1.49    RADIOLOGICAL STUDIES: I have personally reviewed the radiological exams myself  X-ray Chest Pa And Lateral  12/01/2011  *RADIOLOGY REPORT*  Clinical Data: 78 year old female with cough and hemoptysis.  CHEST - 2 VIEW  Comparison: 06/21/2010 and earlier.  Findings: Stable left chest cardiac pacemaker.  Lower lung volumes. Crowding of markings at both lung bases, some patchy opacity.  No definite consolidation or effusion.  No pneumothorax or edema. Stable cardiac size and mediastinal contours.  Osteopenia. Multilevel spinal compression deformities, no definite acute osseous abnormality.  IMPRESSION: Low lung volumes with patchy bibasilar opacity which most resembles atelectasis.   Original Report Authenticated By: Erskine Speed, M.D.    Ct Abdomen Pelvis W Contrast  12/02/2011  *RADIOLOGY REPORT*  Clinical Data: Lower abdominal pain since last night, evaluate for GI bleed or hematoma  CT ABDOMEN AND PELVIS WITH CONTRAST  Technique:  Multidetector CT imaging of the abdomen and pelvis was performed following the standard protocol during bolus administration of intravenous contrast.  Contrast: 75mL OMNIPAQUE IOHEXOL 300 MG/ML  SOLN  Comparison: None.  Findings:  There is a moderate amount of high-density fluid compatible with blood within the pelvic cul-de-sac extending along the bilateral pericolic gutters to extend to involve the inferior aspect of the liver and spleen.   There is a very subtle contour abnormality within the peripheral aspect of the spleen (image 16.  No  definitive evidence of contrast extravasation at this location.  There is a short segment loop of small bowel within the midline of the lower pelvis (axial images 56 through 58) which demonstrates wall thickening.  There is minimal similar inflammatory change with an additional short segment of small bowel within the left hemi pelvis (image 56, series 2).  Neither of these loops of bowel result in enteric obstruction.  No pneumoperitoneum, pneumatosis or portal venous gas.  Extensive atherosclerotic calcifications of a normal caliber abdominal aorta.  The proximal aspects of the major branch vessels of the abdominal aorta, including the IMA, appear patent on this non CTA examination.  Normal appearance of the retrocecal appendix.  Normal hepatic contour.  Note is made of an approximately 1.2 cm hypoattenuating (11 HU) cyst within the anterior segment right lobe of the liver (image 17, series 2).  There is an additional approximately 1.5 cm cyst within the posterior segment right lobe of the liver (image 13).  Additional smaller sub centimeter hypoattenuating hepatic lesions are too small to accurately characterize the favored to represent additional hepatic cysts. Normal appearance of the gallbladder.  No definite intra or extrahepatic biliary ductal dilatation.  There is a serpiginous hypoattenuating structure regional to the pancreatic head (coronal images 45 through 48),  measuring approximately 1.1 x 2.2 cm in greatest oblique axial dimension (axial image 26, series 2).  This structure is without associated pancreatic ductal dilatation.  There is age appropriate pancreatic atrophy.  Scattered calcifications within the spleen, sequela of prior granulomas infection.  There is symmetric enhancement and excretion of the bilateral kidneys.  Incidental note is made of a right-sided parapelvic cyst. Additional bilateral hypoattenuating lesions are too small to accurately characterize the favored to represent additional  renal cysts.  No urinary obstruction. No definite renal stones.  No urinary obstruction or perinephric stranding.  Normal appearance of the bilateral adrenal glands.  Limited visualization lower thorax is negative for focal airspace opacity.  Cardiomegaly.  Pacer leads seen within the right atrium and ventricle.  Calcifications within the papillary musculature of the left ventricle.  No pericardial effusion.  Age indeterminate moderate (approximately 50%) compression deformity of the L1 vertebral body.  No retropulsion of the posterior elements.  Moderate to severe multilevel lumbar spine degenerative change, worse at L4 - L5.  Post pinning of the left femoral neck.  IMPRESSION:  1.  Mild to moderate amounts of hemoperitoneum, the etiology of which is indeterminate on this examination.  Differential considerations include possible splenic laceration as there is a subtle contour abnormality within the peripheral aspect of the spleen (however this is without definitive contrast extravasation). Another etiology are two short segment abnormal loops of small bowel within the midline and left lateral aspect of the pelvis. Given the geographic involvement of the loops of small bowel as well as extensive atherosclerotic disease within the abdominal aorta, mesenteric ischemia is a possible differential possibility.  2.  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  This finding is without associated pancreatic ductal dilatation or peripancreatic stranding.  Comparison to prior examinations (if available) is recommended.  If no comparisons exist, further evaluation with non emergent pancreatic protocol MRI may be performed as clinically indicated. 3.  Age indeterminate moderate (approximately 50%) compression deformity of the L1 vertebral body.  Above findings discussed with Mahala Menghini, MD at 1921.   Original Report Authenticated By: Tacey Ruiz, MD     Review of  Systems  Constitutional: Negative for fever and chills.  HENT: Negative for nosebleeds.   Eyes: Positive for blurred vision. Negative for discharge and redness.  Respiratory: Positive for hemoptysis. Negative for sputum production.   Cardiovascular: Negative for chest pain and leg swelling.       See hpi  Gastrointestinal: Positive for abdominal pain and diarrhea. Negative for blood in stool and melena.  Genitourinary: Negative for dysuria and hematuria.  Musculoskeletal: Negative for falls.  Neurological: Positive for weakness. Negative for seizures and loss of consciousness.       Med record reports some recent confusion  Psychiatric/Behavioral: Negative for substance abuse.   Blood pressure 103/65, pulse 73, temperature 98.3 F (36.8 C), temperature source Oral, resp. rate 20, height 5' (1.524 m), weight 108 lb 7.5 oz (49.2 kg), SpO2 98.00%. Physical Exam  Vitals reviewed. Constitutional: She appears well-developed and well-nourished. No distress.  HENT:  Head: Normocephalic and atraumatic.  Right Ear: External ear normal.  Left Ear: External ear normal.  Eyes: Conjunctivae normal are normal. No scleral icterus.  Neck: Normal range of motion. Neck supple. No tracheal deviation present. No thyromegaly present.  Cardiovascular: Normal rate and normal heart sounds.   Respiratory: Effort normal and breath sounds normal. No respiratory distress. She has no wheezes.  GI: Soft.  Bowel sounds are normal. She exhibits no mass. There is no rigidity, no rebound and no guarding.       Some fullness/bloating; c/o TTP in LLQ; but no grimacing on palpation; no RT/guarding/peritonitis  Musculoskeletal: She exhibits no edema and no tenderness.  Neurological: She is alert. GCS eye subscore is 4. GCS verbal subscore is 5. GCS motor subscore is 6.  Skin: Skin is warm and dry. No rash noted. She is not diaphoretic. No erythema. No pallor.  Psychiatric: She has a normal mood and affect. Her behavior is  normal.    Assessment/Plan: Patient Active Problem List  Diagnosis  . HTN (hypertension)  . chest pain  . Tachy-brady syndrome  . Atrial fibrillation  . Nocturnal dyspnea/choking  . Pacemaker-MDT  . DVT (deep venous thrombosis)  . Chronic systolic heart failure  . Failure to thrive  . Abdominal pain  * hemoperitoneum  * supratherapeutic INR  * Acute kidney injury   Currently the patient appears to be stable. Her blood pressure is normal. I believe that hemoperitoneum is due to her supratherapeutic INR. I believe this represents a spontaneous bleed. I am not convinced she has a splenic laceration. There is no sign of active extravasation on her CT scan. My suspicion for the intestine being the source of the hemoperitoneum is also low. There is no wall thickening, pneumatosis, or portal venous gas. There is no free air. I am not sure as to the etiology of her failure to thrive. The hemoperitoneum can certainly irritate her intestines. Currently she does not have a surgical abdomen.  I recommend n.p.o. Except meds for this evening. Her INR needs to be corrected to less than 1.5. I would also check a CBC. She is not had a hemoglobin checked since yesterday evening. I would also check a DIC panel. I would make sure the patient has an active type and cross and adequate peripheral large bore IV access. I would also repeat a CBC in the morning. I would have a low threshold for transferring the patient to step down. Obviously given her age and cardiac history balancing her volume status will be important in order to prevent her from going into volume overload. I will defer to the hospitalist and cardiologist about giving Lasix between colloid infusions.  We will follow along.  Mary Sella. Andrey Campanile, MD, FACS General, Bariatric, & Minimally Invasive Surgery [facility], [place]   [facility] M 12/02/2011, 8:38 PM

## 2011-12-02 NOTE — Progress Notes (Signed)
Echocardiogram 2D Echocardiogram has been performed.  Rachel Shah 12/02/2011, 4:30 PM

## 2011-12-02 NOTE — Progress Notes (Signed)
MD called and put NS bolus on hold to see if FFP resolves the patient's hypotension. Will continue to monitor. Dion Saucier

## 2011-12-02 NOTE — Progress Notes (Signed)
Patient: Rachel Shah Date of Encounter: 12/02/2011, 7:58 AM Admit date: 12/01/2011     Subjective  Ms. Battaglia reports lower abdominal pain through the night. She denies CP, SOB or palpitations. Per admission H&P, she has had several recent hospitalizations, intermittent confusion, anorexia, nausea, vomiting and diarrhea.    Objective  Physical Exam: Vitals: BP 117/63  Pulse 77  Temp 97.6 F (36.4 C) (Axillary)  Resp 20  Ht 5' (1.524 m)  Wt 108 lb 7.5 oz (49.2 kg)  BMI 21.18 kg/m2  SpO2 100% General: Thin, elderly 78 year old female in no acute distress. Neck: Supple. JVD not elevated. Lungs: Clear bilaterally to auscultation without wheezes, rales, or rhonchi. Breathing is unlabored. Heart: RRR S1 S2 without murmurs, rubs, or gallops.  Abdomen: Soft, non-distended. Extremities: No clubbing or cyanosis. No edema.  Distal pedal pulses are 2+ and equal bilaterally. Neuro: Alert. Moves all extremities spontaneously. No focal deficits.  Intake/Output: No intake or output data in the 24 hours ending 12/02/11 0758  Inpatient Medications:  . atorvastatin  10 mg Oral QPC supper  . digoxin  0.0625 mg Oral Daily  . furosemide  40 mg Oral Daily  . lisinopril  5 mg Oral Daily  . metoprolol  50 mg Oral BID  . [COMPLETED] phytonadione  5 mg Oral Once  . potassium chloride  20 mEq Oral Daily  . sodium chloride  3 mL Intravenous Q12H  . spironolactone  25 mg Oral Daily  . [DISCONTINUED] warfarin  7.5 mg Oral q1800  . [DISCONTINUED] Warfarin - Physician Dosing Inpatient   Does not apply q1800   Labs:  [facility] 12/02/11 0405 12/01/11 1728  NA 137 135  K 4.4 4.0  CL 99 97  CO2 28 26  GLUCOSE 130* 107*  BUN 45* 45*  CREATININE 1.46* 1.40*  CALCIUM 9.0 8.9  MG -- --  PHOS -- --    Basename 12/01/11 1728  AST 25  ALT 17  ALKPHOS 54  BILITOT 1.6*  PROT 6.6  ALBUMIN 3.3*    Basename 12/01/11 1728  WBC 10.1  NEUTROABS 8.4*  HGB 13.2  HCT 40.0  MCV 93.7  PLT 171     Basename 12/02/11 0405 12/01/11 2205 12/01/11 1728  CKTOTAL -- -- --  CKMB -- -- --  TROPONINI <0.30 <0.30 <0.30    Basename 12/01/11 1728  TSH 5.413*  T4TOTAL --  T3FREE --  THYROIDAB --    INR >10 ProBNP 867.7 Digoxin level <0.3   Radiology/Studies: X-ray Chest Pa And Lateral  12/01/2011  *RADIOLOGY REPORT*  Clinical Data: 78 year old female with cough and hemoptysis.  CHEST - 2 VIEW  Comparison: 06/21/2010 and earlier.  Findings: Stable left chest cardiac pacemaker.  Lower lung volumes. Crowding of markings at both lung bases, some patchy opacity.  No definite consolidation or effusion.  No pneumothorax or edema. Stable cardiac size and mediastinal contours.  Osteopenia. Multilevel spinal compression deformities, no definite acute osseous abnormality.  IMPRESSION: Low lung volumes with patchy bibasilar opacity which most resembles atelectasis.   Original Report Authenticated By: Erskine Speed, M.D.     Echocardiogram: pending Telemetry: normal sinus rhythm; few PVCs; brief, nonsustained atrial tachycardia x1 this AM, asymptomatic (<10 seconds)    Assessment and Plan  1. Failure to thrive - will ask hospitalist service for input/recommendations 2. Chronic systolic HF - she does not appear volume overloaded at this time; echo pending 3. Atrial fibrillation - would stop digoxin (although level ok); she  is high risk for medication toxicity given her advanced age, intermittent confusion and underlying renal dysfunction 5. Supratherapeutic INR - was given vitamin K yesterday; D/C warfarin and follow INR 6. Abdominal pain - ? etiology 7. Renal insufficiency  Dr. Johney Frame to see and make further recommendations. Signed, EDMISTEN, BROOKE PA-C  I have seen, examined the patient, and reviewed the above assessment and plan.  Changes to above are made where necessary.  Pt is elderly and frail.  She presents with supratherapeutic INR, SOB, and abdominal pain.  I agree that evaluation by  the hospitalists would be beneficial.  Would keep Is and Os about even and follow.  Will ask PT to see.  Her prognosis is guarded at this time.  Co Sign: Hillis Range, MD 12/02/2011 9:24 AM

## 2011-12-02 NOTE — Progress Notes (Addendum)
Shift event:  78 yo WF presented with abd pain and found to have an INR over 10 on admission. Tonight, NP was called by attending at shift change to f/up CT abd/pelvis results. Radiologist called NP to give results, but attending was available to receive results. Attending asked this NP to call surgery 2/2 hemoperitoneum on CT. This NP called Dr. Andrey Campanile, CCCS, who agreed to see the pt. See Dr. Tawana Scale consult note, but basically, he thinks the bleed is spontaneous and does not feel the pt is a surgical candidate at this time. Will aggressively treat anticoagulopathy to get INR less than 1.5 as recommended by surgery. Additional labs ordered at this time. At present, RN just hung the 2nd unit of FFP. Pt to get total of 3 tonight. Also, NP ordered 2mg  Vit K SQ now. INR, CBC in am. Stat labs are presently on hold because of the FFP hanging. Will check labs as soon as possible after FFP in.  Hgb was normal 12/01/11.  S: Pt says she feels better than she did yesterday. She says her abd is a "little sore" but no real pain. No n/v. She is taking some fluids today.  O: VS reviewed. Chart reviewed. Labs reviewed. Rachel Shah is a very pleasant, 78 yo WF who appears younger than her stated age. She is lying in bed, smiling. Abd with BS + x 4 quads. Abd is soft, ND. With palpation, she says her abd is sore all over, but especially in the LLQ. However, she has no rebound or guarding on exam.  A/P:  1. Hemoperitoneum-we are agressively correcting her coagulopathy. She will have a total of 3 U FFP tonight along with the 2mg  Vit K.  INR to be rechecked in early am, along with fibrinogen, and CBC. Surgery has consulted and do not advise surgery at present. Pt has had some hypotension and had a bolus today, but she conts to be non symptomatic from BPs in the 90s to low 100s. Consideration to transfer pt to step down was given, but at present, she is very stable and I think it is prudent to leave her on the nsg unit. However,  should her BP fall or any other events occur, will not hesitate to send her to step down. Cardio saw her today and stated she didn't appear fluid overloaded, so for now, will cont to watch her and not give any Lasix with the FFP.  Maren Reamer, NP [facility]

## 2011-12-02 NOTE — Consult Note (Signed)
[facility] Medical Consultation  Kazia Grisanti ZOX:[id number] DOB: 02/13/1910 DOA: 12/01/2011 PCP: Eustaquio Boyden, MD   Requesting physician: Dr. Johney Frame  Date of consultation: 12/02/11 Reason for consultation: Abdominal pain/elevated INR above 10  Impression/Recommendations  Active Problems:  Tachy-brady syndrome  Atrial fibrillation  Nocturnal dyspnea/choking  Pacemaker-MDT  DVT (deep venous thrombosis)  Chronic systolic heart failure  Failure to thrive  Abdominal pain  1. Abdominal pain in the setting of supratherapeutic INR-DDX = likely diverticulosis vs Sol Passer can be a risk for gerd] but with + LLQ pain and INr above 10 must rule out 10 hemorrhage or hematoma. Stat CT scan of the abdomen pelvis ordered.  2. Tachy-brady syndrome c Pacemaker-Per cardiology.  At this stage with supra-therapeutic INR and confusion, no role for coumadin.  Would potentially consider ASA 325.  Would hold digoxin until seen in am by Cardiology-Get Dig level in am. 3. Hypotension-potentially multifactorial-on multiple anti-htn meds [aldactone 25/lisinopril 5/lasix 40 bid-which were discontinued except for Metoprolol 50-->tapered to 12.5 for rate control.  Bolus 500 cc IVF given, rate of 75 cc/hour ordered.  Will reassess this pm--> if becomes unstable, would tx to SDU.  Orthostatics q shift 4. Supratherapeutic INR-we will discontinue her Coumadin completely.  I have reversed her INR given differential diagnosis being hematoma/hemorrhage and we will check an INR this evening. 5. Acute kidney Injury-up from baseline.  Likely pre-renal given above history.  IVF 75 cc/hr.  Daily renal panel.   6. N/v/d-was allegedly given imodium when she called cardiology for app 11/3-low threshold to get Clostridium diff/stool cultures 7. Acute metabolic encephalopathy-likely multifactorial, Ddx= renal insufficiency >infectious + component mild dementia. 8. H/o TIA 2007-Consider ASa 325 from 11.6.13 after CT  scan abdomen pelvis-Pt/Ot consult for mobility recommendations 9. H/o osteoporosis-hold actonel  Thank you for this consultation.  [facility] will assume Attending role today 12/02/11.  Discussed case, POC with Dr. Johney Frame, Cardiology  Chief Complaint: Abdominal pain with supra-therapeutic INR  HPI:   78 yr old pleasant CF presneted to her relatives Cardiology office yesterday with c/o malaise and abdominal pain.  Given her concerning exam yesterday, in terms of bloating and pain, and anorexia was admitted by [facility] yesterday.  She states that the abdominal pain was releived by medications that she received while she was here.    She actually goes on to say that she had ice cream last night in addition to a fruit plate. She was not able to eat a sandwich however last night She states that the abd pain started maybe about 2 days ago [~maybe]-she statres she has diarrhoea and perhaps vomiting, potentitally the day before she came into the Hospital.  She states that she had this 2-3 days antecedent to coming to the Hospital.  She has not per her recollection been on any antibiotics.  Deneis any specific preceeding fever-on questioning she states that she's not had any sputum or cough but has had a runny nose. It is unclear whether she had a flu shot. She denies categorically any burning in the urine or any other issue She describes the abd pain as bloating and "gas"-she doesn't recall if she was given antibiotics in the antecedent 1-2 weeks however thinks that she probably was not feeling well 1-2 weeks ago as well.  She seems to have moderate recollection of recent events and can tell me the last time she was well. I suspect she is a little confused but is clear enough to give some indication as to her  current health state  Daughter reports that the patient started on 9.27.13  She started to have abdominal pain-noted Afib c Htn urgency and was placed in rehab-she left the rehab AMA-she was  brought here by her daughter and has a good week and was bed-bound for maybe a week, drinking very little fluids.  Went through a period of diarrhea and throwing up-she would only take Ensure-She also allegedly felt SOB and went with her Daughter to the hospital. Daughter reports no blood but did not specifically look at it.  She need some coughing which had a little blood.  She has not noted any acute fevers.  She has had good questions usually, but has been confused more over the last week per daughter  Review of Systems:  See above  Past Medical History  Diagnosis Date  . Atrial flutter   . Supraventricular tachycardia   . Tachy-brady syndrome     post [employer] revealed pulse generator dual lead pacemaker  . CHF (congestive heart failure)     a. hx of diastolic CHF;  b. recent dx with systolic CHF  . Angina at rest   . Transient ischemic attack 2007    Remote history of  . Fibroids     History of uterine fibroids  . Thrombocytopenia     Mild  . Abnormal thyroid function test     Mildly abnormal thyroid function studies with a TSH of 7.984 (upper limit of normal 4.5), free T4 within normal limits at 1.21 and T3  slightly low at 69.2 (lower limit of normal 80), follow up with primary care  . Hypertension    Chart review  Admit 06/18/10 for Afib c RVR, known Pacer interrogated/pacer placed--A/Chronic diastolic dysfucntion, h/o angina, h/o tia 2007  Seen by Dr. Johney Frame in the office 11/10/11  Echo 5/12=Ef55-60%, HF defined as class 3  Past Surgical History  Procedure Date  . Cardiac pacemaker placement    Social History:  reports that she has never smoked. She does not have any smokeless tobacco history on file. She reports that she does not drink alcohol or use illicit drugs.  Allergies  Allergen Reactions  . Plavix (Clopidogrel Bisulfate)     STOMACH UPSTET   Family History  Problem Relation Age of Onset  . Cancer Sister   . Other      Negative for coronary artery  disease    Prior to Admission medications   Medication Sig Start Date End Date Taking? Authorizing Provider  ACTONEL 150 MG tablet Take 1 tablet by mouth Daily. 12/02/10  Yes Historical Provider, MD  atorvastatin (LIPITOR) 10 MG tablet Take 1 tablet (10 mg total) by mouth daily. 11/06/11  Yes Duke Salvia, MD  digoxin (LANOXIN) 0.125 MG tablet Take 0.0625 mg by mouth daily.   Yes Historical Provider, MD  furosemide (LASIX) 40 MG tablet Take 1 tablet (40 mg total) by mouth daily. 11/06/11  Yes Duke Salvia, MD  lisinopril (ZESTRIL) 5 MG tablet Take 1 tablet (5 mg total) by mouth daily. 11/06/11  Yes Duke Salvia, MD  metoprolol (LOPRESSOR) 50 MG tablet Take 1 tablet (50 mg total) by mouth 2 (two) times daily. 06/27/11  Yes Duke Salvia, MD  spironolactone (ALDACTONE) 25 MG tablet Take 1 tablet (25 mg total) by mouth daily. 11/06/11  Yes Duke Salvia, MD  VITAMIN E PO Take 1 tablet by mouth daily. ( OTC )    Yes Historical Provider, MD  warfarin (COUMADIN) 7.5  MG tablet Take 7.5 mg by mouth daily.   Yes Historical Provider, MD   Physical Exam: Blood pressure 104/68, pulse 77, temperature 98.9 F (37.2 C), temperature source Axillary, resp. rate 20, height 5' (1.524 m), weight 49.2 kg (108 lb 7.5 oz), SpO2 100.00%. Filed Vitals:   12/01/11 1630 12/01/11 2007 12/02/11 0643 12/02/11 1014  BP: 114/71 128/77 117/63 104/68  Pulse: 67 98 77 77  Temp: 97.6 F (36.4 C) 97.8 F (36.6 C) 97.6 F (36.4 C) 98.9 F (37.2 C)  TempSrc: Axillary Axillary Axillary   Resp: 20 21 20    Height: 5' (1.524 m)     Weight: 48.5 kg (106 lb 14.8 oz)  49.2 kg (108 lb 7.5 oz)   SpO2: 97% 100% 100%      General:  Very hard of hearing pleasant Caucasian female looking younger than stated age  Eyes: EOMI, no pallor or icterus  ENT: Throat clear  Neck: Soft supple nontender nondistended  Cardiovascular: S1-S2 no murmur rub or gallop-sounds regular  Respiratory: Clinically clear no added sound no  tactile vocal resonance or fremitus  Abdomen: Slightly tender in left lower quadrant and right upper quadrant. Bowel sounds are heard. No CVA tenderness  Skin: No lower extremity deep  Musculoskeletal: Good range of motion  Psychiatric: Euthymic  Neurologic: Grossly intact, memory is intact.  Labs on Admission:  Basic Metabolic Panel:  Lab 12/02/11 1610 12/01/11 1728  NA 137 135  K 4.4 4.0  CL 99 97  CO2 28 26  GLUCOSE 130* 107*  BUN 45* 45*  CREATININE 1.46* 1.40*  CALCIUM 9.0 8.9  MG -- --  PHOS -- --   Liver Function Tests:  Lab 12/01/11 1728  AST 25  ALT 17  ALKPHOS 54  BILITOT 1.6*  PROT 6.6  ALBUMIN 3.3*   No results found for this basename: LIPASE:5,AMYLASE:5 in the last 168 hours No results found for this basename: AMMONIA:5 in the last 168 hours CBC:  Lab 12/01/11 1728  WBC 10.1  NEUTROABS 8.4*  HGB 13.2  HCT 40.0  MCV 93.7  PLT 171   Cardiac Enzymes:  Lab 12/02/11 0405 12/01/11 2205 12/01/11 1728  CKTOTAL -- -- --  CKMB -- -- --  CKMBINDEX -- -- --  TROPONINI <0.30 <0.30 <0.30   BNP: No components found with this basename: POCBNP:5 CBG: No results found for this basename: GLUCAP:5 in the last 168 hours  Radiological Exams on Admission: X-ray Chest Pa And Lateral  12/01/2011  *RADIOLOGY REPORT*  Clinical Data: 78 year old female with cough and hemoptysis.  CHEST - 2 VIEW  Comparison: 06/21/2010 and earlier.  Findings: Stable left chest cardiac pacemaker.  Lower lung volumes. Crowding of markings at both lung bases, some patchy opacity.  No definite consolidation or effusion.  No pneumothorax or edema. Stable cardiac size and mediastinal contours.  Osteopenia. Multilevel spinal compression deformities, no definite acute osseous abnormality.  IMPRESSION: Low lung volumes with patchy bibasilar opacity which most resembles atelectasis.   Original Report Authenticated By: Erskine Speed, M.D.     EKG: Independently reviewed. See note from  11/4  Time spent: 70 minutes  Mahala Menghini [facility] Pager [phone]  If 7PM-7AM, please contact night-coverage [email or web address] Password [email or web address] 12/02/2011, 11:37 AM

## 2011-12-02 NOTE — Progress Notes (Signed)
75 cc NS running for continuous fluids per MD order. Dion Saucier

## 2011-12-03 DIAGNOSIS — T794XXA Traumatic shock, initial encounter: Secondary | ICD-10-CM

## 2011-12-03 DIAGNOSIS — I471 Supraventricular tachycardia, unspecified: Secondary | ICD-10-CM | POA: Diagnosis not present

## 2011-12-03 DIAGNOSIS — D62 Acute posthemorrhagic anemia: Secondary | ICD-10-CM

## 2011-12-03 DIAGNOSIS — I495 Sick sinus syndrome: Secondary | ICD-10-CM

## 2011-12-03 DIAGNOSIS — R578 Other shock: Secondary | ICD-10-CM

## 2011-12-03 DIAGNOSIS — T45515A Adverse effect of anticoagulants, initial encounter: Secondary | ICD-10-CM | POA: Diagnosis present

## 2011-12-03 DIAGNOSIS — I498 Other specified cardiac arrhythmias: Secondary | ICD-10-CM

## 2011-12-03 LAB — CBC
MCH: 30.7 pg (ref 26.0–34.0)
MCH: 31.2 pg (ref 26.0–34.0)
MCHC: 33.2 g/dL (ref 30.0–36.0)
MCV: 91.6 fL (ref 78.0–100.0)
Platelets: 139 10*3/uL — ABNORMAL LOW (ref 150–400)
Platelets: 140 10*3/uL — ABNORMAL LOW (ref 150–400)
RBC: 3.13 MIL/uL — ABNORMAL LOW (ref 3.87–5.11)
RDW: 15.8 % — ABNORMAL HIGH (ref 11.5–15.5)
RDW: 15.8 % — ABNORMAL HIGH (ref 11.5–15.5)
WBC: 6.7 10*3/uL (ref 4.0–10.5)

## 2011-12-03 LAB — BASIC METABOLIC PANEL
CO2: 28 mEq/L (ref 19–32)
Calcium: 8.7 mg/dL (ref 8.4–10.5)
GFR calc Af Amer: 34 mL/min — ABNORMAL LOW (ref >90)
GFR calc non Af Amer: 29 mL/min — ABNORMAL LOW (ref >90)
Sodium: 134 mEq/L — ABNORMAL LOW (ref 135–145)

## 2011-12-03 LAB — PREPARE FRESH FROZEN PLASMA
Unit division: 0
Unit division: 0
Unit division: 0

## 2011-12-03 LAB — PROTIME-INR
INR: 1.71 — ABNORMAL HIGH (ref 0.00–1.49)
Prothrombin Time: 19.5 seconds — ABNORMAL HIGH (ref 11.6–15.2)

## 2011-12-03 LAB — LACTIC ACID, PLASMA: Lactic Acid, Venous: 1.3 mmol/L (ref 0.5–2.2)

## 2011-12-03 MED ORDER — WHITE PETROLATUM GEL
Status: AC
  Administered 2011-12-03: 05:00:00
  Filled 2011-12-03: qty 5

## 2011-12-03 MED ORDER — ADENOSINE 6 MG/2ML IV SOLN: 6.0000 mg | Freq: Once | INTRAVENOUS | Status: AC

## 2011-12-03 MED ORDER — SODIUM CHLORIDE 0.9 % IV SOLN: 250.0000 mL | INTRAVENOUS | Status: DC | PRN

## 2011-12-03 MED ORDER — SODIUM CHLORIDE 0.9 % IV BOLUS (SEPSIS)
500.0000 mL | Freq: Once | INTRAVENOUS | Status: AC
  Administered 2011-12-03: 500 mL via INTRAVENOUS

## 2011-12-03 MED ORDER — ADENOSINE 6 MG/2ML IV SOLN
6.0000 mg | Freq: Once | INTRAVENOUS | Status: AC
  Administered 2011-12-03: 6 mg via INTRAVENOUS

## 2011-12-03 MED ORDER — DIGOXIN 0.25 MG/ML IJ SOLN
0.1250 mg | Freq: Once | INTRAMUSCULAR | Status: AC
  Administered 2011-12-03: 0.125 mg via INTRAVENOUS
  Filled 2011-12-03: qty 0.5

## 2011-12-03 MED ORDER — ENSURE COMPLETE PO LIQD
237.0000 mL | Freq: Two times a day (BID) | ORAL | Status: DC
  Administered 2011-12-04 – 2011-12-08 (×6): 237 mL via ORAL

## 2011-12-03 MED ORDER — PANTOPRAZOLE SODIUM 40 MG IV SOLR
40.0000 mg | Freq: Two times a day (BID) | INTRAVENOUS | Status: DC
  Administered 2011-12-03 – 2011-12-04 (×3): 40 mg via INTRAVENOUS
  Filled 2011-12-03 (×5): qty 40

## 2011-12-03 MED ORDER — DILTIAZEM HCL 30 MG PO TABS
30.0000 mg | ORAL_TABLET | Freq: Four times a day (QID) | ORAL | Status: DC
  Administered 2011-12-03 – 2011-12-07 (×17): 30 mg via ORAL
  Filled 2011-12-03 (×20): qty 1

## 2011-12-03 MED ORDER — ADENOSINE 6 MG/2ML IV SOLN
INTRAVENOUS | Status: AC
  Administered 2011-12-03: 6 mg via INTRAVENOUS
  Filled 2011-12-03: qty 2

## 2011-12-03 MED ORDER — FUROSEMIDE 10 MG/ML IJ SOLN: 20.0000 mg | Freq: Once | INTRAMUSCULAR | Status: DC

## 2011-12-03 MED ORDER — VANCOMYCIN HCL 1000 MG IV SOLR: 2000.0000 mg | INTRAVENOUS | Status: DC

## 2011-12-03 MED ORDER — ADENOSINE 6 MG/2ML IV SOLN
INTRAVENOUS | Status: AC
  Administered 2011-12-03: 6 mg
  Filled 2011-12-03: qty 2

## 2011-12-03 NOTE — Progress Notes (Signed)
Update: Spoke to [place], [place] with cardio and updated her on shift events. Cardio will see pt this a.m.   Maren Reamer, NP

## 2011-12-03 NOTE — Progress Notes (Signed)
Report for transfer to [id number] was given to Deanna RN,Pt is alert and oriented,B/P is 96/70mmHg.heart rate at 150s to 160s,O2 saturation at 99% at 2LPM/[place]. Lawyer Washabaugh RN

## 2011-12-03 NOTE — Progress Notes (Signed)
Further events: RN paged NP 2/2 pt sustaining a HR in the 140-150 range. Pt was sleeping and has no complaints. 12 lead obtained and NP to bedside. S: denies CP or SOB. Was sleeping well. O: VS reviewed. Still hypotensive but stable. 12 lead reviewed-sinus tach. RRR, S1S2 heard. Remains alert, oriented and pleasant.  A/P: 1. Sinus tach in the 150s. Her BP can not handle more BB. Dig was d/c'd today 2/2 risks of future toxicity, but at this time, it is appropriate choice to lower her HR. Given these new issues, will TF pt to step down now. 2. Hemoperitoneum-labs ordered earlier have been drawn and will follow these up shortly. Transfuse as needed and r/p Vit D and/or FFP if INR still elevated.  3. Hx tachy/brady syndrome-I reviewed some cardio notes and it seems she was seen by cardio in office in May of this year and her pacer was interrogated with HR in the 140-150s at that time also. She was restarted on her BB at that time.  4. CHF-still doesn't appear to be in volume overload. Will monitor. She is NPO now.  Maren Reamer, NP [facility]

## 2011-12-03 NOTE — Consult Note (Signed)
PULMONARY  / CRITICAL CARE MEDICINE  Name: Rachel Shah MRN: [id number] DOB: 09/11/1910    LOS: 2  REFERRING PRIVIDER:  [facility]  CHIEF COMPLAINT:  Dyspnea, blood loss anemia, tachy-brady syndrome, elevated INR in setting of coumadin use and RP bleed by CT scan. Hypotension.  BRIEF PATIENT DESCRIPTION: 78 yo WF , awake and interactive.  LINES / TUBES: none  CULTURES: none  ANTIBIOTICS: none  SIGNIFICANT EVENTS:  11-4 INR>10 tx with Vit k ,ffp 11-16 HR 160 SVT tx with adenosine per cards.  LEVEL OF CARE: SDU PRIMARY SERVICE: [facility] CONSULTANTS:  Cards CODE STATUS: lcb, no cpr , no intubation DIET:  Npo DVT Px:  PAS GI Px:  PPI bid  HISTORY OF PRESENT ILLNESS:   78 yo Wm with hx Afib, Tachy-Brady syndrome and on coumadin. She presented to [facility] 11/4 with CC: dyspnea. Found to have INR>10 and CT abd was + for RP bleed. Hgb dropped from 13.3 on 11/4 to 9.2 on 11/6. She further developed SVT with HR >160(tx with adenosine) and hypotension with systolic 70's(tx with IVF). PCCM asked to evaluate 11-6 0650. Inr 10->1.42 on 11-6 after FFP and Vit K. She is a full code.  PAST MEDICAL HISTORY :  Past Medical History  Diagnosis Date  . Atrial flutter   . Supraventricular tachycardia   . Tachy-brady syndrome     post [employer] revealed pulse generator dual lead pacemaker  . CHF (congestive heart failure)     a. hx of diastolic CHF;  b. recent dx with systolic CHF  . Angina at rest   . Transient ischemic attack 2007    Remote history of  . Fibroids     History of uterine fibroids  . Thrombocytopenia     Mild  . Abnormal thyroid function test     Mildly abnormal thyroid function studies with a TSH of 7.984 (upper limit of normal 4.5), free T4 within normal limits at 1.21 and T3  slightly low at 69.2 (lower limit of normal 80), follow up with primary care  . Hypertension   . High cholesterol   . Pacemaker   . Shortness of breath   . History of blood transfusion  12/02/2011  . Lower GI bleeding     "dr told me last year that I might ought to have OR but not at my age" (12/02/2011)  . Hard of hearing     bilaterally "(12/02/2011)   Past Surgical History  Procedure Date  . Cataract extraction w/ intraocular lens implant 2012    left  . Dilation and curettage of uterus   . Cardiac pacemaker placement ?02/2010   Prior to Admission medications   Medication Sig Start Date End Date Taking? Authorizing Provider  ACTONEL 150 MG tablet Take 1 tablet by mouth Daily. 12/02/10  Yes Historical Provider, MD  atorvastatin (LIPITOR) 10 MG tablet Take 1 tablet (10 mg total) by mouth daily. 11/06/11  Yes Duke Salvia, MD  digoxin (LANOXIN) 0.125 MG tablet Take 0.0625 mg by mouth daily.   Yes Historical Provider, MD  furosemide (LASIX) 40 MG tablet Take 1 tablet (40 mg total) by mouth daily. 11/06/11  Yes Duke Salvia, MD  lisinopril (ZESTRIL) 5 MG tablet Take 1 tablet (5 mg total) by mouth daily. 11/06/11  Yes Duke Salvia, MD  metoprolol (LOPRESSOR) 50 MG tablet Take 1 tablet (50 mg total) by mouth 2 (two) times daily. 06/27/11  Yes Duke Salvia, MD  spironolactone (ALDACTONE) 25 MG  tablet Take 1 tablet (25 mg total) by mouth daily. 11/06/11  Yes Duke Salvia, MD  VITAMIN E PO Take 1 tablet by mouth daily. ( OTC )    Yes Historical Provider, MD  warfarin (COUMADIN) 7.5 MG tablet Take 7.5 mg by mouth daily.   Yes Historical Provider, MD   Allergies  Allergen Reactions  . Plavix (Clopidogrel Bisulfate)     STOMACH UPSTET    FAMILY HISTORY:  Family History  Problem Relation Age of Onset  . Cancer Sister   . Other      Negative for coronary artery disease   SOCIAL HISTORY:  reports that she has never smoked. She has never used smokeless tobacco. She reports that she does not drink alcohol or use illicit drugs.  REVIEW OF SYSTEMS:  + for recent abd pain. Denies bloody stools or hematuria. Co sob. No FCS.    INTERVAL HISTORY:   VITAL SIGNS: Temp:   [97.5 F (36.4 C)-98.9 F (37.2 C)] 97.6 F (36.4 C) (11/06 0615) Pulse Rate:  [59-162] 157  (11/06 0645) Resp:  [11-26] 11  (11/06 0645) BP: (72-104)/(38-68) 72/38 mmHg (11/06 0645) SpO2:  [98 %-100 %] 100 % (11/06 0645) Weight:  [51 kg (112 lb 7 oz)] 51 kg (112 lb 7 oz) (11/06 0350)  PHYSICAL EXAMINATION: General:  Awake and alert. NAD Neuro:  Intact HEENT:  +jvd Cardiovascular:  hsir ir svt 160 Lungs:  Decreased in bases Abdomen:  Non distended but tender Musculoskeletal:  intact Skin:  Warm , no edema  DIAGNOSES: Active Problems:  Tachy-brady syndrome  Atrial fibrillation  Nocturnal dyspnea/choking  Pacemaker-MDT  DVT (deep venous thrombosis)  Chronic systolic heart failure  Failure to thrive  Abdominal pain X-ray Chest Pa And Lateral  12/01/2011  *RADIOLOGY REPORT*  Clinical Data: 78 year old female with cough and hemoptysis.  CHEST - 2 VIEW  Comparison: 06/21/2010 and earlier.  Findings: Stable left chest cardiac pacemaker.  Lower lung volumes. Crowding of markings at both lung bases, some patchy opacity.  No definite consolidation or effusion.  No pneumothorax or edema. Stable cardiac size and mediastinal contours.  Osteopenia. Multilevel spinal compression deformities, no definite acute osseous abnormality.  IMPRESSION: Low lung volumes with patchy bibasilar opacity which most resembles atelectasis.   Original Report Authenticated By: Erskine Speed, M.D.    Ct Abdomen Pelvis W Contrast  12/02/2011  *RADIOLOGY REPORT*  Clinical Data: Lower abdominal pain since last night, evaluate for GI bleed or hematoma  CT ABDOMEN AND PELVIS WITH CONTRAST  Technique:  Multidetector CT imaging of the abdomen and pelvis was performed following the standard protocol during bolus administration of intravenous contrast.  Contrast: 75mL OMNIPAQUE IOHEXOL 300 MG/ML  SOLN  Comparison: None.  Findings:  There is a moderate amount of high-density fluid compatible with blood within the pelvic  cul-de-sac extending along the bilateral pericolic gutters to extend to involve the inferior aspect of the liver and spleen.   There is a very subtle contour abnormality within the peripheral aspect of the spleen (image 16.  No definitive evidence of contrast extravasation at this location.  There is a short segment loop of small bowel within the midline of the lower pelvis (axial images 56 through 58) which demonstrates wall thickening.  There is minimal similar inflammatory change with an additional short segment of small bowel within the left hemi pelvis (image 56, series 2).  Neither of these loops of bowel result in enteric obstruction.  No pneumoperitoneum, pneumatosis or portal venous  gas.  Extensive atherosclerotic calcifications of a normal caliber abdominal aorta.  The proximal aspects of the major branch vessels of the abdominal aorta, including the IMA, appear patent on this non CTA examination.  Normal appearance of the retrocecal appendix.  Normal hepatic contour.  Note is made of an approximately 1.2 cm hypoattenuating (11 HU) cyst within the anterior segment right lobe of the liver (image 17, series 2).  There is an additional approximately 1.5 cm cyst within the posterior segment right lobe of the liver (image 13).  Additional smaller sub centimeter hypoattenuating hepatic lesions are too small to accurately characterize the favored to represent additional hepatic cysts. Normal appearance of the gallbladder.  No definite intra or extrahepatic biliary ductal dilatation.  There is a serpiginous hypoattenuating structure regional to the pancreatic head (coronal images 45 through 48), measuring approximately 1.1 x 2.2 cm in greatest oblique axial dimension (axial image 26, series 2).  This structure is without associated pancreatic ductal dilatation.  There is age appropriate pancreatic atrophy.  Scattered calcifications within the spleen, sequela of prior granulomas infection.  There is symmetric  enhancement and excretion of the bilateral kidneys.  Incidental note is made of a right-sided parapelvic cyst. Additional bilateral hypoattenuating lesions are too small to accurately characterize the favored to represent additional renal cysts.  No urinary obstruction. No definite renal stones.  No urinary obstruction or perinephric stranding.  Normal appearance of the bilateral adrenal glands.  Limited visualization lower thorax is negative for focal airspace opacity.  Cardiomegaly.  Pacer leads seen within the right atrium and ventricle.  Calcifications within the papillary musculature of the left ventricle.  No pericardial effusion.  Age indeterminate moderate (approximately 50%) compression deformity of the L1 vertebral body.  No retropulsion of the posterior elements.  Moderate to severe multilevel lumbar spine degenerative change, worse at L4 - L5.  Post pinning of the left femoral neck.  IMPRESSION:  1.  Mild to moderate amounts of hemoperitoneum, the etiology of which is indeterminate on this examination.  Differential considerations include possible splenic laceration as there is a subtle contour abnormality within the peripheral aspect of the spleen (however this is without definitive contrast extravasation). Another etiology are two short segment abnormal loops of small bowel within the midline and left lateral aspect of the pelvis. Given the geographic involvement of the loops of small bowel as well as extensive atherosclerotic disease within the abdominal aorta, mesenteric ischemia is a possible differential possibility.  2.  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  This finding is without associated pancreatic ductal dilatation or peripancreatic stranding.  Comparison to prior examinations (if available) is recommended.  If no comparisons exist, further evaluation with non emergent pancreatic protocol MRI may be performed as clinically  indicated. 3.  Age indeterminate moderate (approximately 50%) compression deformity of the L1 vertebral body.  Above findings discussed with Mahala Menghini, MD at 1921.   Original Report Authenticated By: Tacey Ruiz, MD      ASSESSMENT / PLAN: PULMONARY No results found for this basename: PHART:3,PCO2ART:3,PO2ART:3,HCO3:3,O2SAT:3 in the last 168 hours        A:  Dyspnea most likely cardiac in orgine P:   See cards  CARDIOVASCULAR  Lab 12/03/11 0649 12/03/11 0300 12/02/11 1952 12/02/11 0405 12/01/11 2205 12/01/11 1728  TROPONINI -- <0.30 -- <0.30 <0.30 --  LATICACIDVEN 1.3 -- 1.3 -- -- --  O2SATVEN -- -- -- -- -- --  PROBNP -- -- -- -- --  867.7*   ECG:  SVT  A: SVT with underlying a flutter and CHF. Hypotension multifactorial with blood loss anemia, over diuresis, beta blockade. P:  -Adenosine per cards -stop beta blocker -transfuse -stop diuresis  RENAL  Lab 12/03/11 0250 12/02/11 0405 12/01/11 1728  NA 134* 137 135  K 4.0 4.4 4.0  CL 96 99 97  CO2 28 28 26   BUN 47* 45* 45*  CREATININE 1.43* 1.46* 1.40*  CALCIUM 8.7 9.0 8.9  MG -- -- --  PHOS -- -- --   Intake/Output      11/05 0701 - 11/06 0700 11/06 0701 - 11/07 0700   P.O. 720    Blood 946.5    Total Intake(mL/kg) 1666.5 (32.7)    Urine (mL/kg/hr) 450 (0.4)    Total Output 450    Net +1216.5           A:  Renal isuff P:   -hold diuretics -gentle hydration   GASTROINTESTINAL  Lab 12/01/11 1728  AST 25  ALT 17  ALKPHOS 54  PROT 6.6  ALBUMIN 3.3*    A:  RP bleed P:   -stop coumadin -reverse inr -transfuse as needed   HEMATOLOGIC  Lab 12/03/11 0649 12/03/11 0250 12/02/11 1804 12/01/11 1728  HGB 9.2* 9.6* -- 13.2  HCT 27.6* 28.9* -- 40.0  PLT -- 139* -- 171  INR 1.48 1.71* 2.75* --  APTT -- 51* -- >200*    A:  Anemia P:  See gi  INFECTIOUS  Lab 12/03/11 0250 12/01/11 1728  PROCALCITON -- --  WBC 9.4 10.1    A:  No acute issue P:     ENDOCRINE CBG No results found for  this basename: GLUCAP:5 in the last 168 hours  A:  No acute issue  P:     NEUROLOGIC  A: No acute issue P:     SUMMARY: 78 yo, very functional, family reasonable, LCB (no painful or futile procedures) therefore we will; A. LCB B. No CVL C. Transfuse only one unit of PRBC rather than 2. D. Normalize INR. Trixie Dredge in SDU    [facility] ACNP Adolph Pollack PCCM Pager [phone] till 3 pm If no answer page [phone] 12/03/2011, 7:58 AM   PCCM ATTENDING: I have interviewed and examined the patient and reviewed the database. I have formulated the assessment and plan as reflected in the note above with amendments made by me. Bleeding appears to have abated. Hemorrhagic shock is resolved. Coagulopathy is reversed. She has received excellent care. Her arrhythmias are being addressed by Cardiology. There is little further for [place] to add to her current excellent mgmt. Suggest that warfarin be discontinued permanently. Please call if we can be of further assistance     Billy Fischer, MD;  PCCM service; [place] [phone] Pulmonary and Critical Care Medicine [facility] Pager: [phone]  12/03/2011, 7:58 AM

## 2011-12-03 NOTE — Progress Notes (Signed)
Subjective: HR in the 150s. See previous shift notes written by this NP tonight.   Objective: Vital signs in last 24 hours: Temp:  [97.6 F (36.4 C)-98.9 F (37.2 C)] 98.7 F (37.1 C) (11/06 0029) Pulse Rate:  [59-147] 143  (11/06 0243) Resp:  [18-20] 18  (11/06 0243) BP: (78-117)/(49-68) 94/64 mmHg (11/06 0243) SpO2:  [98 %-100 %] 98 % (11/05 1411) Weight:  [49.2 kg (108 lb 7.5 oz)] 49.2 kg (108 lb 7.5 oz) (11/05 1610) Weight change:     Intake/Output from previous day: 11/05 0701 - 11/06 0700 In: 1346.5 [P.O.:720; Blood:626.5] Out: 450 [Urine:450] Intake/Output this shift: Total I/O In: 300 [Blood:300] Out: -   PE: unchanged from previous PE in progress note on this NPs shift. Except for HR.   Lab Results:  Basename 12/03/11 0250 12/01/11 1728  WBC 9.4 10.1  HGB 9.6* 13.2  HCT 28.9* 40.0  PLT 139* 171   BMET  Basename 12/03/11 0250 12/02/11 0405  NA 134* 137  K 4.0 4.4  CL 96 99  CO2 28 28  GLUCOSE 85 130*  BUN 47* 45*  CREATININE 1.43* 1.46*  CALCIUM 8.7 9.0    Studies/Results: X-ray Chest Pa And Lateral  12/01/2011  *RADIOLOGY REPORT*  Clinical Data: 78 year old female with cough and hemoptysis.  CHEST - 2 VIEW  Comparison: 06/21/2010 and earlier.  Findings: Stable left chest cardiac pacemaker.  Lower lung volumes. Crowding of markings at both lung bases, some patchy opacity.  No definite consolidation or effusion.  No pneumothorax or edema. Stable cardiac size and mediastinal contours.  Osteopenia. Multilevel spinal compression deformities, no definite acute osseous abnormality.  IMPRESSION: Low lung volumes with patchy bibasilar opacity which most resembles atelectasis.   Original Report Authenticated By: Erskine Speed, M.D.    Ct Abdomen Pelvis W Contrast  12/02/2011  *RADIOLOGY REPORT*  Clinical Data: Lower abdominal pain since last night, evaluate for GI bleed or hematoma  CT ABDOMEN AND PELVIS WITH CONTRAST  Technique:  Multidetector CT imaging of the  abdomen and pelvis was performed following the standard protocol during bolus administration of intravenous contrast.  Contrast: 75mL OMNIPAQUE IOHEXOL 300 MG/ML  SOLN  Comparison: None.  Findings:  There is a moderate amount of high-density fluid compatible with blood within the pelvic cul-de-sac extending along the bilateral pericolic gutters to extend to involve the inferior aspect of the liver and spleen.   There is a very subtle contour abnormality within the peripheral aspect of the spleen (image 16.  No definitive evidence of contrast extravasation at this location.  There is a short segment loop of small bowel within the midline of the lower pelvis (axial images 56 through 58) which demonstrates wall thickening.  There is minimal similar inflammatory change with an additional short segment of small bowel within the left hemi pelvis (image 56, series 2).  Neither of these loops of bowel result in enteric obstruction.  No pneumoperitoneum, pneumatosis or portal venous gas.  Extensive atherosclerotic calcifications of a normal caliber abdominal aorta.  The proximal aspects of the major branch vessels of the abdominal aorta, including the IMA, appear patent on this non CTA examination.  Normal appearance of the retrocecal appendix.  Normal hepatic contour.  Note is made of an approximately 1.2 cm hypoattenuating (11 HU) cyst within the anterior segment right lobe of the liver (image 17, series 2).  There is an additional approximately 1.5 cm cyst within the posterior segment right lobe of the liver (image 13).  Additional smaller sub centimeter hypoattenuating hepatic lesions are too small to accurately characterize the favored to represent additional hepatic cysts. Normal appearance of the gallbladder.  No definite intra or extrahepatic biliary ductal dilatation.  There is a serpiginous hypoattenuating structure regional to the pancreatic head (coronal images 45 through 48), measuring approximately 1.1 x 2.2 cm  in greatest oblique axial dimension (axial image 26, series 2).  This structure is without associated pancreatic ductal dilatation.  There is age appropriate pancreatic atrophy.  Scattered calcifications within the spleen, sequela of prior granulomas infection.  There is symmetric enhancement and excretion of the bilateral kidneys.  Incidental note is made of a right-sided parapelvic cyst. Additional bilateral hypoattenuating lesions are too small to accurately characterize the favored to represent additional renal cysts.  No urinary obstruction. No definite renal stones.  No urinary obstruction or perinephric stranding.  Normal appearance of the bilateral adrenal glands.  Limited visualization lower thorax is negative for focal airspace opacity.  Cardiomegaly.  Pacer leads seen within the right atrium and ventricle.  Calcifications within the papillary musculature of the left ventricle.  No pericardial effusion.  Age indeterminate moderate (approximately 50%) compression deformity of the L1 vertebral body.  No retropulsion of the posterior elements.  Moderate to severe multilevel lumbar spine degenerative change, worse at L4 - L5.  Post pinning of the left femoral neck.  IMPRESSION:  1.  Mild to moderate amounts of hemoperitoneum, the etiology of which is indeterminate on this examination.  Differential considerations include possible splenic laceration as there is a subtle contour abnormality within the peripheral aspect of the spleen (however this is without definitive contrast extravasation). Another etiology are two short segment abnormal loops of small bowel within the midline and left lateral aspect of the pelvis. Given the geographic involvement of the loops of small bowel as well as extensive atherosclerotic disease within the abdominal aorta, mesenteric ischemia is a possible differential possibility.  2.  Serpiginous hypoattenuating structure regional to the pancreatic head may represent a side branch  IPMA.  This finding is without associated pancreatic ductal dilatation or peripancreatic stranding.  Comparison to prior examinations (if available) is recommended.  If no comparisons exist, further evaluation with non emergent pancreatic protocol MRI may be performed as clinically indicated. 3.  Age indeterminate moderate (approximately 50%) compression deformity of the L1 vertebral body.  Above findings discussed with Mahala Menghini, MD at 1921.   Original Report Authenticated By: Tacey Ruiz, MD     Medications: see new orders  Assessment/Plan: 1. Sinus tach-pt has a hx of tachybrady syndrome. Coupled with 4 gm drop in Hgb is likely cause. Given her BP will likely continue to decrease with tachy, will give NS bolus. She doesn't appear to be in volume overload, but will cont to watch for this. Also, blood to be given which should help HR. Have not seen result of Digoxin IV yet. Trop drawn given change in status and is neg.  2. Hypotension-has been low, but stable and she has been non symptomatic tonight. See #1. 3. Hemoperitoneum-even though Hgb in the 9's, she has had an almost 4 gm drop and since she now has sinus tach, feel it is prudent to consider transfusion after FFP given. TF 2 U and stay 2 U ahead. Surgery has seen pt and no surgical intervention warranted at this time.  4. CHF-watch for volume overload.  5. Anti-coagulopathy-has improved. INR now 1.71. TF FFP x additional 1 and cont to follow. But, likely haven't seen  full effect of Vit K 2mg  given on this shift. Cont to hold Coumadin and ASA.  I discussed this case thoroughly with Dr. Gery Pray. She is aware of issues and treatment plan.   Will attempt to call daughter, but RN tried and couldn't reach her.    LOS: 2 days   Rachel Shah, Rachel Shah 12/03/2011, 4:13 AM

## 2011-12-03 NOTE — Progress Notes (Signed)
F/up note: NP called RN to check on pt.  Pt's vitals cont to remain stable. Pt remains hypotensive, but asymptomatic. Has received all 3 units of FFP now, so can order labs for about an hour from now. See orders. Will cont to follow closely.  Maren Reamer, NP [facility]

## 2011-12-03 NOTE — Progress Notes (Signed)
Subjective: C/o abd pain. Pt became tachycardiac and moved to SDU. BP a little soft in the mid 90s  Objective: Vital signs in last 24 hours: Temp:  [97.6 F (36.4 C)-98.9 F (37.2 C)] 97.7 F (36.5 C) (11/06 0350) Pulse Rate:  [59-162] 161  (11/06 0415) Resp:  [14-25] 14  (11/06 0415) BP: (78-117)/(49-68) 92/59 mmHg (11/06 0415) SpO2:  [98 %-100 %] 100 % (11/06 0415) Weight:  [108 lb 7.5 oz (49.2 kg)-112 lb 7 oz (51 kg)] 112 lb 7 oz (51 kg) (11/06 0350)    Intake/Output from previous day: 11/05 0701 - 11/06 0700 In: 1346.5 [P.O.:720; Blood:626.5] Out: 450 [Urine:450] Intake/Output this shift: Total I/O In: 300 [Blood:300] Out: -   Alert, non-toxic abd -soft, full, no RT/guarding/peritonitis; mild TTP - unchanged to me  Lab Results:   [facility] 12/03/11 0250 12/01/11 1728  WBC 9.4 10.1  HGB 9.6* 13.2  HCT 28.9* 40.0  PLT 139* 171   BMET  Basename 12/03/11 0250 12/02/11 0405  NA 134* 137  K 4.0 4.4  CL 96 99  CO2 28 28  GLUCOSE 85 130*  BUN 47* 45*  CREATININE 1.43* 1.46*  CALCIUM 8.7 9.0   PT/INR  Basename 12/03/11 0250 12/02/11 1804  LABPROT 19.5* 27.7*  INR 1.71* 2.75*   ABG No results found for this basename: PHART:2,PCO2:2,PO2:2,HCO3:2 in the last 72 hours  Studies/Results: X-ray Chest Pa And Lateral  12/01/2011  *RADIOLOGY REPORT*  Clinical Data: 78 year old female with cough and hemoptysis.  CHEST - 2 VIEW  Comparison: 06/21/2010 and earlier.  Findings: Stable left chest cardiac pacemaker.  Lower lung volumes. Crowding of markings at both lung bases, some patchy opacity.  No definite consolidation or effusion.  No pneumothorax or edema. Stable cardiac size and mediastinal contours.  Osteopenia. Multilevel spinal compression deformities, no definite acute osseous abnormality.  IMPRESSION: Low lung volumes with patchy bibasilar opacity which most resembles atelectasis.   Original Report Authenticated By: Erskine Speed, M.D.    Ct Abdomen Pelvis W  Contrast  12/02/2011  *RADIOLOGY REPORT*  Clinical Data: Lower abdominal pain since last night, evaluate for GI bleed or hematoma  CT ABDOMEN AND PELVIS WITH CONTRAST  Technique:  Multidetector CT imaging of the abdomen and pelvis was performed following the standard protocol during bolus administration of intravenous contrast.  Contrast: 75mL OMNIPAQUE IOHEXOL 300 MG/ML  SOLN  Comparison: None.  Findings:  There is a moderate amount of high-density fluid compatible with blood within the pelvic cul-de-sac extending along the bilateral pericolic gutters to extend to involve the inferior aspect of the liver and spleen.   There is a very subtle contour abnormality within the peripheral aspect of the spleen (image 16.  No definitive evidence of contrast extravasation at this location.  There is a short segment loop of small bowel within the midline of the lower pelvis (axial images 56 through 58) which demonstrates wall thickening.  There is minimal similar inflammatory change with an additional short segment of small bowel within the left hemi pelvis (image 56, series 2).  Neither of these loops of bowel result in enteric obstruction.  No pneumoperitoneum, pneumatosis or portal venous gas.  Extensive atherosclerotic calcifications of a normal caliber abdominal aorta.  The proximal aspects of the major branch vessels of the abdominal aorta, including the IMA, appear patent on this non CTA examination.  Normal appearance of the retrocecal appendix.  Normal hepatic contour.  Note is made of an approximately 1.2 cm hypoattenuating (11 HU) cyst  within the anterior segment right lobe of the liver (image 17, series 2).  There is an additional approximately 1.5 cm cyst within the posterior segment right lobe of the liver (image 13).  Additional smaller sub centimeter hypoattenuating hepatic lesions are too small to accurately characterize the favored to represent additional hepatic cysts. Normal appearance of the gallbladder.   No definite intra or extrahepatic biliary ductal dilatation.  There is a serpiginous hypoattenuating structure regional to the pancreatic head (coronal images 45 through 48), measuring approximately 1.1 x 2.2 cm in greatest oblique axial dimension (axial image 26, series 2).  This structure is without associated pancreatic ductal dilatation.  There is age appropriate pancreatic atrophy.  Scattered calcifications within the spleen, sequela of prior granulomas infection.  There is symmetric enhancement and excretion of the bilateral kidneys.  Incidental note is made of a right-sided parapelvic cyst. Additional bilateral hypoattenuating lesions are too small to accurately characterize the favored to represent additional renal cysts.  No urinary obstruction. No definite renal stones.  No urinary obstruction or perinephric stranding.  Normal appearance of the bilateral adrenal glands.  Limited visualization lower thorax is negative for focal airspace opacity.  Cardiomegaly.  Pacer leads seen within the right atrium and ventricle.  Calcifications within the papillary musculature of the left ventricle.  No pericardial effusion.  Age indeterminate moderate (approximately 50%) compression deformity of the L1 vertebral body.  No retropulsion of the posterior elements.  Moderate to severe multilevel lumbar spine degenerative change, worse at L4 - L5.  Post pinning of the left femoral neck.  IMPRESSION:  1.  Mild to moderate amounts of hemoperitoneum, the etiology of which is indeterminate on this examination.  Differential considerations include possible splenic laceration as there is a subtle contour abnormality within the peripheral aspect of the spleen (however this is without definitive contrast extravasation). Another etiology are two short segment abnormal loops of small bowel within the midline and left lateral aspect of the pelvis. Given the geographic involvement of the loops of small bowel as well as extensive  atherosclerotic disease within the abdominal aorta, mesenteric ischemia is a possible differential possibility.  2.  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  This finding is without associated pancreatic ductal dilatation or peripancreatic stranding.  Comparison to prior examinations (if available) is recommended.  If no comparisons exist, further evaluation with non emergent pancreatic protocol MRI may be performed as clinically indicated. 3.  Age indeterminate moderate (approximately 50%) compression deformity of the L1 vertebral body.  Above findings discussed with Mahala Menghini, MD at 1921.   Original Report Authenticated By: Tacey Ruiz, MD     Anti-infectives: Anti-infectives     Start     Dose/Rate Route Frequency Ordered Stop   12/03/11 1200   vancomycin (VANCOCIN) 2,000 mg in sodium chloride 0.9 % 250 mL IVPB  Status:  Discontinued        2,000 mg 125 mL/hr over 120 Minutes Intravenous Every M-W-F (Hemodialysis) 12/03/11 0311 12/03/11 0312          Assessment/Plan: s/p * No surgery found * Hemoperitoneum likely to due to spontaneous bleed from supratherapeutic INR ABL anemia  I would give the pt an additional unit of FFP and then 1u PRBC and then repeat coags and cbc. Would check hgb/hct q6 -8hrs. Keep npo rec CCM consult to help manage volume status.  The pt does not have peritonitis.  I believe she needs ongoing resuscitation to correct  her anemia and coagulopathy. At this time, I do not believe surgical exploration is warranted. If pt continues to have decreasing HGB (without a change - worsening in her abdominal exam) then angiography with possible embolization may be indicated.  I also believe goals of care should start to be addressed Will follow  Mary Sella. Andrey Campanile, MD, FACS General, Bariatric, & Minimally Invasive Surgery [facility], [place]   LOS: 2 days    Atilano Ina 12/03/2011

## 2011-12-03 NOTE — Progress Notes (Signed)
At 1040, pt's HR back up to 148 ST, BP 77/53.  Pt asymptomatic.  Dr. Johney Frame paged with above information.  New orders received for additional dose of adenosine.  Rapid Response RN to bedside for administration of medication.  After med given, HR down to SR 60s, BP 106/57.  Will continue to monitor.  Roselie Awkward, RN

## 2011-12-03 NOTE — Progress Notes (Addendum)
Patient: Rachel Shah / Admit Date: 12/01/2011 / Date of Encounter: 12/03/2011, 6:45 AM   Subjective  Events overnight noted - CT showed hemoperitoneum and Hgb 13.2->9.6 overnight. Receiving FFP per primary team. Pt c/o abd soreness but says she is hungry. No CP or SOB.   Objective   Telemetry: regular tachycardia 150-160s ?SVT versus sinus tach Physical Exam: Filed Vitals:   12/03/11 0615  BP: 84/57  Pulse: 160  Temp: 97.6 F (36.4 C)  Resp: 20   General: Frail elderly WF in no acute distress. Head: Normocephalic, atraumatic, sclera non-icteric, no xanthomas, nares are without discharge. Neck: JVD not elevated. No carotid bruits. Gentle carotid massage and coughing did not alleviate arrhythmia. Lungs: Clear bilaterally to auscultation without wheezes, rales, or rhonchi. Breathing is unlabored. Heart: tachycardic Abdomen: Slightly distended, normoactive bowel sounds. No external ecchymosis. Msk:  Strength and tone appear normal for age. Extremities: No clubbing or cyanosis. No edema.  Distal pedal pulses are 2+ and equal bilaterally. Neuro: Alert and oriented X 3 but extremely hard of hearing. Moves all extremities spontaneously. Psych:  Responds to questions appropriately with a normal affect.    Intake/Output Summary (Last 24 hours) at 12/03/11 0645 Last data filed at 12/03/11 0615  Gross per 24 hour  Intake 1666.5 ml  Output    450 ml  Net 1216.5 ml    Inpatient Medications:  Labs reviewed  Labs:  [facility] 12/03/11 0250 12/02/11 0405  NA 134* 137  K 4.0 4.4  CL 96 99  CO2 28 28  GLUCOSE 85 130*  BUN 47* 45*  CREATININE 1.43* 1.46*  CALCIUM 8.7 9.0  MG -- --  PHOS -- --    Basename 12/01/11 1728  AST 25  ALT 17  ALKPHOS 54  BILITOT 1.6*  PROT 6.6  ALBUMIN 3.3*    Basename 12/03/11 0250 12/01/11 1728  WBC 9.4 10.1  NEUTROABS -- 8.4*  HGB 9.6* 13.2  HCT 28.9* 40.0  MCV 92.3 93.7  PLT 139* 171    Basename 12/03/11 0300 12/02/11 0405 12/01/11  2205 12/01/11 1728  CKTOTAL -- -- -- --  CKMB -- -- -- --  TROPONINI <0.30 <0.30 <0.30 <0.30   Radiology/Studies:  X-ray Chest Pa And Lateral 12/01/2011  *RADIOLOGY REPORT*  Clinical Data: 78 year old female with cough and hemoptysis.  CHEST - 2 VIEW  Comparison: 06/21/2010 and earlier.  Findings: Stable left chest cardiac pacemaker.  Lower lung volumes. Crowding of markings at both lung bases, some patchy opacity.  No definite consolidation or effusion.  No pneumothorax or edema. Stable cardiac size and mediastinal contours.  Osteopenia. Multilevel spinal compression deformities, no definite acute osseous abnormality.  IMPRESSION: Low lung volumes with patchy bibasilar opacity which most resembles atelectasis.   Original Report Authenticated By: Erskine Speed, M.D.    Ct Abdomen Pelvis W Contrast 12/02/2011  *RADIOLOGY REPORT*  Clinical Data: Lower abdominal pain since last night, evaluate for GI bleed or hematoma  CT ABDOMEN AND PELVIS WITH CONTRAST  Technique:  Multidetector CT imaging of the abdomen and pelvis was performed following the standard protocol during bolus administration of intravenous contrast.  Contrast: 75mL OMNIPAQUE IOHEXOL 300 MG/ML  SOLN  Comparison: None.  Findings:  There is a moderate amount of high-density fluid compatible with blood within the pelvic cul-de-sac extending along the bilateral pericolic gutters to extend to involve the inferior aspect of the liver and spleen.   There is a very subtle contour abnormality within the peripheral aspect of the spleen (image  16.  No definitive evidence of contrast extravasation at this location.  There is a short segment loop of small bowel within the midline of the lower pelvis (axial images 56 through 58) which demonstrates wall thickening.  There is minimal similar inflammatory change with an additional short segment of small bowel within the left hemi pelvis (image 56, series 2).  Neither of these loops of bowel result in enteric  obstruction.  No pneumoperitoneum, pneumatosis or portal venous gas.  Extensive atherosclerotic calcifications of a normal caliber abdominal aorta.  The proximal aspects of the major branch vessels of the abdominal aorta, including the IMA, appear patent on this non CTA examination.  Normal appearance of the retrocecal appendix.  Normal hepatic contour.  Note is made of an approximately 1.2 cm hypoattenuating (11 HU) cyst within the anterior segment right lobe of the liver (image 17, series 2).  There is an additional approximately 1.5 cm cyst within the posterior segment right lobe of the liver (image 13).  Additional smaller sub centimeter hypoattenuating hepatic lesions are too small to accurately characterize the favored to represent additional hepatic cysts. Normal appearance of the gallbladder.  No definite intra or extrahepatic biliary ductal dilatation.  There is a serpiginous hypoattenuating structure regional to the pancreatic head (coronal images 45 through 48), measuring approximately 1.1 x 2.2 cm in greatest oblique axial dimension (axial image 26, series 2).  This structure is without associated pancreatic ductal dilatation.  There is age appropriate pancreatic atrophy.  Scattered calcifications within the spleen, sequela of prior granulomas infection.  There is symmetric enhancement and excretion of the bilateral kidneys.  Incidental note is made of a right-sided parapelvic cyst. Additional bilateral hypoattenuating lesions are too small to accurately characterize the favored to represent additional renal cysts.  No urinary obstruction. No definite renal stones.  No urinary obstruction or perinephric stranding.  Normal appearance of the bilateral adrenal glands.  Limited visualization lower thorax is negative for focal airspace opacity.  Cardiomegaly.  Pacer leads seen within the right atrium and ventricle.  Calcifications within the papillary musculature of the left ventricle.  No pericardial  effusion.  Age indeterminate moderate (approximately 50%) compression deformity of the L1 vertebral body.  No retropulsion of the posterior elements.  Moderate to severe multilevel lumbar spine degenerative change, worse at L4 - L5.  Post pinning of the left femoral neck.  IMPRESSION:  1.  Mild to moderate amounts of hemoperitoneum, the etiology of which is indeterminate on this examination.  Differential considerations include possible splenic laceration as there is a subtle contour abnormality within the peripheral aspect of the spleen (however this is without definitive contrast extravasation). Another etiology are two short segment abnormal loops of small bowel within the midline and left lateral aspect of the pelvis. Given the geographic involvement of the loops of small bowel as well as extensive atherosclerotic disease within the abdominal aorta, mesenteric ischemia is a possible differential possibility.  2.  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  This finding is without associated pancreatic ductal dilatation or peripancreatic stranding.  Comparison to prior examinations (if available) is recommended.  If no comparisons exist, further evaluation with non emergent pancreatic protocol MRI may be performed as clinically indicated. 3.  Age indeterminate moderate (approximately 50%) compression deformity of the L1 vertebral body.  Above findings discussed with Mahala Menghini, MD at 1921.   Original Report Authenticated By: Tacey Ruiz, MD     Assessment and Plan  1. Hemoperitoneum in setting of supratherapeutic INR 2. ABL anemia 3. SVT 4. H/o paroxysmal atrial fibrillation/SVT & tachybrady syndrome s/p pacemaker 5. Failure to thrive 6. Acute renal insufficiency 7. Prior hx of both prior diastolic & systolic CHF but normal LV function by technically limited echo 12/02/11  Discussed case with Dr. Johney Frame who will be seeing patient this AM.  Received IV digoxin overnight without effect; carotid massage was ineffective. Cannot use most AV nodal agents due to hypotension - will discuss adenosine use. Tentatively we recommend to treat underlying arrythmia conservatively with volume resuscitation as you are doing including blood products directed by primary team. She does not appear volume overloaded at present.   Signed, Ronie Spies PA-C   I have seen, examined the patient, and reviewed the above assessment and plan.  Changes to above are made where necessary.  Pt is critically ill with active hemoperitoneum.  She has received both FFP and Vitamin K.  She does not appear significantly dehyrated or volume overloaded.  Would therefore keep Is and Os about even.  If necessary, replace with PRBCs given active bleed (PCCM managing).  She was in a short RP SVT tachycardia this am which terminated with adenosine 6mg  IV.   We will follow closely.  Given her advanced age and comorbidities, I think that her prognosis remains very guarded.  Ultimately, a conservative approach is probably best.  I appreciate the assistance of Internal Medicine and PCCM.  Co Sign: Hillis Range, MD 12/03/2011 7:59 AM   I had a long discussion with the patients daughter who is her POA.  She understands that the patient is very ill.  She would like for [place] to continue medical therapy but while keeping the patient comfortable when able.  She is very clear that wishes are for not chest compression or intubation.  Should her condition continue to decline, we may consider palliative measures at that time.  Fayrene Fearing [place],MD

## 2011-12-03 NOTE — Progress Notes (Signed)
[facility] PROGRESS NOTE  Rachel Shah ZOX:[id number] DOB: 09/25/1910 DOA: 12/01/2011 PCP: Eustaquio Boyden, MD  Assessment/Plan: 1. Hemorrhagic shock due to hemoperitoneum in the setting of overanticoagulation due to coumadin s/p 1 units of PRBCS and 2 units of FFP. INR corrected by 12/03/11 2. Acute blood loss anemia - Hb dropped from 13.2 to 9.2 . Post transfusion hb is up to 10.4 on 12/03/11  3. Tachybrady syndrome - s/p pacemaker  4. HTN 5. HL 6. SVT - resolved with Adenosine iv 12/03/11 Code Status: Limited  Family Communication: daughter at bedside  Disposition Plan: ?   Consultants:  PCCM  Cardiology   Procedures:  CT  Antibiotics:  HPI/Subjective: Hungry would like food  Objective: Filed Vitals:   12/03/11 0700 12/03/11 0747 12/03/11 0800 12/03/11 0823  BP: 75/49 82/60 83/46  81/47  Pulse: 160 157 76 73  Temp:   97.9 F (36.6 C) 97.8 F (36.6 C)  TempSrc:   Oral Oral  Resp: 27 24 21 24   Height:      Weight:      SpO2: 100% 99% 99%     Intake/Output Summary (Last 24 hours) at 12/03/11 0929 Last data filed at 12/03/11 0615  Gross per 24 hour  Intake 1186.5 ml  Output    200 ml  Net  986.5 ml   Filed Weights   12/01/11 1630 12/02/11 0643 12/03/11 0350  Weight: 48.5 kg (106 lb 14.8 oz) 49.2 kg (108 lb 7.5 oz) 51 kg (112 lb 7 oz)    Exam:   General:  axox3  Cardiovascular: RRR  Respiratory: CTAB  Abdomen: soft, tender  Data Reviewed: Basic Metabolic Panel:  Lab 12/03/11 8119 12/02/11 0405 12/01/11 1728  NA 134* 137 135  K 4.0 4.4 4.0  CL 96 99 97  CO2 28 28 26   GLUCOSE 85 130* 107*  BUN 47* 45* 45*  CREATININE 1.43* 1.46* 1.40*  CALCIUM 8.7 9.0 8.9  MG -- -- --  PHOS -- -- --   Liver Function Tests:  Lab 12/01/11 1728  AST 25  ALT 17  ALKPHOS 54  BILITOT 1.6*  PROT 6.6  ALBUMIN 3.3*   No results found for this basename: LIPASE:5,AMYLASE:5 in the last 168 hours No results found for this basename: AMMONIA:5 in the  last 168 hours CBC:  Lab 12/03/11 0649 12/03/11 0250 12/01/11 1728  WBC -- 9.4 10.1  NEUTROABS -- -- 8.4*  HGB 9.2* 9.6* 13.2  HCT 27.6* 28.9* 40.0  MCV -- 92.3 93.7  PLT -- 139* 171   Cardiac Enzymes:  Lab 12/03/11 0300 12/02/11 0405 12/01/11 2205 12/01/11 1728  CKTOTAL -- -- -- --  CKMB -- -- -- --  CKMBINDEX -- -- -- --  TROPONINI <0.30 <0.30 <0.30 <0.30   BNP (last 3 results)  Basename 12/01/11 1728  PROBNP 867.7*   CBG: No results found for this basename: GLUCAP:5 in the last 168 hours  Recent Results (from the past 240 hour(s))  MRSA PCR SCREENING     Status: Normal   Collection Time   12/03/11  3:55 AM      Component Value Range Status Comment   MRSA by PCR NEGATIVE  NEGATIVE Final      Studies: X-ray Chest Pa And Lateral  12/01/2011  *RADIOLOGY REPORT*  Clinical Data: 78 year old female with cough and hemoptysis.  CHEST - 2 VIEW  Comparison: 06/21/2010 and earlier.  Findings: Stable left chest cardiac pacemaker.  Lower lung volumes. Crowding of markings at both lung  bases, some patchy opacity.  No definite consolidation or effusion.  No pneumothorax or edema. Stable cardiac size and mediastinal contours.  Osteopenia. Multilevel spinal compression deformities, no definite acute osseous abnormality.  IMPRESSION: Low lung volumes with patchy bibasilar opacity which most resembles atelectasis.   Original Report Authenticated By: Erskine Speed, M.D.    Ct Abdomen Pelvis W Contrast  12/02/2011  *RADIOLOGY REPORT*  Clinical Data: Lower abdominal pain since last night, evaluate for GI bleed or hematoma  CT ABDOMEN AND PELVIS WITH CONTRAST  Technique:  Multidetector CT imaging of the abdomen and pelvis was performed following the standard protocol during bolus administration of intravenous contrast.  Contrast: 75mL OMNIPAQUE IOHEXOL 300 MG/ML  SOLN  Comparison: None.  Findings:  There is a moderate amount of high-density fluid compatible with blood within the pelvic cul-de-sac  extending along the bilateral pericolic gutters to extend to involve the inferior aspect of the liver and spleen.   There is a very subtle contour abnormality within the peripheral aspect of the spleen (image 16.  No definitive evidence of contrast extravasation at this location.  There is a short segment loop of small bowel within the midline of the lower pelvis (axial images 56 through 58) which demonstrates wall thickening.  There is minimal similar inflammatory change with an additional short segment of small bowel within the left hemi pelvis (image 56, series 2).  Neither of these loops of bowel result in enteric obstruction.  No pneumoperitoneum, pneumatosis or portal venous gas.  Extensive atherosclerotic calcifications of a normal caliber abdominal aorta.  The proximal aspects of the major branch vessels of the abdominal aorta, including the IMA, appear patent on this non CTA examination.  Normal appearance of the retrocecal appendix.  Normal hepatic contour.  Note is made of an approximately 1.2 cm hypoattenuating (11 HU) cyst within the anterior segment right lobe of the liver (image 17, series 2).  There is an additional approximately 1.5 cm cyst within the posterior segment right lobe of the liver (image 13).  Additional smaller sub centimeter hypoattenuating hepatic lesions are too small to accurately characterize the favored to represent additional hepatic cysts. Normal appearance of the gallbladder.  No definite intra or extrahepatic biliary ductal dilatation.  There is a serpiginous hypoattenuating structure regional to the pancreatic head (coronal images 45 through 48), measuring approximately 1.1 x 2.2 cm in greatest oblique axial dimension (axial image 26, series 2).  This structure is without associated pancreatic ductal dilatation.  There is age appropriate pancreatic atrophy.  Scattered calcifications within the spleen, sequela of prior granulomas infection.  There is symmetric enhancement and  excretion of the bilateral kidneys.  Incidental note is made of a right-sided parapelvic cyst. Additional bilateral hypoattenuating lesions are too small to accurately characterize the favored to represent additional renal cysts.  No urinary obstruction. No definite renal stones.  No urinary obstruction or perinephric stranding.  Normal appearance of the bilateral adrenal glands.  Limited visualization lower thorax is negative for focal airspace opacity.  Cardiomegaly.  Pacer leads seen within the right atrium and ventricle.  Calcifications within the papillary musculature of the left ventricle.  No pericardial effusion.  Age indeterminate moderate (approximately 50%) compression deformity of the L1 vertebral body.  No retropulsion of the posterior elements.  Moderate to severe multilevel lumbar spine degenerative change, worse at L4 - L5.  Post pinning of the left femoral neck.  IMPRESSION:  1.  Mild to moderate amounts of hemoperitoneum, the etiology of which  is indeterminate on this examination.  Differential considerations include possible splenic laceration as there is a subtle contour abnormality within the peripheral aspect of the spleen (however this is without definitive contrast extravasation). Another etiology are two short segment abnormal loops of small bowel within the midline and left lateral aspect of the pelvis. Given the geographic involvement of the loops of small bowel as well as extensive atherosclerotic disease within the abdominal aorta, mesenteric ischemia is a possible differential possibility.  2.  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  This finding is without associated pancreatic ductal dilatation or peripancreatic stranding.  Comparison to prior examinations (if available) is recommended.  If no comparisons exist, further evaluation with non emergent pancreatic protocol MRI may be performed as clinically indicated. 3.  Age  indeterminate moderate (approximately 50%) compression deformity of the L1 vertebral body.  Above findings discussed with Mahala Menghini, MD at 1921.   Original Report Authenticated By: Tacey Ruiz, MD     Scheduled Meds:   . [COMPLETED] adenosine (ADENOCARD) IV  6 mg Intravenous Once  . [COMPLETED] adenosine      . [COMPLETED] digoxin  0.125 mg Intravenous Once  . [COMPLETED] iohexol  20 mL Oral Q1 Hr x 2  . pantoprazole (PROTONIX) IV  40 mg Intravenous Q12H  . [COMPLETED] phytonadione  2 mg Subcutaneous Once  . [COMPLETED] sodium chloride  500 mL Intravenous Once  . sodium chloride  3 mL Intravenous Q12H  . [COMPLETED] white petrolatum      . [DISCONTINUED] atorvastatin  10 mg Oral QPC supper  . [DISCONTINUED] furosemide  20 mg Intravenous Once  . [DISCONTINUED] furosemide  40 mg Oral Daily  . [DISCONTINUED] lisinopril  5 mg Oral Daily  . [DISCONTINUED] metoprolol  50 mg Oral BID  . [DISCONTINUED] metoprolol  12.5 mg Oral BID  . [DISCONTINUED] potassium chloride  20 mEq Oral Daily  . [DISCONTINUED] sodium chloride  500 mL Intravenous Once  . [DISCONTINUED] spironolactone  25 mg Oral Daily  . [DISCONTINUED] vancomycin  2,000 mg Intravenous Q M,W,F-HD   Continuous Infusions:   Active Problems:  Tachy-brady syndrome  Atrial fibrillation  Nocturnal dyspnea/choking  Pacemaker-MDT  DVT (deep venous thrombosis)  Chronic systolic heart failure  Failure to thrive  Abdominal pain  SVT (supraventricular tachycardia)    Time spent: 45 minutes    Rachel Shah  [facility] Pager [phone]. If 8PM-8AM, please contact night-coverage at [email or web address], password [facility] 12/03/2011, 9:29 AM  LOS: 2 days

## 2011-12-03 NOTE — Progress Notes (Signed)
INITIAL ADULT NUTRITION ASSESSMENT Date: 12/03/2011   Time: 12:59 PM  Reason for Assessment: Nutrition Risk (MST=2)  INTERVENTION: Ensure Complete PO BID, each supplement provides 350 kcal and 13 grams of protein.  DOCUMENTATION CODES Per approved criteria  -Not Applicable   ASSESSMENT: Female 78 y.o.  Dx: FTT; Dyspnea  Hx:  Past Medical History  Diagnosis Date  . Atrial flutter   . Supraventricular tachycardia   . Tachy-brady syndrome     post [employer] revealed pulse generator dual lead pacemaker  . CHF (congestive heart failure)     a. hx of diastolic CHF;  b. recent dx with systolic CHF  . Angina at rest   . Transient ischemic attack 2007    Remote history of  . Fibroids     History of uterine fibroids  . Thrombocytopenia     Mild  . Abnormal thyroid function test     Mildly abnormal thyroid function studies with a TSH of 7.984 (upper limit of normal 4.5), free T4 within normal limits at 1.21 and T3  slightly low at 69.2 (lower limit of normal 80), follow up with primary care  . Hypertension   . High cholesterol   . Pacemaker   . Shortness of breath   . History of blood transfusion 12/02/2011  . Lower GI bleeding     "dr told me last year that I might ought to have OR but not at my age" (12/02/2011)  . Hard of hearing     bilaterally "(12/02/2011)    Past Surgical History  Procedure Date  . Cataract extraction w/ intraocular lens implant 2012    left  . Dilation and curettage of uterus   . Cardiac pacemaker placement ?02/2010    Related Meds:  Scheduled Meds:   . [COMPLETED] adenosine (ADENOCARD) IV  6 mg Intravenous Once  . [COMPLETED] adenosine (ADENOCARD) IV  6 mg Intravenous Once  . [COMPLETED] adenosine      . [COMPLETED] digoxin  0.125 mg Intravenous Once  . diltiazem  30 mg Oral Q6H  . [COMPLETED] iohexol  20 mL Oral Q1 Hr x 2  . pantoprazole (PROTONIX) IV  40 mg Intravenous Q12H  . [COMPLETED] phytonadione  2 mg Subcutaneous Once  . [COMPLETED]  sodium chloride  500 mL Intravenous Once  . sodium chloride  3 mL Intravenous Q12H  . [COMPLETED] white petrolatum      . [DISCONTINUED] atorvastatin  10 mg Oral QPC supper  . [DISCONTINUED] furosemide  20 mg Intravenous Once  . [DISCONTINUED] furosemide  40 mg Oral Daily  . [DISCONTINUED] lisinopril  5 mg Oral Daily  . [DISCONTINUED] metoprolol  50 mg Oral BID  . [DISCONTINUED] metoprolol  12.5 mg Oral BID  . [DISCONTINUED] potassium chloride  20 mEq Oral Daily  . [DISCONTINUED] sodium chloride  500 mL Intravenous Once  . [DISCONTINUED] spironolactone  25 mg Oral Daily  . [DISCONTINUED] vancomycin  2,000 mg Intravenous Q M,W,F-HD   Continuous Infusions:  PRN Meds:.sodium chloride, acetaminophen, [COMPLETED] iohexol, ondansetron (ZOFRAN) IV, sodium chloride, [DISCONTINUED] sodium chloride, [DISCONTINUED] sodium chloride, [DISCONTINUED] sodium chloride, [DISCONTINUED] sodium chloride, [DISCONTINUED] nitroGLYCERIN   Ht: 5' (152.4 cm)  Wt: 112 lb 7 oz (51 kg)  Ideal Wt: 45.5 kg % Ideal Wt: 112%  Usual Wt:  Wt Readings from Last 10 Encounters:  12/03/11 112 lb 7 oz (51 kg)  12/01/11 113 lb (51.256 kg)  11/10/11 111 lb (50.349 kg)  06/26/11 120 lb (54.432 kg)  12/16/10 122 lb (55.339  kg)   % Usual Wt: 93%  Body mass index is 21.96 kg/(m^2).  Food/Nutrition Related Hx: poor intake over the past 2 weeks  Labs:  CMP     Component Value Date/Time   NA 134* 12/03/2011 0250   K 4.0 12/03/2011 0250   CL 96 12/03/2011 0250   CO2 28 12/03/2011 0250   GLUCOSE 85 12/03/2011 0250   BUN 47* 12/03/2011 0250   CREATININE 1.43* 12/03/2011 0250   CALCIUM 8.7 12/03/2011 0250   PROT 6.6 12/01/2011 1728   ALBUMIN 3.3* 12/01/2011 1728   AST 25 12/01/2011 1728   ALT 17 12/01/2011 1728   ALKPHOS 54 12/01/2011 1728   BILITOT 1.6* 12/01/2011 1728   GFRNONAA 29* 12/03/2011 0250   GFRAA 34* 12/03/2011 0250      Intake/Output Summary (Last 24 hours) at 12/03/11 1354 Last data filed at 12/03/11 1122   Gross per 24 hour  Intake 1497.33 ml  Output    200 ml  Net 1297.33 ml     Diet Order: Full Liquid   IVF:  none  Estimated Nutritional Needs:   Kcal: 1250-1400 Protein: 65-75 gm Fluid: 1.3-1.5 liters  Patient's daughter reports that patient had heart failure earlier this year.  Suspects weight loss could be related to fluid loss.  Patient has been eating well, consumed 100% of full liquid lunch today.  Also likes Ensure.  Likes to eat sweets.  Weight has been stable for the past several months per daughter.  Eats 3 meals per day and snacks throughout the day at daughter's house.    NUTRITION DIAGNOSIS: Inadequate oral intake related to poor appetite with recent illness as evidenced by patient/daughter report of minimal oral intake PTA.  MONITORING/EVALUATION(Goals): Goal:  Intake to meet >90% of estimated nutrition needs. Monitor:  PO intake, labs, weight trend.  EDUCATION NEEDS: -No education needs identified at this time   Joaquin Courts, RD, LDN, CNSC Pager# [phone] After Hours Pager# [phone]  12/03/2011, 12:59 PM

## 2011-12-03 NOTE — Progress Notes (Signed)
Subjective: Patient feeling better tonight in good spirits.  She denies any N/V, abdominal pain, fever chills.  C/o mild abdominal distension.  Pt has not been up OOB yet today because of her tachycardia and hypotension.  Pt tolerating a liquid diet.  Pt denies any chest pain, SOB, or leg tenderness.    Objective: Vital signs in last 24 hours: Temp:  [97.5 F (36.4 C)-98.8 F (37.1 C)] 98.1 F (36.7 C) (11/06 1600) Pulse Rate:  [60-162] 60  (11/06 1600) Resp:  [11-27] 21  (11/06 1600) BP: (72-114)/(38-65) 114/43 mmHg (11/06 1733) SpO2:  [99 %-100 %] 100 % (11/06 1600) Weight:  [112 lb 7 oz (51 kg)] 112 lb 7 oz (51 kg) (11/06 0350)    Intake/Output from previous day: 11/05 0701 - 11/06 0700 In: 1666.5 [P.O.:720; Blood:946.5] Out: 450 [Urine:450] Intake/Output this shift: Total I/O In: 910.8 [P.O.:600; Blood:310.8] Out: 500 [Urine:500]  PE: Gen:  Alert, NAD, pleasant Card:  RRR, no murmur heard Pulm:  CTA, no wheezing Abd: Soft, NT/ND, +BS Ext:  No erythema, edema, or tenderness  Lab Results:   Basename 12/03/11 1323 12/03/11 0649 12/03/11 0250  WBC 6.7 -- 9.4  HGB 10.4* 9.2* --  HCT 30.5* 27.6* --  PLT 140* -- 139*   BMET  Basename 12/03/11 0250 12/02/11 0405  NA 134* 137  K 4.0 4.4  CL 96 99  CO2 28 28  GLUCOSE 85 130*  BUN 47* 45*  CREATININE 1.43* 1.46*  CALCIUM 8.7 9.0   PT/INR  Basename 12/03/11 0649 12/03/11 0250  LABPROT 17.5* 19.5*  INR 1.48 1.71*   CMP     Component Value Date/Time   NA 134* 12/03/2011 0250   K 4.0 12/03/2011 0250   CL 96 12/03/2011 0250   CO2 28 12/03/2011 0250   GLUCOSE 85 12/03/2011 0250   BUN 47* 12/03/2011 0250   CREATININE 1.43* 12/03/2011 0250   CALCIUM 8.7 12/03/2011 0250   PROT 6.6 12/01/2011 1728   ALBUMIN 3.3* 12/01/2011 1728   AST 25 12/01/2011 1728   ALT 17 12/01/2011 1728   ALKPHOS 54 12/01/2011 1728   BILITOT 1.6* 12/01/2011 1728   GFRNONAA 29* 12/03/2011 0250   GFRAA 34* 12/03/2011 0250   Lipase  No results  found for this basename: lipase       Studies/Results: X-ray Chest Pa And Lateral  12/01/2011  *RADIOLOGY REPORT*  Clinical Data: 78 year old female with cough and hemoptysis.  CHEST - 2 VIEW  Comparison: 06/21/2010 and earlier.  Findings: Stable left chest cardiac pacemaker.  Lower lung volumes. Crowding of markings at both lung bases, some patchy opacity.  No definite consolidation or effusion.  No pneumothorax or edema. Stable cardiac size and mediastinal contours.  Osteopenia. Multilevel spinal compression deformities, no definite acute osseous abnormality.  IMPRESSION: Low lung volumes with patchy bibasilar opacity which most resembles atelectasis.   Original Report Authenticated By: Erskine Speed, M.D.    Ct Abdomen Pelvis W Contrast  12/02/2011  *RADIOLOGY REPORT*  Clinical Data: Lower abdominal pain since last night, evaluate for GI bleed or hematoma  CT ABDOMEN AND PELVIS WITH CONTRAST  Technique:  Multidetector CT imaging of the abdomen and pelvis was performed following the standard protocol during bolus administration of intravenous contrast.  Contrast: 75mL OMNIPAQUE IOHEXOL 300 MG/ML  SOLN  Comparison: None.  Findings:  There is a moderate amount of high-density fluid compatible with blood within the pelvic cul-de-sac extending along the bilateral pericolic gutters to extend to involve the inferior  aspect of the liver and spleen.   There is a very subtle contour abnormality within the peripheral aspect of the spleen (image 16.  No definitive evidence of contrast extravasation at this location.  There is a short segment loop of small bowel within the midline of the lower pelvis (axial images 56 through 58) which demonstrates wall thickening.  There is minimal similar inflammatory change with an additional short segment of small bowel within the left hemi pelvis (image 56, series 2).  Neither of these loops of bowel result in enteric obstruction.  No pneumoperitoneum, pneumatosis or portal  venous gas.  Extensive atherosclerotic calcifications of a normal caliber abdominal aorta.  The proximal aspects of the major branch vessels of the abdominal aorta, including the IMA, appear patent on this non CTA examination.  Normal appearance of the retrocecal appendix.  Normal hepatic contour.  Note is made of an approximately 1.2 cm hypoattenuating (11 HU) cyst within the anterior segment right lobe of the liver (image 17, series 2).  There is an additional approximately 1.5 cm cyst within the posterior segment right lobe of the liver (image 13).  Additional smaller sub centimeter hypoattenuating hepatic lesions are too small to accurately characterize the favored to represent additional hepatic cysts. Normal appearance of the gallbladder.  No definite intra or extrahepatic biliary ductal dilatation.  There is a serpiginous hypoattenuating structure regional to the pancreatic head (coronal images 45 through 48), measuring approximately 1.1 x 2.2 cm in greatest oblique axial dimension (axial image 26, series 2).  This structure is without associated pancreatic ductal dilatation.  There is age appropriate pancreatic atrophy.  Scattered calcifications within the spleen, sequela of prior granulomas infection.  There is symmetric enhancement and excretion of the bilateral kidneys.  Incidental note is made of a right-sided parapelvic cyst. Additional bilateral hypoattenuating lesions are too small to accurately characterize the favored to represent additional renal cysts.  No urinary obstruction. No definite renal stones.  No urinary obstruction or perinephric stranding.  Normal appearance of the bilateral adrenal glands.  Limited visualization lower thorax is negative for focal airspace opacity.  Cardiomegaly.  Pacer leads seen within the right atrium and ventricle.  Calcifications within the papillary musculature of the left ventricle.  No pericardial effusion.  Age indeterminate moderate (approximately 50%)  compression deformity of the L1 vertebral body.  No retropulsion of the posterior elements.  Moderate to severe multilevel lumbar spine degenerative change, worse at L4 - L5.  Post pinning of the left femoral neck.  IMPRESSION:  1.  Mild to moderate amounts of hemoperitoneum, the etiology of which is indeterminate on this examination.  Differential considerations include possible splenic laceration as there is a subtle contour abnormality within the peripheral aspect of the spleen (however this is without definitive contrast extravasation). Another etiology are two short segment abnormal loops of small bowel within the midline and left lateral aspect of the pelvis. Given the geographic involvement of the loops of small bowel as well as extensive atherosclerotic disease within the abdominal aorta, mesenteric ischemia is a possible differential possibility.  2.  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  This finding is without associated pancreatic ductal dilatation or peripancreatic stranding.  Comparison to prior examinations (if available) is recommended.  If no comparisons exist, further evaluation with non emergent pancreatic protocol MRI may be performed as clinically indicated. 3.  Age indeterminate moderate (approximately 50%) compression deformity of the L1 vertebral body.  Above findings  discussed with Mahala Menghini, MD at 72.   Original Report Authenticated By: Tacey Ruiz, MD     Anti-infectives: Anti-infectives     Start     Dose/Rate Route Frequency Ordered Stop   12/03/11 1200   vancomycin (VANCOCIN) 2,000 mg in sodium chloride 0.9 % 250 mL IVPB  Status:  Discontinued        2,000 mg 125 mL/hr over 120 Minutes Intravenous Every M-W-F (Hemodialysis) 12/03/11 0311 12/03/11 1610           Assessment/Plan Hemoperitoneum and hemorrhagic shock due to over-anticoagulation from coumadin, now within normal range 1.  Clinically looking  good, abdomen grossly benign on exam other than mild distension 2.  Will continue to follow along with medicine 3.  Recommend care management consult given daughters concerns and patients expressed wishes  Daughter is very concerned about keeping the conversations with her on a social basis because she is very anxious about her current condition and not being able to do for herself.  The patient and her daughter have discussed the pro's and con's of treatment and other interventions given her age and not wanting to go to a nursing home/rehab facility.       LOS: 2 days    DORT, Kimika Streater PA-C, CCS 12/03/2011, 5:35 PM Pager: [phone]

## 2011-12-03 NOTE — Progress Notes (Signed)
Update: Called [facility] and spoke to Dr. Sandria Manly to ask for a PCCM consult. Will send doc over when available. Encouraged this NP to call cardiology which I have done but the fellow did not answer her page Mayford Knife, MD). Paged again now and waiting for answer. Labs to be drawn again shortly. HR cont to be sinus tach in the high 150-160s and BP holding in 80-90 range.  Also, addressed code status with pt and she wishes to be a FULL CODE. Have tried to call pt's daughter, no answer.  Maren Reamer, NP [facility]

## 2011-12-03 NOTE — Progress Notes (Signed)
Per surgeon, will cont to tx medically now unless abd exam changes. Will give one unit FFP and recheck coags. If less than 1.5, can start PRBC transfusion. Placed pt on serial H/Hs and INR rechecks. Will cont to follow.  Maren Reamer, NP [facility]

## 2011-12-04 DIAGNOSIS — R079 Chest pain, unspecified: Secondary | ICD-10-CM

## 2011-12-04 DIAGNOSIS — R109 Unspecified abdominal pain: Secondary | ICD-10-CM

## 2011-12-04 LAB — CBC
Hemoglobin: 10.3 g/dL — ABNORMAL LOW (ref 12.0–15.0)
MCHC: 34.1 g/dL (ref 30.0–36.0)
RDW: 15.8 % — ABNORMAL HIGH (ref 11.5–15.5)
WBC: 8.3 10*3/uL (ref 4.0–10.5)

## 2011-12-04 LAB — PROTIME-INR
INR: 1.69 — ABNORMAL HIGH (ref 0.00–1.49)
Prothrombin Time: 19.3 seconds — ABNORMAL HIGH (ref 11.6–15.2)

## 2011-12-04 LAB — PREPARE FRESH FROZEN PLASMA: Unit division: 0

## 2011-12-04 MED ORDER — ATORVASTATIN CALCIUM 10 MG PO TABS
10.0000 mg | ORAL_TABLET | Freq: Every day | ORAL | Status: DC
  Administered 2011-12-04 – 2011-12-08 (×4): 10 mg via ORAL
  Filled 2011-12-04 (×6): qty 1

## 2011-12-04 NOTE — Care Management Note (Signed)
    Page 1 of 1   12/04/2011     3:36:12 PM   CARE MANAGEMENT NOTE 12/04/2011  Patient:  Rachel Shah, Rachel Shah   Account Number:  [id number]  Date Initiated:  12/04/2011  Documentation initiated by:  Donn Pierini  Subjective/Objective Assessment:   Pt admitted with irrg. HB n/v  FTT     Action/Plan:   PTA pt was staying with daughter- pt lived in [place]- is moving up to live with daughter- PT/OT evals pending- NCM to follow for recommendations   Anticipated DC Date:  12/06/2011   Anticipated DC Plan:        DC Planning Services  CM consult      Choice offered to / List presented to:             Status of service:  In process, will continue to follow Medicare Important Message given?   (If response is "NO", the following Medicare IM given date fields will be blank) Date Medicare IM given:   Date Additional Medicare IM given:    Discharge Disposition:    Per UR Regulation:  Reviewed for med. necessity/level of care/duration of stay  If discussed at Long Length of Stay Meetings, dates discussed:    Comments:  12/04/11- 1500- Donn Pierini RN, BSN [phone] Referral received for home needs- pt is to live with daughter- moved here from [place]- PT/OT evals pending-will await recommendations-

## 2011-12-04 NOTE — Progress Notes (Signed)
Subjective: Patient without complaints Remains relatively hemodynamically stable Minimal abdominal pain  Objective: Vital signs in last 24 hours: Temp:  [97.5 F (36.4 C)-98.8 F (37.1 C)] 97.5 F (36.4 C) (11/07 0804) Pulse Rate:  [60-147] 60  (11/07 0804) Resp:  [12-24] 18  (11/07 0804) BP: (77-114)/(37-58) 85/37 mmHg (11/07 0804) SpO2:  [96 %-100 %] 96 % (11/07 0804) Weight:  [114 lb 13.8 oz (52.1 kg)] 114 lb 13.8 oz (52.1 kg) (11/07 0414)    Intake/Output from previous day: 11/06 0701 - 11/07 0700 In: 1150.8 [P.O.:840; Blood:310.8] Out: 1225 [Urine:1225] Intake/Output this shift: Total I/O In: -  Out: 300 [Urine:300]  Abdomen soft, almost non tender, minimally full Lungs clear  Lab Results:   [facility] 12/04/11 0004 12/03/11 1323  WBC 8.3 6.7  HGB 10.3* 10.4*  HCT 30.2* 30.5*  PLT 137* 140*   BMET  Basename 12/03/11 0250 12/02/11 0405  NA 134* 137  K 4.0 4.4  CL 96 99  CO2 28 28  GLUCOSE 85 130*  BUN 47* 45*  CREATININE 1.43* 1.46*  CALCIUM 8.7 9.0   PT/INR  Basename 12/04/11 0455 12/03/11 0649  LABPROT 19.3* 17.5*  INR 1.69* 1.48   ABG No results found for this basename: PHART:2,PCO2:2,PO2:2,HCO3:2 in the last 72 hours  Studies/Results: Ct Abdomen Pelvis W Contrast  12/02/2011  *RADIOLOGY REPORT*  Clinical Data: Lower abdominal pain since last night, evaluate for GI bleed or hematoma  CT ABDOMEN AND PELVIS WITH CONTRAST  Technique:  Multidetector CT imaging of the abdomen and pelvis was performed following the standard protocol during bolus administration of intravenous contrast.  Contrast: 75mL OMNIPAQUE IOHEXOL 300 MG/ML  SOLN  Comparison: None.  Findings:  There is a moderate amount of high-density fluid compatible with blood within the pelvic cul-de-sac extending along the bilateral pericolic gutters to extend to involve the inferior aspect of the liver and spleen.   There is a very subtle contour abnormality within the peripheral aspect of the  spleen (image 16.  No definitive evidence of contrast extravasation at this location.  There is a short segment loop of small bowel within the midline of the lower pelvis (axial images 56 through 58) which demonstrates wall thickening.  There is minimal similar inflammatory change with an additional short segment of small bowel within the left hemi pelvis (image 56, series 2).  Neither of these loops of bowel result in enteric obstruction.  No pneumoperitoneum, pneumatosis or portal venous gas.  Extensive atherosclerotic calcifications of a normal caliber abdominal aorta.  The proximal aspects of the major branch vessels of the abdominal aorta, including the IMA, appear patent on this non CTA examination.  Normal appearance of the retrocecal appendix.  Normal hepatic contour.  Note is made of an approximately 1.2 cm hypoattenuating (11 HU) cyst within the anterior segment right lobe of the liver (image 17, series 2).  There is an additional approximately 1.5 cm cyst within the posterior segment right lobe of the liver (image 13).  Additional smaller sub centimeter hypoattenuating hepatic lesions are too small to accurately characterize the favored to represent additional hepatic cysts. Normal appearance of the gallbladder.  No definite intra or extrahepatic biliary ductal dilatation.  There is a serpiginous hypoattenuating structure regional to the pancreatic head (coronal images 45 through 48), measuring approximately 1.1 x 2.2 cm in greatest oblique axial dimension (axial image 26, series 2).  This structure is without associated pancreatic ductal dilatation.  There is age appropriate pancreatic atrophy.  Scattered calcifications within  the spleen, sequela of prior granulomas infection.  There is symmetric enhancement and excretion of the bilateral kidneys.  Incidental note is made of a right-sided parapelvic cyst. Additional bilateral hypoattenuating lesions are too small to accurately characterize the favored to  represent additional renal cysts.  No urinary obstruction. No definite renal stones.  No urinary obstruction or perinephric stranding.  Normal appearance of the bilateral adrenal glands.  Limited visualization lower thorax is negative for focal airspace opacity.  Cardiomegaly.  Pacer leads seen within the right atrium and ventricle.  Calcifications within the papillary musculature of the left ventricle.  No pericardial effusion.  Age indeterminate moderate (approximately 50%) compression deformity of the L1 vertebral body.  No retropulsion of the posterior elements.  Moderate to severe multilevel lumbar spine degenerative change, worse at L4 - L5.  Post pinning of the left femoral neck.  IMPRESSION:  1.  Mild to moderate amounts of hemoperitoneum, the etiology of which is indeterminate on this examination.  Differential considerations include possible splenic laceration as there is a subtle contour abnormality within the peripheral aspect of the spleen (however this is without definitive contrast extravasation). Another etiology are two short segment abnormal loops of small bowel within the midline and left lateral aspect of the pelvis. Given the geographic involvement of the loops of small bowel as well as extensive atherosclerotic disease within the abdominal aorta, mesenteric ischemia is a possible differential possibility.  2.  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  This finding is without associated pancreatic ductal dilatation or peripancreatic stranding.  Comparison to prior examinations (if available) is recommended.  If no comparisons exist, further evaluation with non emergent pancreatic protocol MRI may be performed as clinically indicated. 3.  Age indeterminate moderate (approximately 50%) compression deformity of the L1 vertebral body.  Above findings discussed with Mahala Menghini, MD at 1921.   Original Report Authenticated By: Tacey Ruiz, MD      Anti-infectives: Anti-infectives     Start     Dose/Rate Route Frequency Ordered Stop   12/03/11 1200   vancomycin (VANCOCIN) 2,000 mg in sodium chloride 0.9 % 250 mL IVPB  Status:  Discontinued        2,000 mg 125 mL/hr over 120 Minutes Intravenous Every M-W-F (Hemodialysis) 12/03/11 0311 12/03/11 0312          Assessment/Plan: s/p * No surgery found *  Hemoperitoneum from anticoagulation. No plans for exploratory lap.  Will advance po. If fever develops, may need IR to place drains (but I suspect that won't be needed) Hgb/HCT stable   LOS: 3 days    Jezabelle Chisolm A 12/04/2011

## 2011-12-04 NOTE — Progress Notes (Signed)
Patient: Rachel Shah Date of Encounter: 12/04/2011, 8:25 AM Admit date: 12/01/2011     Subjective  [name] has no complaints this AM. She states she slept very well and her abdominal pain has resolved. She denies CP, SOB or palpitations.   Objective  Physical Exam: Vitals: BP 85/37  Pulse 60  Temp 97.5 F (36.4 C) (Oral)  Resp 18  Ht 5' (1.524 m)  Wt 114 lb 13.8 oz (52.1 kg)  BMI 22.43 kg/m2  SpO2 96% General: Well developed, well appearing, elderly 78 year old female in no acute distress. Neck: Supple. JVD not elevated. Lungs: Clear bilaterally to auscultation without wheezes, rales, or rhonchi. Breathing is unlabored. Heart: RRR S1 S2 without murmurs, rubs, or gallops.  Abdomen: Soft, mildly distended. Non-tender. Extremities: No clubbing or cyanosis. No edema.  Distal pedal pulses are 2+ and equal bilaterally. Neuro: Alert and oriented X 3. Moves all extremities spontaneously. No focal deficits.  Intake/Output:  Intake/Output Summary (Last 24 hours) at 12/04/11 0825 Last data filed at 12/04/11 0800  Gross per 24 hour  Intake 1150.83 ml  Output   1525 ml  Net -374.17 ml    Inpatient Medications:     . [COMPLETED] adenosine (ADENOCARD) IV  6 mg Intravenous Once  . diltiazem  30 mg Oral Q6H  . feeding supplement  237 mL Oral BID BM  . pantoprazole (PROTONIX) IV  40 mg Intravenous Q12H  . sodium chloride  3 mL Intravenous Q12H  . [DISCONTINUED] potassium chloride  20 mEq Oral Daily    Labs:  [facility] 12/03/11 0250 12/02/11 0405  NA 134* 137  K 4.0 4.4  CL 96 99  CO2 28 28  GLUCOSE 85 130*  BUN 47* 45*  CREATININE 1.43* 1.46*  CALCIUM 8.7 9.0  MG -- --  PHOS -- --    Basename 12/01/11 1728  AST 25  ALT 17  ALKPHOS 54  BILITOT 1.6*  PROT 6.6  ALBUMIN 3.3*    Basename 12/04/11 0004 12/03/11 1323 12/01/11 1728  WBC 8.3 6.7 --  NEUTROABS -- -- 8.4*  HGB 10.3* 10.4* --  HCT 30.2* 30.5* --  MCV 92.4 91.6 --  PLT 137* 140* --     Basename 12/03/11 0300 12/02/11 0405 12/01/11 2205 12/01/11 1728  CKTOTAL -- -- -- --  CKMB -- -- -- --  TROPONINI <0.30 <0.30 <0.30 <0.30    Radiology/Studies: X-ray Chest Pa And Lateral  12/01/2011  *RADIOLOGY REPORT*  Clinical Data: 78 year old female with cough and hemoptysis.  CHEST - 2 VIEW  Comparison: 06/21/2010 and earlier.  Findings: Stable left chest cardiac pacemaker.  Lower lung volumes. Crowding of markings at both lung bases, some patchy opacity.  No definite consolidation or effusion.  No pneumothorax or edema. Stable cardiac size and mediastinal contours.  Osteopenia. Multilevel spinal compression deformities, no definite acute osseous abnormality.  IMPRESSION: Low lung volumes with patchy bibasilar opacity which most resembles atelectasis.   Original Report Authenticated By: Erskine Speed, M.D.    Ct Abdomen Pelvis W Contrast  12/02/2011  *RADIOLOGY REPORT*  Clinical Data: Lower abdominal pain since last night, evaluate for GI bleed or hematoma  CT ABDOMEN AND PELVIS WITH CONTRAST  Technique:  Multidetector CT imaging of the abdomen and pelvis was performed following the standard protocol during bolus administration of intravenous contrast.  Contrast: 75mL OMNIPAQUE IOHEXOL 300 MG/ML  SOLN  Comparison: None.  Findings:  There is a moderate amount of high-density fluid compatible with blood  within the pelvic cul-de-sac extending along the bilateral pericolic gutters to extend to involve the inferior aspect of the liver and spleen.   There is a very subtle contour abnormality within the peripheral aspect of the spleen (image 16.  No definitive evidence of contrast extravasation at this location.  There is a short segment loop of small bowel within the midline of the lower pelvis (axial images 56 through 58) which demonstrates wall thickening.  There is minimal similar inflammatory change with an additional short segment of small bowel within the left hemi pelvis (image 56, series 2).   Neither of these loops of bowel result in enteric obstruction.  No pneumoperitoneum, pneumatosis or portal venous gas.  Extensive atherosclerotic calcifications of a normal caliber abdominal aorta.  The proximal aspects of the major branch vessels of the abdominal aorta, including the IMA, appear patent on this non CTA examination.  Normal appearance of the retrocecal appendix.  Normal hepatic contour.  Note is made of an approximately 1.2 cm hypoattenuating (11 HU) cyst within the anterior segment right lobe of the liver (image 17, series 2).  There is an additional approximately 1.5 cm cyst within the posterior segment right lobe of the liver (image 13).  Additional smaller sub centimeter hypoattenuating hepatic lesions are too small to accurately characterize the favored to represent additional hepatic cysts. Normal appearance of the gallbladder.  No definite intra or extrahepatic biliary ductal dilatation.  There is a serpiginous hypoattenuating structure regional to the pancreatic head (coronal images 45 through 48), measuring approximately 1.1 x 2.2 cm in greatest oblique axial dimension (axial image 26, series 2).  This structure is without associated pancreatic ductal dilatation.  There is age appropriate pancreatic atrophy.  Scattered calcifications within the spleen, sequela of prior granulomas infection.  There is symmetric enhancement and excretion of the bilateral kidneys.  Incidental note is made of a right-sided parapelvic cyst. Additional bilateral hypoattenuating lesions are too small to accurately characterize the favored to represent additional renal cysts.  No urinary obstruction. No definite renal stones.  No urinary obstruction or perinephric stranding.  Normal appearance of the bilateral adrenal glands.  Limited visualization lower thorax is negative for focal airspace opacity.  Cardiomegaly.  Pacer leads seen within the right atrium and ventricle.  Calcifications within the papillary  musculature of the left ventricle.  No pericardial effusion.  Age indeterminate moderate (approximately 50%) compression deformity of the L1 vertebral body.  No retropulsion of the posterior elements.  Moderate to severe multilevel lumbar spine degenerative change, worse at L4 - L5.  Post pinning of the left femoral neck.  IMPRESSION:  1.  Mild to moderate amounts of hemoperitoneum, the etiology of which is indeterminate on this examination.  Differential considerations include possible splenic laceration as there is a subtle contour abnormality within the peripheral aspect of the spleen (however this is without definitive contrast extravasation). Another etiology are two short segment abnormal loops of small bowel within the midline and left lateral aspect of the pelvis. Given the geographic involvement of the loops of small bowel as well as extensive atherosclerotic disease within the abdominal aorta, mesenteric ischemia is a possible differential possibility.  2.  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  This finding is without associated pancreatic ductal dilatation or peripancreatic stranding.  Comparison to prior examinations (if available) is recommended.  If no comparisons exist, further evaluation with non emergent pancreatic protocol MRI may be performed as clinically indicated. 3.  Age indeterminate moderate (approximately 50%) compression deformity of the L1 vertebral body.  Above findings discussed with Mahala Menghini, MD at 1921.   Original Report Authenticated By: Tacey Ruiz, MD     Telemetry: A paced V sensed at 60 bpm   Assessment and Plan  1. Hemoperitoneum in setting of supratherapeutic INR - stabilizing; no evidence of shock; per surgery, recommendation for non-operative management 2. Acute blood loss anemia - Hgb/Hct stable; given FFP and vitamin K for reversal of supratherapeutic INR 3. PSVT - adenosine sensitive; no recurrence  since yesterday AM 4. PAFib and tachy-brady syndrome, PPM in place 5. Acute renal insufficiency 6. History of both diastolic and systolic HF but normal LV function by technically limited echo 12/02/2011  Dr. Graciela Husbands to see and make further recommendations Signed, EDMISTEN, BROOKE PA-C  Patient seen and evaluated separately but concur with the findings listed above. We'll begin to ambulate with hopes of discharge in the next few days. We will get OT and PT to see her today

## 2011-12-04 NOTE — Progress Notes (Signed)
See pa cosigned note

## 2011-12-04 NOTE — Progress Notes (Signed)
[facility] PROGRESS NOTE  Rachel Shah NFA:[id number] DOB: 1910-07-24 DOA: 12/01/2011 PCP: Eustaquio Boyden, MD  Assessment/Plan: 1. Hemorrhagic shock due to hemoperitoneum in the setting of overanticoagulation due to coumadin s/p 1 units of PRBCS and 2 units of FFP. INR corrected by 12/03/11. Bleeding stopped by November 6. Hemoglobin has remained stable at around 10.3  Shock resolved by November 7 2. Acute blood loss anemia - Hb dropped from 13.2 to 9.2 . Post transfusion hb is up to 10.4 on 12/03/11  3. Tachybrady syndrome - s/p pacemaker no further plans for anticoagulation 4. HTN: - Holding all blood pressure medications due to shock 5. HL: - Resumed Lipitor on November 7 6. SVT - resolved with Adenosine iv 12/03/11 Code Status: Limited  Family Communication: daughter  Disposition Plan: ?   Consultants:  PCCM  Cardiology   CCS  Procedures:  CT  HPI/Subjective: Hungry would like food  Objective: Filed Vitals:   12/03/11 1733 12/03/11 2006 12/04/11 0008 12/04/11 0414  BP: 114/43 105/48 104/42 93/48  Pulse:  69 62 62  Temp:  98.4 F (36.9 C) 98.8 F (37.1 C) 98.4 F (36.9 C)  TempSrc:  Oral Oral Oral  Resp:  14  22  Height:      Weight:    52.1 kg (114 lb 13.8 oz)  SpO2:  99% 100% 98%    Intake/Output Summary (Last 24 hours) at 12/04/11 0803 Last data filed at 12/04/11 0700  Gross per 24 hour  Intake 1150.83 ml  Output   1225 ml  Net -74.17 ml   Filed Weights   12/02/11 0643 12/03/11 0350 12/04/11 0414  Weight: 49.2 kg (108 lb 7.5 oz) 51 kg (112 lb 7 oz) 52.1 kg (114 lb 13.8 oz)    Exam:   General:  axox3  Cardiovascular: RRR  Respiratory: CTAB  Abdomen: soft, minimal tenderness. Quiet   Data Reviewed: Basic Metabolic Panel:  Lab 12/03/11 4696 12/02/11 0405 12/01/11 1728  NA 134* 137 135  K 4.0 4.4 4.0  CL 96 99 97  CO2 28 28 26   GLUCOSE 85 130* 107*  BUN 47* 45* 45*  CREATININE 1.43* 1.46* 1.40*  CALCIUM 8.7 9.0 8.9  MG --  -- --  PHOS -- -- --   Liver Function Tests:  Lab 12/01/11 1728  AST 25  ALT 17  ALKPHOS 54  BILITOT 1.6*  PROT 6.6  ALBUMIN 3.3*   No results found for this basename: LIPASE:5,AMYLASE:5 in the last 168 hours No results found for this basename: AMMONIA:5 in the last 168 hours CBC:  Lab 12/04/11 0004 12/03/11 1323 12/03/11 0649 12/03/11 0250 12/01/11 1728  WBC 8.3 6.7 -- 9.4 10.1  NEUTROABS -- -- -- -- 8.4*  HGB 10.3* 10.4* 9.2* 9.6* 13.2  HCT 30.2* 30.5* 27.6* 28.9* 40.0  MCV 92.4 91.6 -- 92.3 93.7  PLT 137* 140* -- 139* 171   Cardiac Enzymes:  Lab 12/03/11 0300 12/02/11 0405 12/01/11 2205 12/01/11 1728  CKTOTAL -- -- -- --  CKMB -- -- -- --  CKMBINDEX -- -- -- --  TROPONINI <0.30 <0.30 <0.30 <0.30   BNP (last 3 results)  Basename 12/01/11 1728  PROBNP 867.7*   CBG: No results found for this basename: GLUCAP:5 in the last 168 hours  Recent Results (from the past 240 hour(s))  MRSA PCR SCREENING     Status: Normal   Collection Time   12/03/11  3:55 AM      Component Value Range Status Comment  MRSA by PCR NEGATIVE  NEGATIVE Final      Studies: Ct Abdomen Pelvis W Contrast  12/02/2011  *RADIOLOGY REPORT*  Clinical Data: Lower abdominal pain since last night, evaluate for GI bleed or hematoma  CT ABDOMEN AND PELVIS WITH CONTRAST  Technique:  Multidetector CT imaging of the abdomen and pelvis was performed following the standard protocol during bolus administration of intravenous contrast.  Contrast: 75mL OMNIPAQUE IOHEXOL 300 MG/ML  SOLN  Comparison: None.  Findings:  There is a moderate amount of high-density fluid compatible with blood within the pelvic cul-de-sac extending along the bilateral pericolic gutters to extend to involve the inferior aspect of the liver and spleen.   There is a very subtle contour abnormality within the peripheral aspect of the spleen (image 16.  No definitive evidence of contrast extravasation at this location.  There is a short segment  loop of small bowel within the midline of the lower pelvis (axial images 56 through 58) which demonstrates wall thickening.  There is minimal similar inflammatory change with an additional short segment of small bowel within the left hemi pelvis (image 56, series 2).  Neither of these loops of bowel result in enteric obstruction.  No pneumoperitoneum, pneumatosis or portal venous gas.  Extensive atherosclerotic calcifications of a normal caliber abdominal aorta.  The proximal aspects of the major branch vessels of the abdominal aorta, including the IMA, appear patent on this non CTA examination.  Normal appearance of the retrocecal appendix.  Normal hepatic contour.  Note is made of an approximately 1.2 cm hypoattenuating (11 HU) cyst within the anterior segment right lobe of the liver (image 17, series 2).  There is an additional approximately 1.5 cm cyst within the posterior segment right lobe of the liver (image 13).  Additional smaller sub centimeter hypoattenuating hepatic lesions are too small to accurately characterize the favored to represent additional hepatic cysts. Normal appearance of the gallbladder.  No definite intra or extrahepatic biliary ductal dilatation.  There is a serpiginous hypoattenuating structure regional to the pancreatic head (coronal images 45 through 48), measuring approximately 1.1 x 2.2 cm in greatest oblique axial dimension (axial image 26, series 2).  This structure is without associated pancreatic ductal dilatation.  There is age appropriate pancreatic atrophy.  Scattered calcifications within the spleen, sequela of prior granulomas infection.  There is symmetric enhancement and excretion of the bilateral kidneys.  Incidental note is made of a right-sided parapelvic cyst. Additional bilateral hypoattenuating lesions are too small to accurately characterize the favored to represent additional renal cysts.  No urinary obstruction. No definite renal stones.  No urinary obstruction or  perinephric stranding.  Normal appearance of the bilateral adrenal glands.  Limited visualization lower thorax is negative for focal airspace opacity.  Cardiomegaly.  Pacer leads seen within the right atrium and ventricle.  Calcifications within the papillary musculature of the left ventricle.  No pericardial effusion.  Age indeterminate moderate (approximately 50%) compression deformity of the L1 vertebral body.  No retropulsion of the posterior elements.  Moderate to severe multilevel lumbar spine degenerative change, worse at L4 - L5.  Post pinning of the left femoral neck.  IMPRESSION:  1.  Mild to moderate amounts of hemoperitoneum, the etiology of which is indeterminate on this examination.  Differential considerations include possible splenic laceration as there is a subtle contour abnormality within the peripheral aspect of the spleen (however this is without definitive contrast extravasation). Another etiology are two short segment abnormal loops of small bowel within  the midline and left lateral aspect of the pelvis. Given the geographic involvement of the loops of small bowel as well as extensive atherosclerotic disease within the abdominal aorta, mesenteric ischemia is a possible differential possibility.  2.  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  This finding is without associated pancreatic ductal dilatation or peripancreatic stranding.  Comparison to prior examinations (if available) is recommended.  If no comparisons exist, further evaluation with non emergent pancreatic protocol MRI may be performed as clinically indicated. 3.  Age indeterminate moderate (approximately 50%) compression deformity of the L1 vertebral body.  Above findings discussed with Mahala Menghini, MD at 1921.   Original Report Authenticated By: Tacey Ruiz, MD     Scheduled Meds:    . [COMPLETED] adenosine (ADENOCARD) IV  6 mg Intravenous Once  . diltiazem  30 mg  Oral Q6H  . feeding supplement  237 mL Oral BID BM  . pantoprazole (PROTONIX) IV  40 mg Intravenous Q12H  . sodium chloride  3 mL Intravenous Q12H  . [DISCONTINUED] metoprolol  12.5 mg Oral BID  . [DISCONTINUED] potassium chloride  20 mEq Oral Daily   Continuous Infusions:   Principal Problem:  *Hemorrhagic shock Active Problems:  Tachy-brady syndrome  Atrial fibrillation  Nocturnal dyspnea/choking  Pacemaker-MDT  DVT (deep venous thrombosis)  Chronic systolic heart failure  Failure to thrive  Abdominal pain  SVT (supraventricular tachycardia)  Anemia due to blood loss, acute  Warfarin-induced coagulopathy      Rachel Shah  [facility] Pager [phone]. If 8PM-8AM, please contact night-coverage at [email or web address], password [facility] 12/04/2011, 8:03 AM  LOS: 3 days

## 2011-12-04 NOTE — Progress Notes (Signed)
The patient is stabilizing.  There is no evidence of peritonitis, acute abdomen, nor shock.  There is no strong evidence of failure of improvement nor decline with current non-operative management.  There is no need for surgery at the present moment.  Avoid operation for a problem w coagulopathy.  We will continue to follow.

## 2011-12-04 NOTE — Progress Notes (Signed)
Report called to Tiffany, RN, for transfer.  Patient without complaints, transferred to [id number], daughter at bedside.

## 2011-12-05 ENCOUNTER — Encounter: Payer: Medicare Other | Admitting: Internal Medicine

## 2011-12-05 LAB — TYPE AND SCREEN
ABO/RH(D): A POS
Antibody Screen: NEGATIVE
Unit division: 0
Unit division: 0

## 2011-12-05 LAB — CBC
MCH: 30.7 pg (ref 26.0–34.0)
MCV: 91.3 fL (ref 78.0–100.0)
Platelets: 163 10*3/uL (ref 150–400)
RDW: 14.9 % (ref 11.5–15.5)

## 2011-12-05 LAB — BASIC METABOLIC PANEL
Calcium: 8.8 mg/dL (ref 8.4–10.5)
Creatinine, Ser: 1.11 mg/dL — ABNORMAL HIGH (ref 0.50–1.10)
GFR calc Af Amer: 46 mL/min — ABNORMAL LOW (ref >90)
GFR calc non Af Amer: 40 mL/min — ABNORMAL LOW (ref >90)

## 2011-12-05 NOTE — Progress Notes (Signed)
Rachel Shah [id number] 1910/12/12   Subjective:  Family in room.  Feels much better.  Tolerating solid food.  Had a bowel movement.  Sitting up in chair.  Objective:  Vital signs:  Filed Vitals:   12/04/11 1702 12/04/11 2031 12/05/11 0546 12/05/11 0608  BP: 129/59 133/60 99/65   Pulse: 67 65 64   Temp: 97.5 F (36.4 C) 97.5 F (36.4 C) 98.5 F (36.9 C)   TempSrc: Oral Oral Oral   Resp: 18 18 18    Height: 5' (1.524 m)     Weight: 113 lb 10.7 oz (51.56 kg)   114 lb 3.2 oz (51.8 kg)  SpO2: 97% 100% 95%     Last BM Date: 12/05/11 (patient can't recall)  Intake/Output   Yesterday:  11/07 0701 - 11/08 0700 In: 1043 [P.O.:1040; I.V.:3] Out: 1550 [Urine:1550] This shift:  Total I/O In: 480 [P.O.:480] Out: 200 [Urine:200]  Bowel function:  Flatus: y  BM: y  Physical Exam:  General: Pt awake/alert/oriented x4 in no acute distress Eyes: PERRL, normal EOM.  Sclera clear.  No icterus Neuro: CN II-XII intact w/o focal sensory/motor deficits. Lymph: No head/neck/groin lymphadenopathy Psych:  No delerium/psychosis/paranoia HENT: Normocephalic, Mucus membranes moist.  No thrush. Very HOH unchanged Neck: Supple, No tracheal deviation Chest: No chest wall pain w good excursion CV:  Pulses intact.  Regular rhythm MS: Normal AROM mjr joints.  No obvious deformity Abdomen: Soft.  Mildly distended at the most.  Nontender.  No incarcerated hernias. Ext:  SCDs BLE.  No mjr edema.  No cyanosis Skin: No petechiae / purpurae  Problem List:  Principal Problem:  *Hemorrhagic shock Active Problems:  Tachy-brady syndrome  Atrial fibrillation  Nocturnal dyspnea/choking  Pacemaker-MDT  DVT (deep venous thrombosis)  Acute on chronic diastolic heart failure  Failure to thrive  Abdominal pain  SVT (supraventricular tachycardia)  Anemia due to blood loss, acute  Warfarin-induced coagulopathy   Assessment  Rachel Shah  78 y.o. female       Hemoperitoneum  secondary to over coagulation.  Stabilized.  Improved.    Plan:  No need for surgery at this time.  Hesitate to have her be re\re anticoagulated but will have her discussed the positives and negative risks.  Bowel regimen.  We will sign off.  Please call with uestions.  -VTE prophylaxis- SCDs, etc -mobilize as tolerated to help recovery  Ardeth Sportsman, M.D., F.A.C.S. Gastrointestinal and Minimally Invasive Surgery [facility] [address], [address] [place], [place] [address] [phone] Main / Paging [phone] Voice Mail   12/05/2011  CARE TEAM:  PCP: Eustaquio Boyden, MD  Outpatient Care Team: Patient Care Team: Eustaquio Boyden, MD as PCP - General (Family Medicine)  Inpatient Treatment Team: Treatment Team: Attending Provider: Lorane Gell, MD; Student Nurse: Seward Speck, Student-RN; Technician: Loyce Dys, NT; Rounding Team: Md Montez Morita, MD; Rounding Team: Doran Clay, MD; Technician: Tana Felts, NT; Dietitian: Hettie Holstein, RD; Respiratory Therapist: Audelia Hives, RRT; Registered Nurse: Namon Cirri, RN; Physical Therapist: Cristine Polio, PT; Physical Therapist: Army Chaco, Student-PT; Occupational Therapist: Grant Ruts, OT; Registered Nurse: Charlynn Grimes, RN   Results:   Labs: Results for orders placed during the hospital encounter of 12/01/11 (from the past 48 hour(s))  CBC     Status: Abnormal   Collection Time   12/03/11  1:23 PM      Component Value Range Comment   WBC 6.7  4.0 - 10.5 K/uL    RBC 3.33 (*) 3.87 - 5.11 MIL/uL    Hemoglobin 10.4 (*) 12.0 - 15.0 g/dL    HCT 16.1 (*) 09.6 - 46.0 %    MCV 91.6  78.0 - 100.0 fL    MCH 31.2  26.0 - 34.0 pg    MCHC 34.1  30.0 - 36.0 g/dL    RDW 04.5 (*) 40.9 - 15.5 %    Platelets 140 (*) 150 - 400 K/uL   CBC     Status: Abnormal   Collection Time   12/04/11 12:04 AM      Component Value Range Comment   WBC  8.3  4.0 - 10.5 K/uL    RBC 3.27 (*) 3.87 - 5.11 MIL/uL    Hemoglobin 10.3 (*) 12.0 - 15.0 g/dL    HCT 81.1 (*) 91.4 - 46.0 %    MCV 92.4  78.0 - 100.0 fL    MCH 31.5  26.0 - 34.0 pg    MCHC 34.1  30.0 - 36.0 g/dL    RDW 78.2 (*) 95.6 - 15.5 %    Platelets 137 (*) 150 - 400 K/uL   PROTIME-INR     Status: Abnormal   Collection Time   12/04/11  4:55 AM      Component Value Range Comment   Prothrombin Time 19.3 (*) 11.6 - 15.2 seconds    INR 1.69 (*) 0.00 - 1.49   PROTIME-INR     Status: Abnormal   Collection Time   12/05/11  6:45 AM      Component Value Range Comment   Prothrombin Time 19.4 (*) 11.6 - 15.2 seconds    INR 1.70 (*) 0.00 - 1.49   CBC     Status: Abnormal   Collection Time   12/05/11  6:45 AM      Component Value Range Comment   WBC 7.5  4.0 - 10.5 K/uL    RBC 3.58 (*) 3.87 - 5.11 MIL/uL    Hemoglobin 11.0 (*) 12.0 - 15.0 g/dL    HCT 21.3 (*) 08.6 - 46.0 %    MCV 91.3  78.0 - 100.0 fL    MCH 30.7  26.0 - 34.0 pg    MCHC 33.6  30.0 - 36.0 g/dL    RDW 57.8  46.9 - 62.9 %    Platelets 163  150 - 400 K/uL   BASIC METABOLIC PANEL     Status: Abnormal   Collection Time   12/05/11  6:45 AM      Component Value Range Comment   Sodium 136  135 - 145 mEq/L    Potassium 4.0  3.5 - 5.1 mEq/L    Chloride 99  96 - 112 mEq/L    CO2 27  19 - 32 mEq/L    Glucose, Bld 91  70 - 99 mg/dL    BUN 32 (*) 6 - 23 mg/dL    Creatinine, Ser 5.28 (*) 0.50 - 1.10 mg/dL    Calcium 8.8  8.4 - 41.3 mg/dL    GFR calc non Af Amer 40 (*) >90 mL/min    GFR calc Af Amer 46 (*) >90 mL/min     Imaging / Studies: No results found.  Medications / Allergies: per chart  Antibiotics: Anti-infectives     Start     Dose/Rate Route Frequency Ordered Stop   12/03/11 1200   vancomycin (VANCOCIN) 2,000 mg in sodium chloride 0.9 % 250 mL IVPB  Status:  Discontinued        2,000 mg 125 mL/hr over 120 Minutes Intravenous Every M-W-F (Hemodialysis) 12/03/11 0981 12/03/11 1914

## 2011-12-05 NOTE — Evaluation (Signed)
Physical Therapy Evaluation Patient Details Name: Rachel Shah MRN: [id number] DOB: [birth year] Today's Date: 12/05/2011 Time: 6578-4696 PT Time Calculation (min): 20 min  PT Assessment / Plan / Recommendation Clinical Impression  Pt admitted with FTT, dyspnea and hemoperitoneum and progressing well. Pt will benefit from acute therapy to maximize transfers, balance and gait prior to discharge to decrease burden of care. Recommend daily mobility with nursing staff.     PT Assessment  Patient needs continued PT services    Follow Up Recommendations  Home health PT    Does the patient have the potential to tolerate intense rehabilitation      Barriers to Discharge None      Equipment Recommendations  Rolling walker with 5" wheels    Recommendations for Other Services     Frequency Min 3X/week    Precautions / Restrictions Precautions Precautions: Fall   Pertinent Vitals/Pain No pain      Mobility  Bed Mobility Bed Mobility: Supine to Sit;Sit to Supine Supine to Sit: 4: Min assist;HOB flat Sit to Supine: 4: Min guard;HOB flat Details for Bed Mobility Assistance: cueing to initiate Transfers Transfers: Sit to Stand;Stand to Sit Sit to Stand: 4: Min assist;From bed Stand to Sit: 4: Min assist;To bed Details for Transfer Assistance: assist for balance and anterior translation Ambulation/Gait Ambulation/Gait Assistance: 4: Min assist Ambulation Distance (Feet): 90 Feet Assistive device: 2 person hand held assist Ambulation/Gait Assistance Details: Pt states she only started using a cane over the last week but generally doesn't use anything to ambulate.  Gait Pattern: Step-through pattern;Decreased stride length;Trunk flexed;Narrow base of support Gait velocity: decreased Stairs: No    Shoulder Instructions     Exercises     PT Diagnosis: Difficulty walking  PT Problem List: Decreased activity tolerance;Decreased balance;Decreased mobility;Decreased knowledge  of use of DME PT Treatment Interventions: Gait training;DME instruction;Stair training;Functional mobility training;Therapeutic activities;Therapeutic exercise;Patient/family education   PT Goals Acute Rehab PT Goals PT Goal Formulation: With patient Time For Goal Achievement: 12/12/11 Potential to Achieve Goals: Good Pt will go Supine/Side to Sit: with modified independence;with HOB 0 degrees PT Goal: Supine/Side to Sit - Progress: Goal set today Pt will go Sit to Supine/Side: with modified independence;with HOB 0 degrees PT Goal: Sit to Supine/Side - Progress: Goal set today Pt will go Sit to Stand: with supervision PT Goal: Sit to Stand - Progress: Goal set today Pt will go Stand to Sit: with supervision PT Goal: Stand to Sit - Progress: Goal set today Pt will Ambulate: 51 - 150 feet;with supervision;with least restrictive assistive device PT Goal: Ambulate - Progress: Goal set today Pt will Go Up / Down Stairs: 3-5 stairs;with min assist;with least restrictive assistive device PT Goal: Up/Down Stairs - Progress: Goal set today  Visit Information  Last PT Received On: 12/05/11 Assistance Needed: +1    Subjective Data  Subjective: "I was driving last week" Patient Stated Goal: non-stated was agreeable to moving with therapy   Prior Functioning  Home Living Lives With: Daughter (dgtr is 77yo) Available Help at Discharge: Family;Available 24 hours/day Type of Home: House Home Access: Stairs to enter [employer] of Steps: 4 Home Layout: Two level;Able to live on main level with bedroom/bathroom;1/2 bath on main level Bathroom Toilet: Standard Home Adaptive Equipment: Straight cane Prior Function Level of Independence: Independent Able to Take Stairs?: Yes Driving: Yes Vocation: Retired Musician: Clinical cytogeneticist  Overall Cognitive Status: Impaired Area of Impairment: Safety/judgement Arousal/Alertness: Awake/alert Orientation Level:  Appears intact for tasks assessed Behavior During Session: [facility] for tasks performed Safety/Judgement: Decreased awareness of need for assistance;Decreased safety judgement for tasks assessed Cognition - Other Comments: pt very much a talker and can be difficult to redirect given [facility] as well    Extremity/Trunk Assessment Right Lower Extremity Assessment RLE ROM/Strength/Tone: [facility] for tasks assessed Left Lower Extremity Assessment LLE ROM/Strength/Tone: [facility] for tasks assessed Trunk Assessment Trunk Assessment: Kyphotic   Balance    End of Session PT - End of Session Equipment Utilized During Treatment: Gait belt Activity Tolerance: Patient tolerated treatment well Patient left: in bed;with call bell/phone within reach;with bed alarm set Nurse Communication: Mobility status  GP     Toney Sang Beth 12/05/2011, 2:32 PM  Delaney Meigs, PT [phone]

## 2011-12-05 NOTE — Progress Notes (Signed)
Patient Name: Rachel Shah      SUBJECTIVE: Feeling better. The shortness of breath the lumen of abdominal discomfort  Past Medical History  Diagnosis Date  . Atrial flutter   . Supraventricular tachycardia   . Tachy-brady syndrome     post [employer] revealed pulse generator dual lead pacemaker  . CHF (congestive heart failure)     a. hx of diastolic CHF;  b. recent dx with systolic CHF  . Angina at rest   . Transient ischemic attack 2007    Remote history of  . Fibroids     History of uterine fibroids  . Thrombocytopenia     Mild  . Abnormal thyroid function test     Mildly abnormal thyroid function studies with a TSH of 7.984 (upper limit of normal 4.5), free T4 within normal limits at 1.21 and T3  slightly low at 69.2 (lower limit of normal 80), follow up with primary care  . Hypertension   . High cholesterol   . Pacemaker   . Shortness of breath   . History of blood transfusion 12/02/2011  . Lower GI bleeding     "dr told me last year that I might ought to have OR but not at my age" (12/02/2011)  . Hard of hearing     bilaterally "(12/02/2011)    PHYSICAL EXAM Filed Vitals:   12/04/11 1702 12/04/11 2031 12/05/11 0546 12/05/11 0608  BP: 129/59 133/60 99/65   Pulse: 67 65 64   Temp: 97.5 F (36.4 C) 97.5 F (36.4 C) 98.5 F (36.9 C)   TempSrc: Oral Oral Oral   Resp: 18 18 18    Height: 5' (1.524 m)     Weight: 113 lb 10.7 oz (51.56 kg)   114 lb 3.2 oz (51.8 kg)  SpO2: 97% 100% 95%    Well developed and nourished in no acute distress HENT normal Neck supple   Carotids brisk and full without bruits Crackles  Regular rate and rhythm, no murmurs or gallops Abd-soft with active BS without hepatomegaly No Clubbing cyanosis tr edema Skin-warm and dry A & Oriented  Grossly normal sensory and motor function   :    Intake/Output Summary (Last 24 hours) at 12/05/11 0750 Last data filed at 12/05/11 0611  Gross per 24 hour  Intake   1043 ml  Output    1550 ml  Net   -507 ml    LABS: Basic Metabolic Panel:  Lab 12/03/11 1610 12/02/11 0405 12/01/11 1728  NA 134* 137 135  K 4.0 4.4 4.0  CL 96 99 97  CO2 28 28 26   GLUCOSE 85 130* 107*  BUN 47* 45* 45*  CREATININE 1.43* 1.46* 1.40*  CALCIUM 8.7 9.0 --  MG -- -- --  PHOS -- -- --   Cardiac Enzymes:  Basename 12/03/11 0300  CKTOTAL --  CKMB --  CKMBINDEX --  TROPONINI <0.30   CBC:  Lab 12/05/11 0645 12/04/11 0004 12/03/11 1323 12/03/11 0649 12/03/11 0250 12/01/11 1728  WBC 7.5 8.3 6.7 -- 9.4 10.1  NEUTROABS -- -- -- -- -- 8.4*  HGB 11.0* 10.3* 10.4* 9.2* 9.6* 13.2  HCT 32.7* 30.2* 30.5* 27.6* 28.9* 40.0  MCV 91.3 92.4 91.6 -- 92.3 93.7  PLT 163 137* 140* -- 139* 171   PROTIME:  Basename 12/05/11 0645 12/04/11 0455 12/03/11 0649  LABPROT 19.4* 19.3* 17.5*  INR 1.70* 1.69* 1.48   Liver Function Tests:  BNP (last 3 results)  Basename 12/01/11 1728  PROBNP 867.7*      ASSESSMENT AND PLAN:  Patient Active Hospital Problem List: Hemorrhagic shock (12/03/2011)  Tachy-brady syndrome (12/16/2010)  Atrial fibrillation (12/16/2010)  Pacemaker-MDT (06/26/2011)  DVT (deep venous thrombosis) (11/10/2011)  Diastolic heart failure (11/10/2011)  Failure to thrive (12/01/2011) * Abdominal pain (12/02/2011)  SVT (supraventricular tachycardia) (12/03/2011)  Warfarin-induced coagulopathy (12/03/2011)    For now continue dilt;  Add  SCDs Ambulate  Incentive spirotmetry   Signed, Sherryl Manges MD  12/05/2011

## 2011-12-05 NOTE — Progress Notes (Signed)
Patient ID: Rachel Shah, female   DOB: 07/02/1910, 78 y.o.   MRN: [id number]  [facility] PROGRESS NOTE  Rachel Shah:[id number] DOB: 08/21/1910 DOA: 12/01/2011 PCP: Eustaquio Boyden, MD  Brief narrative: Pt is 78 yo female who presented to [facility] with progressively worsening shortness of breath associated with 2 pillow orthopnea, progressive failure to thrive. She has diagnosis of chronic diastolic CHF with most recent  EF 55% (12/02/2011) and is now status post pacemaker implantation after being diagnosed with tachy-brady syndrome in the setting of atrial fibrillation with RVR. In the past, she has not felt to be a Coumadin candidate. She follows with Dr. Graciela Husbands, cardiology.    1. Hemorrhagic shock due to hemoperitoneum in the setting of overanticoagulation due to coumadin - s/p 1 units of PRBCS and 2 units of FFP. INR corrected by 12/03/11. Bleeding stopped by November 6. Hemoglobin has remained stable at around 10.3 Shock resolved by November 7. Hg and Hct remain stable and at pt's baseline. 2. Acute blood loss anemia - Hb dropped from 13.2 to 9.2 . Post transfusion hb is up to 10.4 on 12/03/11 and has remained stable since 3. Tachybrady syndrome - s/p pacemaker no further plans for anticoagulation, appreciate cardiology input  4. HTN: - Holding all blood pressure medications due to shock 5. HL: - Resumed Lipitor on November 7 6. SVT - resolved with Adenosine iv 12/03/11  Consultants:  PCCM  Cardiology  CCS  Procedures/Studies: 1. Mild to moderate amounts of hemoperitoneum, the etiology of which is indeterminate on this examination. Differential considerations include possible splenic laceration as there is a subtle contour abnormality within the peripheral aspect of the spleen (however this is without definitive contrast extravasation). Another etiology are two short segment abnormal loops of small bowel within the midline and left lateral aspect of the pelvis. Given the  geographic involvement of the loops of small bowel as well as extensive atherosclerotic disease within the abdominal aorta, mesenteric ischemia is a possible differential possibility.  2. 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 This finding is without associated pancreatic ductal dilatation or peripancreatic stranding. Comparison to prior examinations (if available) is recommended. If no comparisons exist, further evaluation with non emergent pancreatic protocol MRI may be performed as clinically indicated.  3. Age indeterminate moderate (approximately 50%) compression deformity of the L1 vertebral body.  Antibiotics:  None  Code Status: Partial Family Communication: Pt at bedside, daughter Disposition Plan: PT evaluation  HPI/Subjective: No events overnight.   Objective: Filed Vitals:   12/05/11 0608 12/05/11 1426 12/05/11 1539 12/05/11 2015  BP:   111/55 131/67  Pulse:  72 68 64  Temp:   97.9 F (36.6 C) 97.2 F (36.2 C)  TempSrc:   Oral Oral  Resp:   18 18  Height:      Weight: 51.8 kg (114 lb 3.2 oz)     SpO2:   100% 98%    Intake/Output Summary (Last 24 hours) at 12/05/11 2041 Last data filed at 12/05/11 1859  Gross per 24 hour  Intake    840 ml  Output   1525 ml  Net   -685 ml    Exam:   General:  Pt is alert, follows commands appropriately, not in acute distress  Cardiovascular: Regular rate and rhythm, S1/S2, no murmurs, no rubs, no gallops  Respiratory: Clear to auscultation bilaterally, no wheezing, no crackles, no rhonchi  Abdomen: Soft, non tender, non distended, bowel sounds  present, no guarding  Extremities: No edema, pulses DP and PT palpable bilaterally  Neuro: Grossly nonfocal  Data Reviewed: Basic Metabolic Panel:  Lab 12/05/11 1610 12/03/11 0250 12/02/11 0405 12/01/11 1728  NA 136 134* 137 135  K 4.0 4.0 4.4 4.0  CL 99 96 99 97  CO2 27 28 28 26   GLUCOSE 91 85 130* 107*  BUN  32* 47* 45* 45*  CREATININE 1.11* 1.43* 1.46* 1.40*  CALCIUM 8.8 8.7 9.0 8.9  MG -- -- -- --  PHOS -- -- -- --   CBC:  Lab 12/05/11 0645 12/04/11 0004 12/03/11 1323 12/03/11 0649 12/03/11 0250 12/01/11 1728  WBC 7.5 8.3 6.7 -- 9.4 10.1  NEUTROABS -- -- -- -- -- 8.4*  HGB 11.0* 10.3* 10.4* 9.2* 9.6* --  HCT 32.7* 30.2* 30.5* 27.6* 28.9* --  MCV 91.3 92.4 91.6 -- 92.3 93.7  PLT 163 137* 140* -- 139* 171   Cardiac Enzymes:  Lab 12/03/11 0300 12/02/11 0405 12/01/11 2205 12/01/11 1728  CKTOTAL -- -- -- --  CKMB -- -- -- --  CKMBINDEX -- -- -- --  TROPONINI <0.30 <0.30 <0.30 <0.30    Recent Results (from the past 240 hour(s))  MRSA PCR SCREENING     Status: Normal   Collection Time   12/03/11  3:55 AM      Component Value Range Status Comment   MRSA by PCR NEGATIVE  NEGATIVE Final      Scheduled Meds:   . atorvastatin  10 mg Oral QPC supper  . diltiazem  30 mg Oral Q6H   Continuous Infusions:    Debbora Presto, MD  [facility] Pager [phone]  If 7PM-7AM, please contact night-coverage [email or web address] Password [email or web address] 12/05/2011, 8:41 PM   LOS: 4 days

## 2011-12-05 NOTE — Evaluation (Signed)
Occupational Therapy Evaluation Patient Details Name: Rachel Shah MRN: [id number] DOB: May 22, 1910 Today's Date: 12/05/2011 Time: 4098-1191 OT Time Calculation (min): 15 min  OT Assessment / Plan / Recommendation Clinical Impression  Pt admitted with FTT, dyspnea and hemoperitoneum. Will benefit from acute OT services to address below problem list in prep for d/c home with daughter.    OT Assessment  Patient needs continued OT Services    Follow Up Recommendations  Home health OT;Supervision/Assistance - 24 hour    Barriers to Discharge      Equipment Recommendations  Rolling walker with 5" wheels;3 in 1 bedside comode    Recommendations for Other Services    Frequency  Min 2X/week    Precautions / Restrictions Precautions Precautions: Fall   Pertinent Vitals/Pain See vitals    ADL  Eating/Feeding: Performed;Independent Where Assessed - Eating/Feeding: Edge of bed Lower Body Bathing: Simulated;Supervision/safety Where Assessed - Lower Body Bathing: Unsupported sitting Lower Body Dressing: Performed;Supervision/safety Where Assessed - Lower Body Dressing: Unsupported sitting Toilet Transfer: Simulated;Minimal assistance Toilet Transfer Method: Sit to stand Toilet Transfer Equipment:  (bed) Equipment Used: Gait belt Transfers/Ambulation Related to ADLs: 2 person HHA for ambulation due to slight unsteadiness. ADL Comments: Assist for standing components of ADLs due to balance. Pt able to cross ankles over knees to don/doff socks.     OT Diagnosis: Generalized weakness;Cognitive deficits  OT Problem List: Decreased strength;Decreased activity tolerance;Impaired balance (sitting and/or standing);Decreased cognition;Decreased knowledge of use of DME or AE OT Treatment Interventions: Self-care/ADL training;DME and/or AE instruction;Therapeutic activities;Cognitive remediation/compensation;Patient/family education;Balance training   OT Goals Acute Rehab OT Goals OT Goal  Formulation: With patient Time For Goal Achievement: 12/19/11 Potential to Achieve Goals: Good ADL Goals Pt Will Perform Grooming: with modified independence;Standing at sink ADL Goal: Grooming - Progress: Goal set today Pt Will Perform Lower Body Dressing: with modified independence;Sit to stand from chair;Sit to stand from bed ADL Goal: Lower Body Dressing - Progress: Goal set today Pt Will Transfer to Toilet: with modified independence;Ambulation;with DME;Comfort height toilet ADL Goal: Toilet Transfer - Progress: Goal set today Pt Will Perform Toileting - Clothing Manipulation: with modified independence;Sitting on 3-in-1 or toilet;Standing ADL Goal: Toileting - Clothing Manipulation - Progress: Goal set today Pt Will Perform Toileting - Hygiene: with modified independence;Sit to stand from 3-in-1/toilet ADL Goal: Toileting - Hygiene - Progress: Goal set today Miscellaneous OT Goals Miscellaneous OT Goal #1: Pt will perform static standing balance task with mod I for 2-3 in prep for ADLs. OT Goal: Miscellaneous Goal #1 - Progress: Goal set today  Visit Information  Last OT Received On: 12/05/11 Assistance Needed: +1 PT/OT Co-Evaluation/Treatment: Yes    Subjective Data      Prior Functioning     Home Living Lives With: Daughter (dgtr is 77yo) Available Help at Discharge: Family;Available 24 hours/day Type of Home: House Home Access: Stairs to enter [employer] of Steps: 4 Home Layout: Two level;Able to live on main level with bedroom/bathroom;1/2 bath on main level Bathroom Toilet: Standard Home Adaptive Equipment: Straight cane Prior Function Level of Independence: Independent Able to Take Stairs?: Yes Driving: Yes 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[employer]         Vision/Perception     Cognition  Overall Cognitive Status: Impaired Area of Impairment: Safety/judgement Arousal/Alertness: Awake/alert Orientation Level: Appears intact for  tasks assessed Behavior During Session: [facility] for tasks performed Safety/Judgement: Decreased awareness of need for assistance;Decreased safety judgement for tasks assessed Cognition - Other Comments: pt very much a talker  and can be difficult to redirect given [facility] as well    Extremity/Trunk Assessment Right Upper Extremity Assessment RUE ROM/Strength/Tone: [facility] for tasks assessed Left Upper Extremity Assessment LUE ROM/Strength/Tone: [facility] for tasks assessed Right Lower Extremity Assessment RLE ROM/Strength/Tone: [facility] for tasks assessed Left Lower Extremity Assessment LLE ROM/Strength/Tone: [facility] for tasks assessed Trunk Assessment Trunk Assessment: Kyphotic     Mobility Bed Mobility Bed Mobility: Supine to Sit;Sit to Supine Supine to Sit: 4: Min assist;HOB flat Sit to Supine: 4: Min guard;HOB flat Details for Bed Mobility Assistance: cueing to initiate Transfers Transfers: Sit to Stand;Stand to Sit Sit to Stand: 4: Min assist;From bed Stand to Sit: 4: Min assist;To bed Details for Transfer Assistance: assist for balance and anterior translation     Shoulder Instructions     Exercise     Balance     End of Session OT - End of Session Equipment Utilized During Treatment: Gait belt Activity Tolerance: Patient tolerated treatment well Patient left: in bed;with call bell/phone within reach Nurse Communication: Mobility status  GO   12/05/2011 Cipriano Mile OTR/L Pager [phone] Office [phone]   Cipriano Mile 12/05/2011, 4:42 PM

## 2011-12-06 DIAGNOSIS — D689 Coagulation defect, unspecified: Secondary | ICD-10-CM

## 2011-12-06 DIAGNOSIS — I5033 Acute on chronic diastolic (congestive) heart failure: Secondary | ICD-10-CM

## 2011-12-06 LAB — PROTIME-INR: Prothrombin Time: 19.1 seconds — ABNORMAL HIGH (ref 11.6–15.2)

## 2011-12-06 LAB — CBC
Hemoglobin: 11.6 g/dL — ABNORMAL LOW (ref 12.0–15.0)
MCH: 31 pg (ref 26.0–34.0)
MCHC: 33.8 g/dL (ref 30.0–36.0)
Platelets: 186 10*3/uL (ref 150–400)
RDW: 14.9 % (ref 11.5–15.5)

## 2011-12-06 LAB — BASIC METABOLIC PANEL
Calcium: 8.7 mg/dL (ref 8.4–10.5)
GFR calc non Af Amer: 41 mL/min — ABNORMAL LOW (ref >90)
Glucose, Bld: 84 mg/dL (ref 70–99)
Sodium: 138 mEq/L (ref 135–145)

## 2011-12-06 MED ORDER — BISACODYL 10 MG RE SUPP
10.0000 mg | Freq: Every day | RECTAL | Status: DC | PRN
  Administered 2011-12-06: 10 mg via RECTAL
  Filled 2011-12-06: qty 1

## 2011-12-06 NOTE — Progress Notes (Signed)
[facility] PROGRESS NOTE  MARIELLEN HECIMOVICH ZOX:[id number] DOB: 10-Aug-1910 DOA: 12/01/2011 PCP: Eustaquio Boyden, MD  Assessment/Plan: 1. Hemorrhagic shock due to hemoperitoneum in the setting of overanticoagulation due to coumadin s/p 1 units of PRBCS and 2 units of FFP. INR corrected by 12/03/11. Bleeding stopped by November 6. Hemoglobin has remained stable at around 10.3  Shock resolved by November 7 2. Acute blood loss anemia - Hb dropped from 13.2 to 9.2 . Post transfusion hb is up to 10.4 on 12/03/11  3. Tachybrady syndrome - s/p pacemaker no further plans for anticoagulation 4. HTN: - Holding all blood pressure medications due to shock 5. HL: - Resumed Lipitor on November 7 6. SVT - resolved with Adenosine iv 12/03/11 Code Status: Limited  Family Communication: daughter  Disposition Plan:  Home 12/08/11   Consultants:  PCCM  Cardiology   CCS  Procedures:  CT  HPI/Subjective: No complaints   Objective: Filed Vitals:   12/05/11 2015 12/06/11 0659 12/06/11 1105 12/06/11 1407  BP: 131/67 116/55 105/66 110/62  Pulse: 64 60 70 65  Temp: 97.2 F (36.2 C) 97.7 F (36.5 C)  97 F (36.1 C)  TempSrc: Oral Oral  Oral  Resp: 18 19 18 19   Height:      Weight:  51.302 kg (113 lb 1.6 oz)    SpO2: 98% 98% 99% 99%    Intake/Output Summary (Last 24 hours) at 12/06/11 1846 Last data filed at 12/06/11 1756  Gross per 24 hour  Intake    920 ml  Output   1025 ml  Net   -105 ml   Filed Weights   12/04/11 1702 12/05/11 0608 12/06/11 0659  Weight: 51.56 kg (113 lb 10.7 oz) 51.8 kg (114 lb 3.2 oz) 51.302 kg (113 lb 1.6 oz)    Exam:   General:  axox3  Cardiovascular: RRR  Respiratory: CTAB  Abdomen: soft, minimal tenderness. Quiet   Data Reviewed: Basic Metabolic Panel:  Lab 12/06/11 8119 12/05/11 0645 12/03/11 0250 12/02/11 0405 12/01/11 1728  NA 138 136 134* 137 135  K 4.0 4.0 4.0 4.4 4.0  CL 101 99 96 99 97  CO2 25 27 28 28 26   GLUCOSE 84 91 85 130*  107*  BUN 28* 32* 47* 45* 45*  CREATININE 1.07 1.11* 1.43* 1.46* 1.40*  CALCIUM 8.7 8.8 8.7 9.0 8.9  MG -- -- -- -- --  PHOS -- -- -- -- --   Liver Function Tests:  Lab 12/01/11 1728  AST 25  ALT 17  ALKPHOS 54  BILITOT 1.6*  PROT 6.6  ALBUMIN 3.3*   No results found for this basename: LIPASE:5,AMYLASE:5 in the last 168 hours No results found for this basename: AMMONIA:5 in the last 168 hours CBC:  Lab 12/06/11 0530 12/05/11 0645 12/04/11 0004 12/03/11 1323 12/03/11 0649 12/03/11 0250 12/01/11 1728  WBC 6.9 7.5 8.3 6.7 -- 9.4 --  NEUTROABS -- -- -- -- -- -- 8.4*  HGB 11.6* 11.0* 10.3* 10.4* 9.2* -- --  HCT 34.3* 32.7* 30.2* 30.5* 27.6* -- --  MCV 91.7 91.3 92.4 91.6 -- 92.3 --  PLT 186 163 137* 140* -- 139* --   Cardiac Enzymes:  Lab 12/03/11 0300 12/02/11 0405 12/01/11 2205 12/01/11 1728  CKTOTAL -- -- -- --  CKMB -- -- -- --  CKMBINDEX -- -- -- --  TROPONINI <0.30 <0.30 <0.30 <0.30   BNP (last 3 results)  Basename 12/01/11 1728  PROBNP 867.7*   CBG: No results found for  this basename: GLUCAP:5 in the last 168 hours  Recent Results (from the past 240 hour(s))  MRSA PCR SCREENING     Status: Normal   Collection Time   12/03/11  3:55 AM      Component Value Range Status Comment   MRSA by PCR NEGATIVE  NEGATIVE Final      Studies: No results found.  Scheduled Meds:    . atorvastatin  10 mg Oral QPC supper  . diltiazem  30 mg Oral Q6H  . feeding supplement  237 mL Oral BID BM  . sodium chloride  3 mL Intravenous Q12H   Continuous Infusions:   Principal Problem:  *Hemorrhagic shock Active Problems:  Tachy-brady syndrome  Atrial fibrillation  Nocturnal dyspnea/choking  Pacemaker-MDT  DVT (deep venous thrombosis)  Acute on chronic diastolic heart failure  Failure to thrive  Abdominal pain  SVT (supraventricular tachycardia)  Anemia due to blood loss, acute  Warfarin-induced coagulopathy      Gen Clagg  [facility] Pager [phone].  If 8PM-8AM, please contact night-coverage at [email or web address], password [facility] 12/06/2011, 6:46 PM  LOS: 5 days

## 2011-12-06 NOTE — Progress Notes (Signed)
   SUBJECTIVE: The patient is doing well today.  At this time, she denies chest pain, shortness of breath, or any new concerns.     Marland Kitchen atorvastatin  10 mg Oral QPC supper  . diltiazem  30 mg Oral Q6H  . feeding supplement  237 mL Oral BID BM  . sodium chloride  3 mL Intravenous Q12H      OBJECTIVE: Physical Exam: Filed Vitals:   12/05/11 1426 12/05/11 1539 12/05/11 2015 12/06/11 0659  BP:  111/55 131/67 116/55  Pulse: 72 68 64 60  Temp:  97.9 F (36.6 C) 97.2 F (36.2 C) 97.7 F (36.5 C)  TempSrc:  Oral Oral Oral  Resp:  18 18 19   Height:      Weight:    113 lb 1.6 oz (51.302 kg)  SpO2:  100% 98% 98%    Intake/Output Summary (Last 24 hours) at 12/06/11 1013 Last data filed at 12/06/11 0850  Gross per 24 hour  Intake    600 ml  Output   1250 ml  Net   -650 ml    Telemetry reveals sinus rhythm  GEN- The patient is elderly appearing, alert and oriented x 3 today.   Head- normocephalic, atraumatic Eyes-  Sclera clear, conjunctiva pink Ears- hearing intact Oropharynx- clear Neck- supple, Lungs- Clear to ausculation bilaterally, normal work of breathing Heart- Regular rate and rhythm, no murmurs, rubs or gallops, PMI not laterally displaced GI- soft, NT, ND, + BS Extremities- no clubbing, cyanosis, or edema  LABS: Basic Metabolic Panel:  Basename 12/06/11 0530 12/05/11 0645  NA 138 136  K 4.0 4.0  CL 101 99  CO2 25 27  GLUCOSE 84 91  BUN 28* 32*  CREATININE 1.07 1.11*  CALCIUM 8.7 8.8  MG -- --  PHOS -- --   Liver Function Tests: No results found for this basename: AST:2,ALT:2,ALKPHOS:2,BILITOT:2,PROT:2,ALBUMIN:2 in the last 72 hours No results found for this basename: LIPASE:2,AMYLASE:2 in the last 72 hours CBC:  Basename 12/06/11 0530 12/05/11 0645  WBC 6.9 7.5  NEUTROABS -- --  HGB 11.6* 11.0*  HCT 34.3* 32.7*  MCV 91.7 91.3  PLT 186 163   ASSESSMENT AND PLAN:  Principal Problem:  *Hemorrhagic shock Active Problems:  Tachy-brady  syndrome  Atrial fibrillation  Nocturnal dyspnea/choking  Pacemaker-MDT  DVT (deep venous thrombosis)  Acute on chronic diastolic heart failure  Failure to thrive  Abdominal pain  SVT (supraventricular tachycardia)  Anemia due to blood loss, acute  Warfarin-induced coagulopathy  1. Hemoperitoneum in setting of supratherapeutic INR - conservative management  2. Acute blood loss anemia - Hgb/Hct stable; given FFP and vitamin K for reversal of supratherapeutic INR   3. PSVT - adenosine sensitive; no recurrence   4. PAFib and tachy-brady syndrome, PPM in place   5. Acute renal insufficiency   6. History of both diastolic and systolic HF but normal LV function by technically limited echo 12/02/2011  Doing well at this time No new CV recs Will see as needed over the weekend.  Hillis Range, MD 12/06/2011 10:13 AM

## 2011-12-07 LAB — PROTIME-INR: INR: 1.43 (ref 0.00–1.49)

## 2011-12-07 MED ORDER — DILTIAZEM HCL ER COATED BEADS 120 MG PO CP24
120.0000 mg | ORAL_CAPSULE | Freq: Every day | ORAL | Status: DC
  Administered 2011-12-08: 120 mg via ORAL
  Filled 2011-12-07: qty 1

## 2011-12-07 MED ORDER — DILTIAZEM HCL ER 60 MG PO CP12: 120.0000 mg | ORAL_CAPSULE | Freq: Every day | ORAL | Status: DC

## 2011-12-07 NOTE — Progress Notes (Signed)
[facility] PROGRESS NOTE  Rachel Shah ZOX:[id number] DOB: [birth year] DOA: 12/01/2011 PCP: Eustaquio Boyden, MD  Assessment/Plan: 1. Hemorrhagic shock due to hemoperitoneum in the setting of overanticoagulation due to coumadin s/p 1 units of PRBCS and 2 units of FFP. INR corrected by 12/03/11. Bleeding stopped by November 6. Hemoglobin has remained stable at around 10.3  Shock resolved by November 7 2. Acute blood loss anemia - Hb dropped from 13.2 to 9.2 . Post transfusion hb is up to 10.4 on 12/03/11  3. Tachybrady syndrome - s/p pacemaker no further plans for anticoagulation 4. HTN: - Holding all blood pressure medications due to shock 5. HL: - Resumed Lipitor on November 7 6. SVT - resolved with Adenosine iv 12/03/11 Code Status: Limited  Family Communication: daughter  Disposition Plan:  Home 12/08/11   Consultants:  PCCM  Cardiology   CCS  Procedures:  CT  HPI/Subjective: No new events   Objective: Filed Vitals:   12/06/11 2034 12/07/11 0452 12/07/11 1251 12/07/11 1326  BP: 123/49 122/74 128/72 120/67  Pulse: 59 63  63  Temp: 97.8 F (36.6 C) 97.9 F (36.6 C)  97.3 F (36.3 C)  TempSrc: Oral Oral  Oral  Resp: 19 19  19   Height:      Weight:  51 kg (112 lb 7 oz)    SpO2: 100% 100%  97%    Intake/Output Summary (Last 24 hours) at 12/07/11 1735 Last data filed at 12/07/11 0830  Gross per 24 hour  Intake    120 ml  Output   1303 ml  Net  -1183 ml   Filed Weights   12/05/11 0608 12/06/11 0659 12/07/11 0452  Weight: 51.8 kg (114 lb 3.2 oz) 51.302 kg (113 lb 1.6 oz) 51 kg (112 lb 7 oz)    Exam:   General:  axox3  Cardiovascular: RRR  Respiratory: CTAB  Abdomen: soft, minimal tenderness. Quiet   Data Reviewed: Basic Metabolic Panel:  Lab 12/06/11 8119 12/05/11 0645 12/03/11 0250 12/02/11 0405 12/01/11 1728  NA 138 136 134* 137 135  K 4.0 4.0 4.0 4.4 4.0  CL 101 99 96 99 97  CO2 25 27 28 28 26   GLUCOSE 84 91 85 130* 107*  BUN 28*  32* 47* 45* 45*  CREATININE 1.07 1.11* 1.43* 1.46* 1.40*  CALCIUM 8.7 8.8 8.7 9.0 8.9  MG -- -- -- -- --  PHOS -- -- -- -- --   Liver Function Tests:  Lab 12/01/11 1728  AST 25  ALT 17  ALKPHOS 54  BILITOT 1.6*  PROT 6.6  ALBUMIN 3.3*   No results found for this basename: LIPASE:5,AMYLASE:5 in the last 168 hours No results found for this basename: AMMONIA:5 in the last 168 hours CBC:  Lab 12/06/11 0530 12/05/11 0645 12/04/11 0004 12/03/11 1323 12/03/11 0649 12/03/11 0250 12/01/11 1728  WBC 6.9 7.5 8.3 6.7 -- 9.4 --  NEUTROABS -- -- -- -- -- -- 8.4*  HGB 11.6* 11.0* 10.3* 10.4* 9.2* -- --  HCT 34.3* 32.7* 30.2* 30.5* 27.6* -- --  MCV 91.7 91.3 92.4 91.6 -- 92.3 --  PLT 186 163 137* 140* -- 139* --   Cardiac Enzymes:  Lab 12/03/11 0300 12/02/11 0405 12/01/11 2205 12/01/11 1728  CKTOTAL -- -- -- --  CKMB -- -- -- --  CKMBINDEX -- -- -- --  TROPONINI <0.30 <0.30 <0.30 <0.30   BNP (last 3 results)  Basename 12/01/11 1728  PROBNP 867.7*   CBG: No results found for  this basename: GLUCAP:5 in the last 168 hours  Recent Results (from the past 240 hour(s))  MRSA PCR SCREENING     Status: Normal   Collection Time   12/03/11  3:55 AM      Component Value Range Status Comment   MRSA by PCR NEGATIVE  NEGATIVE Final      Studies: No results found.  Scheduled Meds:    . atorvastatin  10 mg Oral QPC supper  . diltiazem  120 mg Oral Daily  . feeding supplement  237 mL Oral BID BM  . sodium chloride  3 mL Intravenous Q12H  . [DISCONTINUED] diltiazem  30 mg Oral Q6H   Continuous Infusions:   Principal Problem:  *Hemorrhagic shock Active Problems:  Tachy-brady syndrome  Atrial fibrillation  Nocturnal dyspnea/choking  Pacemaker-MDT  DVT (deep venous thrombosis)  Acute on chronic diastolic heart failure  Failure to thrive  Abdominal pain  SVT (supraventricular tachycardia)  Anemia due to blood loss, acute  Warfarin-induced  coagulopathy      Rachel Shah  [facility] Pager [phone]. If 8PM-8AM, please contact night-coverage at [email or web address], password [facility] 12/07/2011, 5:35 PM  LOS: 6 days

## 2011-12-08 ENCOUNTER — Telehealth: Payer: Self-pay | Admitting: Family Medicine

## 2011-12-08 MED ORDER — RISEDRONATE SODIUM 150 MG PO TABS: 150.0000 mg | ORAL_TABLET | ORAL | Status: DC

## 2011-12-08 MED ORDER — DILTIAZEM HCL ER COATED BEADS 120 MG PO CP24: 120.0000 mg | ORAL_CAPSULE | Freq: Every day | ORAL | Status: DC

## 2011-12-08 NOTE — Telephone Encounter (Signed)
Courtney from [facility] called to get Rachel Shah a new pt appointment  Advise her dr walker and dr Lorin Picket are out till feb dr Darrick Huntsman out till march Courtney wanted to know if we could work this patient in as new pt sooner Please advise  Please call daugher with appointment  If cannot work in please call courtney at [facility]  [phone]

## 2011-12-08 NOTE — Telephone Encounter (Signed)
Left message for pt daughter  to call office Dr walker stated she could see her dec 27 early am for 45 min appointment

## 2011-12-08 NOTE — Telephone Encounter (Signed)
See Dr Sonny Dandy message

## 2011-12-08 NOTE — Progress Notes (Signed)
IV d/c'd.  Tele d/c'd.  Pt d/c'd to home.  Home meds and d/c instructions have been discussed with pt.  Pt denies any questions or concerns at this time.  Pt leaving unit via wheelchair and appears in no acute distress. Menelik Mcfarren RN 

## 2011-12-08 NOTE — Discharge Summary (Signed)
Physician Discharge Summary  Rachel Shah ZOX:[id number] DOB: July 24, 1910 DOA: 12/01/2011  PCP: Eustaquio Boyden, MD patient was scheduled to see him but she ended up in the hospital instead of that.  Admit date: 12/01/2011 Discharge date: 12/08/2011  Time spent: 60 minutes  Discharge Diagnoses:  Hemorrhagic shock - resolved Hemoperitoneum from Coumadin  Tachy-brady syndrome  Atrial fibrillation - not a Coumadin candidate  Pacemaker-MDT  Acute on chronic diastolic heart failure  Failure to thrive  SVT (supraventricular tachycardia)  Anemia due to blood loss, acute  Warfarin-induced coagulopathy   Discharge Condition: Fair, no driving, needs 81-XBJY supervision  Diet recommendation: Heart healthy  Filed Weights   12/06/11 0659 12/07/11 0452 12/08/11 0510  Weight: 51.302 kg (113 lb 1.6 oz) 51 kg (112 lb 7 oz) 50.8 kg (111 lb 15.9 oz)    History of present illness:  78 year old woman was admitted from the cardiology clinic for failure to thrive. Eventually we were able to diagnose that she was having hemoperitoneum from Coumadin-induced coagulopathy   Hospital Course:  1. Hemorrhagic shock due to hemoperitoneum in the setting of overanticoagulation due to coumadin s/p 1 units of PRBCS and 2 units of FFP. INR corrected by 12/03/11. Bleeding stopped by November 6. Hemoglobin has remained stable at around 10.3 Shock resolved by November 7. No future plans for anticoagulation 2. Acute blood loss anemia - Hb dropped from 13.2 to 9.2 . Post transfusion hb is up to 10.4 on 12/03/11  3. Tachybrady syndrome - s/p pacemaker no further plans for anticoagulation. Dr. Johney Frame recommended Cardizem sustained release 120 mg daily 4. HTN: - Stopped all prior hypertensive medications as she does not need them at the moment 5. HL: - Resumed Lipitor on November 7 6. SVT - resolved with Adenosine iv 12/03/11  Discharge Exam: Filed Vitals:   12/07/11 1251 12/07/11 1326 12/07/11 2126 12/08/11 0510    BP: 128/72 120/67 133/86 138/82  Pulse:  63 79 74  Temp:  97.3 F (36.3 C) 98.4 F (36.9 C) 98.5 F (36.9 C)  TempSrc:  Oral Oral Oral  Resp:  19 20 20   Height:      Weight:    50.8 kg (111 lb 15.9 oz)  SpO2:  97% 99% 99%    General: axox3 Cardiovascular: rrr Respiratory: ctab Abdomen : soft, NT, bs   Discharge Instructions  Discharge Orders    Future Orders Please Complete By Expires   Diet - low sodium heart healthy      Increase activity slowly          Medication List     As of 12/08/2011  8:41 AM    STOP taking these medications         digoxin 0.125 MG tablet   Commonly known as: LANOXIN      furosemide 40 MG tablet   Commonly known as: LASIX      lisinopril 5 MG tablet   Commonly known as: PRINIVIL,ZESTRIL      metoprolol 50 MG tablet   Commonly known as: LOPRESSOR      spironolactone 25 MG tablet   Commonly known as: ALDACTONE      VITAMIN E PO      warfarin 7.5 MG tablet   Commonly known as: COUMADIN      TAKE these medications         atorvastatin 10 MG tablet   Commonly known as: LIPITOR   Take 1 tablet (10 mg total) by mouth daily.  diltiazem 120 MG 24 hr capsule   Commonly known as: CARDIZEM CD   Take 1 capsule (120 mg total) by mouth daily.      risedronate 150 MG tablet   Commonly known as: ACTONEL   Take 1 tablet (150 mg total) by mouth every 30 (thirty) days.           Follow-up Information    Schedule an appointment as soon as possible for a visit with Eustaquio Boyden, MD.   Contact information:   [address] [place] [address] [phone]           The results of significant diagnostics from this hospitalization (including imaging, microbiology, ancillary and laboratory) are listed below for reference.    Significant Diagnostic Studies: X-ray Chest Pa And Lateral  12/01/2011  *RADIOLOGY REPORT*  Clinical Data: 78 year old female with cough and hemoptysis.  CHEST - 2 VIEW  Comparison:  06/21/2010 and earlier.  Findings: Stable left chest cardiac pacemaker.  Lower lung volumes. Crowding of markings at both lung bases, some patchy opacity.  No definite consolidation or effusion.  No pneumothorax or edema. Stable cardiac size and mediastinal contours.  Osteopenia. Multilevel spinal compression deformities, no definite acute osseous abnormality.  IMPRESSION: Low lung volumes with patchy bibasilar opacity which most resembles atelectasis.   Original Report Authenticated By: Erskine Speed, M.D.    Ct Abdomen Pelvis W Contrast  12/02/2011  *RADIOLOGY REPORT*  Clinical Data: Lower abdominal pain since last night, evaluate for GI bleed or hematoma  CT ABDOMEN AND PELVIS WITH CONTRAST  Technique:  Multidetector CT imaging of the abdomen and pelvis was performed following the standard protocol during bolus administration of intravenous contrast.  Contrast: 75mL OMNIPAQUE IOHEXOL 300 MG/ML  SOLN  Comparison: None.  Findings:  There is a moderate amount of high-density fluid compatible with blood within the pelvic cul-de-sac extending along the bilateral pericolic gutters to extend to involve the inferior aspect of the liver and spleen.   There is a very subtle contour abnormality within the peripheral aspect of the spleen (image 16.  No definitive evidence of contrast extravasation at this location.  There is a short segment loop of small bowel within the midline of the lower pelvis (axial images 56 through 58) which demonstrates wall thickening.  There is minimal similar inflammatory change with an additional short segment of small bowel within the left hemi pelvis (image 56, series 2).  Neither of these loops of bowel result in enteric obstruction.  No pneumoperitoneum, pneumatosis or portal venous gas.  Extensive atherosclerotic calcifications of a normal caliber abdominal aorta.  The proximal aspects of the major branch vessels of the abdominal aorta, including the IMA, appear patent on this non CTA  examination.  Normal appearance of the retrocecal appendix.  Normal hepatic contour.  Note is made of an approximately 1.2 cm hypoattenuating (11 HU) cyst within the anterior segment right lobe of the liver (image 17, series 2).  There is an additional approximately 1.5 cm cyst within the posterior segment right lobe of the liver (image 13).  Additional smaller sub centimeter hypoattenuating hepatic lesions are too small to accurately characterize the favored to represent additional hepatic cysts. Normal appearance of the gallbladder.  No definite intra or extrahepatic biliary ductal dilatation.  There is a serpiginous hypoattenuating structure regional to the pancreatic head (coronal images 45 through 48), measuring approximately 1.1 x 2.2 cm in greatest oblique axial dimension (axial image 26, series 2).  This structure is  without associated pancreatic ductal dilatation.  There is age appropriate pancreatic atrophy.  Scattered calcifications within the spleen, sequela of prior granulomas infection.  There is symmetric enhancement and excretion of the bilateral kidneys.  Incidental note is made of a right-sided parapelvic cyst. Additional bilateral hypoattenuating lesions are too small to accurately characterize the favored to represent additional renal cysts.  No urinary obstruction. No definite renal stones.  No urinary obstruction or perinephric stranding.  Normal appearance of the bilateral adrenal glands.  Limited visualization lower thorax is negative for focal airspace opacity.  Cardiomegaly.  Pacer leads seen within the right atrium and ventricle.  Calcifications within the papillary musculature of the left ventricle.  No pericardial effusion.  Age indeterminate moderate (approximately 50%) compression deformity of the L1 vertebral body.  No retropulsion of the posterior elements.  Moderate to severe multilevel lumbar spine degenerative change, worse at L4 - L5.  Post pinning of the left femoral neck.   IMPRESSION:  1.  Mild to moderate amounts of hemoperitoneum, the etiology of which is indeterminate on this examination.  Differential considerations include possible splenic laceration as there is a subtle contour abnormality within the peripheral aspect of the spleen (however this is without definitive contrast extravasation). Another etiology are two short segment abnormal loops of small bowel within the midline and left lateral aspect of the pelvis. Given the geographic involvement of the loops of small bowel as well as extensive atherosclerotic disease within the abdominal aorta, mesenteric ischemia is a possible differential possibility.  2.  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  This finding is without associated pancreatic ductal dilatation or peripancreatic stranding.  Comparison to prior examinations (if available) is recommended.  If no comparisons exist, further evaluation with non emergent pancreatic protocol MRI may be performed as clinically indicated. 3.  Age indeterminate moderate (approximately 50%) compression deformity of the L1 vertebral body.  Above findings discussed with Mahala Menghini, MD at 1921.   Original Report Authenticated By: Tacey Ruiz, MD     Microbiology: Recent Results (from the past 240 hour(s))  MRSA PCR SCREENING     Status: Normal   Collection Time   12/03/11  3:55 AM      Component Value Range Status Comment   MRSA by PCR NEGATIVE  NEGATIVE Final      Labs: Basic Metabolic Panel:  Lab 12/06/11 1610 12/05/11 0645 12/03/11 0250 12/02/11 0405 12/01/11 1728  NA 138 136 134* 137 135  K 4.0 4.0 4.0 4.4 4.0  CL 101 99 96 99 97  CO2 25 27 28 28 26   GLUCOSE 84 91 85 130* 107*  BUN 28* 32* 47* 45* 45*  CREATININE 1.07 1.11* 1.43* 1.46* 1.40*  CALCIUM 8.7 8.8 8.7 9.0 8.9  MG -- -- -- -- --  PHOS -- -- -- -- --   Liver Function Tests:  Lab 12/01/11 1728  AST 25  ALT 17  ALKPHOS 54  BILITOT 1.6*    PROT 6.6  ALBUMIN 3.3*   No results found for this basename: LIPASE:5,AMYLASE:5 in the last 168 hours No results found for this basename: AMMONIA:5 in the last 168 hours CBC:  Lab 12/06/11 0530 12/05/11 0645 12/04/11 0004 12/03/11 1323 12/03/11 0649 12/03/11 0250 12/01/11 1728  WBC 6.9 7.5 8.3 6.7 -- 9.4 --  NEUTROABS -- -- -- -- -- -- 8.4*  HGB 11.6* 11.0* 10.3* 10.4* 9.2* -- --  HCT 34.3* 32.7* 30.2* 30.5* 27.6* -- --  MCV 91.7 91.3 92.4 91.6 -- 92.3 --  PLT 186 163 137* 140* -- 139* --   Cardiac Enzymes:  Lab 12/03/11 0300 12/02/11 0405 12/01/11 2205 12/01/11 1728  CKTOTAL -- -- -- --  CKMB -- -- -- --  CKMBINDEX -- -- -- --  TROPONINI <0.30 <0.30 <0.30 <0.30   BNP: BNP (last 3 results)  Basename 12/01/11 1728  PROBNP 867.7*   CBG: No results found for this basename: GLUCAP:5 in the last 168 hours     Signed:  Duwan Adrian  [facility] 12/08/2011, 8:41 AM

## 2011-12-08 NOTE — Telephone Encounter (Signed)
Reviewed Dr Tilman Neat message.  Let me know if problem

## 2011-12-08 NOTE — Telephone Encounter (Signed)
I could work him in one of the days around Christmas when we have saved numerous visits for urgent issues. I would set aside at minimum.

## 2011-12-08 NOTE — Telephone Encounter (Signed)
This pt is already being seen by Dr. Sharen Hones??

## 2011-12-08 NOTE — Telephone Encounter (Signed)
That's what i ask and pt canceled that appointment.

## 2011-12-09 ENCOUNTER — Telehealth: Payer: Self-pay | Admitting: Family Medicine

## 2011-12-09 NOTE — Telephone Encounter (Signed)
Pt daughter Tobey Grim aware of appointment 12/27 @ 10

## 2011-12-09 NOTE — Telephone Encounter (Signed)
Noted pt admitted with hemorrhagic shock and hemopertioneum and discharged on 12/08/2011.  New pt to me. i'm not sure if she's follow up here or at [facility] Can we call for update on how she is doing and to schedule appt end of this week for f/u or early next week?

## 2011-12-10 ENCOUNTER — Ambulatory Visit (INDEPENDENT_AMBULATORY_CARE_PROVIDER_SITE_OTHER): Payer: Medicare Other | Admitting: Internal Medicine

## 2011-12-10 ENCOUNTER — Other Ambulatory Visit (INDEPENDENT_AMBULATORY_CARE_PROVIDER_SITE_OTHER): Payer: Medicare Other

## 2011-12-10 ENCOUNTER — Encounter: Payer: Self-pay | Admitting: Internal Medicine

## 2011-12-10 VITALS — BP 82/52 | HR 73 | Temp 97.4°F | Ht 60.0 in | Wt 116.0 lb

## 2011-12-10 DIAGNOSIS — D62 Acute posthemorrhagic anemia: Secondary | ICD-10-CM

## 2011-12-10 DIAGNOSIS — Z0000 Encounter for general adult medical examination without abnormal findings: Secondary | ICD-10-CM

## 2011-12-10 LAB — CBC
HCT: 37.8 % (ref 36.0–46.0)
Hemoglobin: 12.4 g/dL (ref 12.0–15.0)
MCHC: 32.8 g/dL (ref 30.0–36.0)
Platelets: 253 10*3/uL (ref 150.0–400.0)
RDW: 16.1 % — ABNORMAL HIGH (ref 11.5–14.6)

## 2011-12-10 NOTE — Patient Instructions (Addendum)
Thank you for allowing me to spend time with you today. If you have any further questions, please feel free to call the office at any time and leave a message or you can utilize the MyChart features to send me a quick note, and I will get back with you as soon as possible.  Have a wonderful day! Jaquelyn Sakamoto, NP-C  

## 2011-12-10 NOTE — Progress Notes (Signed)
HPI  Pt presents to the clinic today to establish care. She has no active concerns. She was told by the hospital that she needed to followup after her recent admission to the hospital for coumadin induced acute blood loss. She was discharged on 12/08/11. She has been feeling well since her discharge. She is accompanied by her daughter with whom she lives with and who takes care of her. Past Medical History  Diagnosis Date  . Atrial flutter   . Supraventricular tachycardia   . Tachy-brady syndrome     post [employer] revealed pulse generator dual lead pacemaker  . CHF (congestive heart failure)     a. hx of diastolic CHF;  b. recent dx with systolic CHF  . Angina at rest   . Transient ischemic attack 2007    Remote history of  . Fibroids     History of uterine fibroids  . Thrombocytopenia     Mild  . Abnormal thyroid function test     Mildly abnormal thyroid function studies with a TSH of 7.984 (upper limit of normal 4.5), free T4 within normal limits at 1.21 and T3  slightly low at 69.2 (lower limit of normal 80), follow up with primary care  . Hypertension   . High cholesterol   . Pacemaker   . Shortness of breath   . History of blood transfusion 12/02/2011  . Lower GI bleeding     "dr told me last year that I might ought to have OR but not at my age" (12/02/2011)  . Hard of hearing     bilaterally "(12/02/2011)    Current Outpatient Prescriptions  Medication Sig Dispense Refill  . atorvastatin (LIPITOR) 10 MG tablet Take 1 tablet (10 mg total) by mouth daily.  90 tablet  3  . diltiazem (CARDIZEM CD) 120 MG 24 hr capsule Take 1 capsule (120 mg total) by mouth daily.  30 capsule  0  . risedronate (ACTONEL) 150 MG tablet Take 1 tablet (150 mg total) by mouth every 30 (thirty) days.  30 tablet  0    Allergies  Allergen Reactions  . Plavix (Clopidogrel Bisulfate)     STOMACH UPSTET    Family History  Problem Relation Age of Onset  . Cancer Sister   . Other      Negative for  coronary artery disease    History   Social History  . Marital Status: Widowed    Spouse Name: N/A    Number of Children: N/A  . Years of Education: N/A   Occupational History  . Not on file.   Social History Main Topics  . Smoking status: Never Smoker   . Smokeless tobacco: Never Used  . Alcohol Use: No  . Drug Use: No  . Sexually Active: No   Other Topics Concern  . Not on file   Social History Narrative  . No narrative on file   ROS:  Constitutional: Denies fever, malaise, fatigue, headache or abrupt weight changes.  HEENT: Pt reports blurred vision. Denies eye pain, eye redness, ear pain, ringing in the ears, wax buildup, runny nose, nasal congestion, bloody nose, or sore throat. Respiratory:  Pt reports shortness of breath with exertion. Denies difficulty breathing,cough or sputum production.   Cardiovascular: Denies chest pain, chest tightness, palpitations or swelling in the hands or feet.  Gastrointestinal: Denies abdominal pain, bloating, constipation, diarrhea or blood in the stool.  GU: Denies frequency, urgency, pain with urination, blood in urine, odor or discharge. Musculoskeletal:  Pt reports difficulty with gait, uses cane. Denies decrease in range of motion, muscle pain or joint pain and swelling.  Skin: Denies redness, rashes, lesions or ulcercations.  Neurological:  Pt reports difficulty with memory and tremors. Denies dizziness, difficulty with speech or problems with balance and coordination.   No other specific complaints in a complete review of systems (except as listed in HPI above).  Objective:  BP 82/52  Pulse 73  Temp 97.4 F (36.3 C) (Oral)  Ht 5' (1.524 m)  Wt 116 lb (52.617 kg)  BMI 22.65 kg/m2  SpO2 99% Wt Readings from Last 3 Encounters:  12/10/11 116 lb (52.617 kg)  12/08/11 111 lb 15.9 oz (50.8 kg)  12/01/11 113 lb (51.256 kg)    General: Appears her stated age, well developed, well nourished in NAD. HEENT: Head: normal shape  and size; Eyes: sclera white, no icterus, conjunctiva pink, PERRLA and EOMs intact; Ears: Tm's gray and intact, normal light reflex; Nose: mucosa pink and moist, septum midline; Throat/Mouth: Teeth present, mucosa pink and moist, no lesions or ulcerations noted.  Neck: Normal range of motion. Neck supple, trachea midline. No massses, lumps or thyromegaly present.  Cardiovascular: Irregular  rate and rhythm. S1,S2 noted.  No murmur, rubs or gallops noted. No JVD or BLE edema. No carotid bruits noted. Pulmonary/Chest: Normal effort and positive vesicular breath sounds. No respiratory distress. No wheezes, rales or ronchi noted.  Abdomen: Soft and nontender. Normal bowel sounds, no bruits noted. No distention or masses noted. Liver, spleen and kidneys non palpable. Musculoskeletal: Normal range of motion. No signs of joint swelling. Gait slowed but steady with use of a cane. Neurological: Alert and oriented. Hard of hearing, use hearing aid in right ear. Cranial nerves II-XII intact. Coordination normal. +DTRs bilaterally. Psychiatric: Mood and affect normal. Behavior is normal. Judgment and thought content normal.   EKG:  BMET    Component Value Date/Time   NA 138 12/06/2011 0530   K 4.0 12/06/2011 0530   CL 101 12/06/2011 0530   CO2 25 12/06/2011 0530   GLUCOSE 84 12/06/2011 0530   BUN 28* 12/06/2011 0530   CREATININE 1.07 12/06/2011 0530   CALCIUM 8.7 12/06/2011 0530   GFRNONAA 41* 12/06/2011 0530   GFRAA 48* 12/06/2011 0530    Lipid Panel     Component Value Date/Time   CHOL 128 06/19/2010 0514   TRIG 112 06/19/2010 0514   HDL 41 06/19/2010 0514   CHOLHDL 3.1 06/19/2010 0514   VLDL 22 06/19/2010 0514   LDLCALC  Value: 65        Total Cholesterol/HDL:CHD Risk Coronary Heart Disease Risk Table                     Men   Women  1/2 Average Risk   3.4   3.3  Average Risk       5.0   4.4  2 X Average Risk   9.6   7.1  3 X Average Risk  23.4   11.0        Use the calculated Patient Ratio above and the  CHD Risk Table to determine the patient's CHD Risk.        ATP III CLASSIFICATION (LDL):  <100     mg/dL   Optimal  161-096  mg/dL   Near or Above                    Optimal  130-159  mg/dL  Borderline  160-189  mg/dL   High  >161     mg/dL   Very High 0/96/0454 0981    CBC    Component Value Date/Time   WBC 6.9 12/06/2011 0530   RBC 3.74* 12/06/2011 0530   HGB 11.6* 12/06/2011 0530   HCT 34.3* 12/06/2011 0530   PLT 186 12/06/2011 0530   MCV 91.7 12/06/2011 0530   MCH 31.0 12/06/2011 0530   MCHC 33.8 12/06/2011 0530   RDW 14.9 12/06/2011 0530   LYMPHSABS 0.8 12/01/2011 1728   MONOABS 0.8 12/01/2011 1728   EOSABS 0.1 12/01/2011 1728   BASOSABS 0.0 12/01/2011 1728    Hgb A1C No results found for this basename: HGBA1C    Assessment and Plan:  Establish care Anemia d/t acute blood loss  Will recheck CBC at this time, all other labs checked in the hospital BP a little low at this visit. If not due to acute blood loss, may want to cut Diltiazem dose in half Stop taking your lipitor, will recheck this in 2 months Per patient wishes, followup with Dr. Sharen Hones

## 2011-12-11 NOTE — Telephone Encounter (Signed)
Noted. Thanks.

## 2011-12-11 NOTE — Telephone Encounter (Signed)
Spoke with patient's daughter, Rachel Shah. She is doing very well. The hospital had her follow up at the [facility] office yesterday after d/c due to no openings here. The patient was not establishing there, only following up after hospital stay. Daughter prefers to come here instead of [place] due to distance. Since she already had hospital follow up, I scheduled her for a new medicare patient appt. I instructed her daughter to call me directly if she needed anything before that appointment. She verbalized understanding. Appointment cancelled with [facility] office.

## 2011-12-30 ENCOUNTER — Encounter: Payer: Self-pay | Admitting: Physician Assistant

## 2012-01-20 ENCOUNTER — Emergency Department (HOSPITAL_COMMUNITY): Payer: Medicare Other

## 2012-01-20 ENCOUNTER — Encounter: Payer: Self-pay | Admitting: Internal Medicine

## 2012-01-20 ENCOUNTER — Emergency Department (HOSPITAL_COMMUNITY)
Admission: EM | Admit: 2012-01-20 | Discharge: 2012-01-20 | Disposition: A | Discharge status: Home / Self Care | Payer: Medicare Other | Attending: Emergency Medicine | Admitting: Emergency Medicine

## 2012-01-20 ENCOUNTER — Encounter (HOSPITAL_COMMUNITY): Payer: Self-pay | Admitting: *Deleted

## 2012-01-20 ENCOUNTER — Encounter: Payer: Self-pay | Admitting: Physician Assistant

## 2012-01-20 DIAGNOSIS — I5032 Chronic diastolic (congestive) heart failure: Secondary | ICD-10-CM | POA: Diagnosis present

## 2012-01-20 DIAGNOSIS — I10 Essential (primary) hypertension: Secondary | ICD-10-CM | POA: Insufficient documentation

## 2012-01-20 DIAGNOSIS — I471 Supraventricular tachycardia, unspecified: Secondary | ICD-10-CM

## 2012-01-20 DIAGNOSIS — I5033 Acute on chronic diastolic (congestive) heart failure: Secondary | ICD-10-CM

## 2012-01-20 DIAGNOSIS — Z86718 Personal history of other venous thrombosis and embolism: Secondary | ICD-10-CM | POA: Insufficient documentation

## 2012-01-20 DIAGNOSIS — H918X9 Other specified hearing loss, unspecified ear: Secondary | ICD-10-CM | POA: Insufficient documentation

## 2012-01-20 DIAGNOSIS — R0602 Shortness of breath: Secondary | ICD-10-CM | POA: Insufficient documentation

## 2012-01-20 DIAGNOSIS — Z8673 Personal history of transient ischemic attack (TIA), and cerebral infarction without residual deficits: Secondary | ICD-10-CM | POA: Insufficient documentation

## 2012-01-20 DIAGNOSIS — Z950 Presence of cardiac pacemaker: Secondary | ICD-10-CM

## 2012-01-20 DIAGNOSIS — Z862 Personal history of diseases of the blood and blood-forming organs and certain disorders involving the immune mechanism: Secondary | ICD-10-CM | POA: Insufficient documentation

## 2012-01-20 DIAGNOSIS — E7800 Pure hypercholesterolemia, unspecified: Secondary | ICD-10-CM | POA: Insufficient documentation

## 2012-01-20 DIAGNOSIS — M542 Cervicalgia: Secondary | ICD-10-CM | POA: Insufficient documentation

## 2012-01-20 DIAGNOSIS — Z8679 Personal history of other diseases of the circulatory system: Secondary | ICD-10-CM | POA: Insufficient documentation

## 2012-01-20 DIAGNOSIS — Z7983 Long term (current) use of bisphosphonates: Secondary | ICD-10-CM | POA: Insufficient documentation

## 2012-01-20 DIAGNOSIS — J811 Chronic pulmonary edema: Secondary | ICD-10-CM

## 2012-01-20 DIAGNOSIS — I498 Other specified cardiac arrhythmias: Secondary | ICD-10-CM | POA: Insufficient documentation

## 2012-01-20 DIAGNOSIS — I495 Sick sinus syndrome: Secondary | ICD-10-CM

## 2012-01-20 LAB — CBC WITH DIFFERENTIAL/PLATELET
Basophils Absolute: 0 10*3/uL (ref 0.0–0.1)
Basophils Relative: 0 % (ref 0–1)
Eosinophils Absolute: 0.1 10*3/uL (ref 0.0–0.7)
HCT: 39.8 % (ref 36.0–46.0)
Hemoglobin: 13.1 g/dL (ref 12.0–15.0)
MCH: 31.5 pg (ref 26.0–34.0)
MCHC: 32.9 g/dL (ref 30.0–36.0)
Monocytes Absolute: 0.5 10*3/uL (ref 0.1–1.0)
Monocytes Relative: 6 % (ref 3–12)
Neutro Abs: 6.6 10*3/uL (ref 1.7–7.7)
Neutrophils Relative %: 80 % — ABNORMAL HIGH (ref 43–77)
RDW: 16.5 % — ABNORMAL HIGH (ref 11.5–15.5)

## 2012-01-20 LAB — BASIC METABOLIC PANEL WITH GFR
BUN: 22 mg/dL (ref 6–23)
CO2: 24 meq/L (ref 19–32)
Calcium: 9 mg/dL (ref 8.4–10.5)
Chloride: 107 meq/L (ref 96–112)
Creatinine, Ser: 1.09 mg/dL (ref 0.50–1.10)
GFR calc Af Amer: 47 mL/min — ABNORMAL LOW
GFR calc non Af Amer: 41 mL/min — ABNORMAL LOW
Glucose, Bld: 94 mg/dL (ref 70–99)
Potassium: 4 meq/L (ref 3.5–5.1)
Sodium: 140 meq/L (ref 135–145)

## 2012-01-20 LAB — PRO B NATRIURETIC PEPTIDE: Pro B Natriuretic peptide (BNP): 13933 pg/mL — ABNORMAL HIGH (ref 0–450)

## 2012-01-20 LAB — TROPONIN I: Troponin I: <0.3 ng/mL

## 2012-01-20 MED ORDER — FUROSEMIDE 20 MG PO TABS: 20.0000 mg | ORAL_TABLET | Freq: Every day | ORAL | Status: DC

## 2012-01-20 MED ORDER — DILTIAZEM HCL ER COATED BEADS 240 MG PO CP24: 240.0000 mg | ORAL_CAPSULE | Freq: Every day | ORAL | Status: DC

## 2012-01-20 NOTE — ED Notes (Addendum)
Corrie Dandy Sloane-Daughter [phone] (cell)

## 2012-01-20 NOTE — ED Provider Notes (Signed)
Medical screening examination/treatment/procedure(s) were performed by non-physician practitioner and as supervising physician I was immediately available for consultation/collaboration.  Juliet Rude. Rubin Payor, MD 01/20/12 [phone]

## 2012-01-20 NOTE — ED Notes (Signed)
Charge RN at the bedside to interrogate pacemaker. Pt tolerating without difficulty. Vital signs stable.

## 2012-01-20 NOTE — ED Notes (Addendum)
Per EMS:  Pt's family called EMS because pt was complaining of SOB and neck pain.  When EMS arrived pt was in SVT, initial rate of 180.  Pt was given 6mg  of adenosine and she converted back to NSR in 30-40 seconds, pt does have pacemaker.   After conversion pt st's she feels much better, pt has no complaints now.  Pt st's she took her 120mg  cardizem this am.

## 2012-01-20 NOTE — ED Notes (Addendum)
Old and new ECG given to Dr. Ethelda Chick

## 2012-01-20 NOTE — Consult Note (Signed)
Cardiology Consult Note   Patient ID: Rachel Shah MRN: [id number], DOB/AGE: 1910-10-29 78 y.o. Date of Encounter: 01/20/2012  Primary Physician: Rachel Boyden, MD Primary Cardiologist: Rachel Shah  Chief Complaint:  SVT  HPI: 19 -year-old female with a history of SVT called EMS for shortness of breath and anterior neck pain. EMS found her to be in SVT and she was given 6 mg of adenosine, converting to normal sinus rhythm.   Rachel Shah admits she did not sleep last night but denies any distress. Early this morning, she had sudden onset of jerking and severe posterior neck pain. She has never had this before. She had a slight awareness of tachycardia palpitations and was a little short of breath but denies presyncope or dizziness. When her neck pain and jerking did not improve, EMS was called. ECG his are reviewed and show SVT and the conversion to sinus rhythm with adenosine. She has maintained sinus rhythm since being given the adenosine. The timing is unclear but her neck pain and jerking have resolved. She also had a choking feeling that has improved. Which of these symptoms are related to the SVT is unclear. Currently she is resting comfortably.  Past Medical History  Diagnosis Date  . Atrial flutter   . Supraventricular tachycardia   . Tachy-brady syndrome     post Medtr[employer] revealed pulse generator dual lead pacemaker  . CHF (congestive heart failure)     a. hx of diastolic CHF;  b. dx with systolic CHF 09/2011 in [place], [place];   c. admx to [facility] 09/2011  in [place], [place] => s/p bilat thoracentesis with neg cytology;   d. Echo 9/13:  mod LAE, mild LVH, EF 30-35%, ant septal severe HK, Gr 2 diast dysfn, mild reduced RVSF, mild MR, Ao sclerosis without AS, mild to mod TR, RVSP 51, mild to mod PR;  e. Echo 11/13: EF 60%  . Angina at rest   . Transient ischemic attack 2007    Remote history of  . Fibroids     History of uterine fibroids  . Thrombocytopenia     Mild  . Abnormal  thyroid function test     Mildly abnormal thyroid function studies with a TSH of 7.984 (upper limit of normal 4.5), free T4 within normal limits at 1.21 and T3  slightly low at 69.2 (lower limit of normal 80), follow up with primary care  . Hypertension   . High cholesterol   . Pacemaker   . Shortness of breath   . History of blood transfusion 12/02/2011  . Lower GI bleeding     "dr told me last year that I might ought to have OR but not at my age" (12/02/2011)  . Hard of hearing     bilaterally "(12/02/2011)  . DVT (deep venous thrombosis)     a. partially occlusive right peroneal vein DVT by [place] at [facility] in [place], [place] 09/2011     Surgical History:  Past Surgical History  Procedure Date  . Cataract extraction w/ intraocular lens implant 2012    left  . Dilation and curettage of uterus   . Cardiac pacemaker placement ?02/2010     I have reviewed the patient's current medications. Prior to Admission medications   Medication Sig Start Date End Date Taking? Authorizing Provider  diltiazem (CARDIZEM CD) 120 MG 24 hr capsule Take 1 capsule (120 mg total) by mouth daily. 12/08/11  Yes Rachel Luanne Bras, MD  risedronate (ACTONEL) 150 MG tablet  Take 1 tablet (150 mg total) by mouth every 30 (thirty) days. 12/08/11  Yes Rachel Luanne Bras, MD    Scheduled Meds:   Continuous Infusions:   PRN Meds:.    Allergies:  Allergies  Allergen Reactions  . Plavix (Clopidogrel Bisulfate)     STOMACH UPSTET   History   Social History  . Marital Status: Widowed    Spouse Name: N/A    Number of Children: N/A  . Years of Education: N/A   Occupational History  . Not on file.   Social History Main Topics  . Smoking status: Never Smoker   . Smokeless tobacco: Never Used  . Alcohol Use: No  . Drug Use: No  . Sexually Active: No   Other Topics Concern  . Not on file   Social History Narrative  . No narrative on file    Family History  Problem Relation Age of Onset  . Cancer Sister    . Other      Negative for coronary artery disease   Family Status  Relation Status Death Age  . Mother Deceased   . Father Deceased   . Sister Deceased 7    cancer    Review of Systems:  See history of present illness. She is extremely hard of hearing. She has chronic musculoskeletal aches and pains. Full 14-point review of systems otherwise negative except as noted above.   Physical Exam: Blood pressure 139/70, pulse 63, temperature 97.5 F (36.4 C), temperature source Oral, resp. rate 18, SpO2 100.00%. General: Well developed, well nourished, elderly female in no acute distress. Head: Normocephalic, atraumatic, sclera non-icteric, no xanthomas, nares are without discharge. Dentition: poor Neck: No carotid bruits. JVD not elevated. No thyromegally Lungs: Good expansion bilaterally. without wheezes or rhonchi. Basilar rales 2 crackles. Heart: Regular rate and rhythm with S1 S2.  No S3 or S4.  No murmur, no rubs, or gallops appreciated. Abdomen: Soft, non-tender, non-distended with normoactive bowel sounds. No hepatomegaly. No rebound/guarding. No obvious abdominal masses. Msk:  Strength and tone appear normal for age. No joint deformities or effusions, no spine or costo-vertebral angle tenderness. Extremities: No clubbing or cyanosis. No edema.  Distal pedal pulses are 2+ in 4 extrem Neuro: Alert and oriented X 3. Moves all extremities spontaneously. No focal deficits noted. Psych:  Responds to questions appropriately with a normal affect. Skin: No rashes or lesions noted  Labs:   Lab Results  Component Value Date   WBC 8.3 01/20/2012   HGB 13.1 01/20/2012   HCT 39.8 01/20/2012   MCV 95.7 01/20/2012   PLT 210 01/20/2012   No results found for this basename: INR in the last 72 hours   Lab 01/20/12 0638  NA 140  K 4.0  CL 107  CO2 24  BUN 22  CREATININE 1.09  CALCIUM 9.0  PROT --  BILITOT --  ALKPHOS --  ALT --  AST --  GLUCOSE 94    Basename 01/20/12 0642    CKTOTAL --  CKMB --  TROPONINI <0.30    Radiology/Studies:  Dg Chest Port 1 View 01/20/2012  *RADIOLOGY REPORT*  Clinical Data: Supraventricular tachycardia.  Neck pain.  PORTABLE CHEST - 1 VIEW  Comparison: Chest radiograph performed 12/01/2011  Findings: The lungs are well-aerated.  Bibasilar airspace opacities may reflect mild pulmonary edema or possibly pneumonia.  Underlying vascular congestion is noted.  No significant pleural effusion or pneumothorax is seen.  The cardiomediastinal silhouette is borderline normal in size; calcification is noted  within the aortic arch.  A pacemaker is seen overlying the left chest wall, with leads ending overlying the right atrium and right ventricle.  No acute osseous abnormalities are seen.  IMPRESSION: Bibasilar airspace opacities may reflect mild pulmonary edema or possibly pneumonia.  Underlying vascular congestion noted.   Original Report Authenticated By: Tonia Ghent, M.D.    Echo: 12/02/2011 Study Conclusions - Left ventricle: Calcified target in the LV is probably calcified papillary muscle. Technically limited study. The cavity size was mildly reduced. The estimated ejection fraction was 60%. Wall motion was normal; there were no regional wall motion abnormalities. - Aortic valve: Sclerosis without stenosis. Trivial regurgitation. - Right ventricle: The RV is not seen well. Pacer wire or catheter noted in right ventricle.  ECG: 20-Jan-2012 05:31:42 [facility]/ED ROUTINE RECORD AGE IS NOT ENTERED, ASSUMED TO BE 78 YEARS OLD FOR PURPOSE OF ECG INTERPRETATION SINUS RHYTHM ~ normal P axis, V-rate 50- 99 PROBABLE LEFT ATRIAL ABNORMALITY ~ P >35mS, <-0.19mV V1 LAD, CONSIDER LEFT ANTERIOR FASCICULAR BLOCK ~ axis(240,-40), S>R II III aVF CONSIDER ANTEROSEPTAL INFARCT ~ Q >17mS, V1 V2 Standard 12 Lead Report ~ Unconfirmed Interpretation Abnormal ECG 1mm/s 1mm/mV 150Hz  8.0.1 12SL 235 CID: [id number] Referred by: Unconfirmed Vent.  rate 79 BPM PR interval 128 ms QRS duration 106 ms QT/QTc 404/463 ms P-R-T axes -22 -63 76  ASSESSMENT AND PLAN:  Active Problems:  SVT (supraventricular tachycardia) - MD to review the interrogation of her device, she appears to have had multiple episodes of tachycardia. She is atrial pacing 47.9% of the time. She is on diltiazem CD 120 mg daily. Can increase her diltiazem to 240 mg daily, f/u early in office. 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 She has had multiple episodes of SVT, hopefully will do better if can keep in SR.  History of diastolic CHF: The patient has an abnormal chest x-ray. A BNP is pending. She may need diuresis, M.D. review and advise. Currently, she is denying shortness of breath or dyspnea on exertion, edema or PND. Can add low-dose Lasix 20 mg daily, she is set up for f/u visit next week with BMET.   Signed,  Bjorn Loser Barrett PA-C 01/20/2012, 10:19 AM   I have seen, examined the patient, and reviewed the above assessment and plan.  Changes to above are made where necessary.  The patient presents with adenosine sensitive SVT.  Her pacemaker interrogation is reviewed and confirms that she has had multiple episodes of short RP SVT over the past few weeks.  Longest episode was 10 hours.  Given her advanced age, we should avoid aggressive intervention including ablation if possible.  At this time, I think that the most prudent strategy is to increase diltiazem CD to 240mg  daily and have her follow closely with Dr Graciela Husbands.  She is mildly volume overloaded and will therefore be treated with lasix for 5 days (as above).  Follow-up with Tereso Newcomer in 1 week.  Follow-up with Dr Graciela Husbands at next available.  I think that she is safe to go home today.  Co Sign: Hillis Range, MD 01/20/2012 11:18 AM

## 2012-01-20 NOTE — ED Notes (Signed)
Pt discharged. Waiting on daughter to pick her up.

## 2012-01-20 NOTE — ED Provider Notes (Signed)
History     CSN: [id number]  Arrival date & time 01/20/12  Rachel Shah   First MD Initiated Contact with Patient 01/20/12 [phone]      Chief Complaint  Patient presents with  . Palpitations    (Consider location/radiation/quality/duration/timing/severity/associated sxs/prior treatment) HPI Cardiologist: Dr. Graciela Husbands  Pt to the ER brought in by EMS accompanied by daughter who she lives with for SOB and anterior  neck pain. She has a history of arrhythmias and was noted by EMS to be in SVT. She was given 6mg   Adenosine and converted back to normal sinus rhythm where she has stayed up until this point. Pt is  feeling much better but continues to endorse a pain in her posterior neck which is abnormal for her.  She is almost completely deaf so accurate and complete HPI/ROS is difficult to obtain but appears  well and her vitals are stable  Past Medical History  Diagnosis Date  . Atrial flutter   . Supraventricular tachycardia   . Tachy-brady syndrome     post [employer]tr[employer] revealed pulse generator dual lead pacemaker  . CHF (congestive heart failure)     a. hx of diastolic CHF;  b. dx with systolic CHF 09/2011 in [place], [place];   c. admx to [facility] 09/2011  in [place], [place] => s/p bilat thoracentesis with neg cytology;   d. Echo 9/13:  mod LAE, mild LVH, EF 30-35%, ant septal severe HK, Gr 2 diast dysfn, mild reduced RVSF, mild MR, Ao sclerosis without AS, mild to mod TR, RVSP 51, mild to mod PR;  e. Echo 11/13: EF 60%  . Angina at rest   . Transient ischemic attack 2007    Remote history of  . Fibroids     History of uterine fibroids  . Thrombocytopenia     Mild  . Abnormal thyroid function test     Mildly abnormal thyroid function studies with a TSH of 7.984 (upper limit of normal 4.5), free T4 within normal limits at 1.21 and T3  slightly low at 69.2 (lower limit of normal 80), follow up with primary care  . Hypertension   . High cholesterol   . Pacemaker   . Shortness of breath   .  History of blood transfusion 12/02/2011  . Lower GI bleeding     "dr told me last year that I might ought to have OR but not at my age" (12/02/2011)  . Hard of hearing     bilaterally "(12/02/2011)  . DVT (deep venous thrombosis)     a. partially occlusive right peroneal vein DVT by [place] at [facility] in [place], [place] 09/2011    Past Surgical History  Procedure Date  . Cataract extraction w/ intraocular lens implant 2012    left  . Dilation and curettage of uterus   . Cardiac pacemaker placement ?02/2010    Family History  Problem Relation Age of Onset  . Cancer Sister   . Other      Negative for coronary artery disease    History  Substance Use Topics  . Smoking status: Never Smoker   . Smokeless tobacco: Never Used  . Alcohol Use: No    OB History    Grav Para Term Preterm Abortions TAB SAB Ect Mult Living                  Review of Systems  Cardio: Tachycardia Neck: neck pain Unable to do complete ROS: pt deaf  Allergies  Plavix  Home Medications   Current Outpatient Rx  Name  Route  Sig  Dispense  Refill  . DILTIAZEM HCL ER COATED BEADS 120 MG PO CP24   Oral   Take 1 capsule (120 mg total) by mouth daily.   30 capsule   0   . RISEDRONATE SODIUM 150 MG PO TABS   Oral   Take 1 tablet (150 mg total) by mouth every 30 (thirty) days.   30 tablet   0     BP 131/75  Pulse 70  Temp 97.5 F (36.4 C) (Oral)  Resp 18  SpO2 100%  Physical Exam  Constitutional: She appears well-developed and well-nourished. No distress.  HENT:  Head: Normocephalic and atraumatic.  Eyes: Pupils are equal, round, and reactive to light.  Neck: Trachea normal, normal range of motion and full passive range of motion without pain. Neck supple. No JVD present. No tracheal deviation present. No thyromegaly present.  Cardiovascular: Normal rate, normal heart sounds and normal pulses.   Pulmonary/Chest: Effort normal. No stridor. Chest wall is not dull to percussion. She  exhibits no crepitus, no edema, no deformity and no retraction.  Abdominal: Soft. Normal appearance and bowel sounds are normal.  Musculoskeletal: Normal range of motion.  Neurological: She is alert.  Skin: Skin is warm, dry and intact.  Psychiatric: She has a normal mood and affect. Judgment normal. Cognition and memory are normal.    ED Course  Procedures (including critical care time)  Labs Reviewed  CBC WITH DIFFERENTIAL - Abnormal; Notable for the following:    RDW 16.5 (*)     Neutrophils Relative 80 (*)     All other components within normal limits  BASIC METABOLIC PANEL - Abnormal; Notable for the following:    GFR calc non Af Amer 41 (*)     GFR calc Af Amer 47 (*)     All other components within normal limits  TROPONIN I   Dg Chest Port 1 View  01/20/2012  *RADIOLOGY REPORT*  Clinical Data: Supraventricular tachycardia.  Neck pain.  PORTABLE CHEST - 1 VIEW  Comparison: Chest radiograph performed 12/01/2011  Findings: The lungs are well-aerated.  Bibasilar airspace opacities may reflect mild pulmonary edema or possibly pneumonia.  Underlying vascular congestion is noted.  No significant pleural effusion or pneumothorax is seen.  The cardiomediastinal silhouette is borderline normal in size; calcification is noted within the aortic arch.  A pacemaker is seen overlying the left chest wall, with leads ending overlying the right atrium and right ventricle.  No acute osseous abnormalities are seen.  IMPRESSION: Bibasilar airspace opacities may reflect mild pulmonary edema or possibly pneumonia.  Underlying vascular congestion noted.   Original Report Authenticated By: Tonia Ghent, M.D.      1. SVT (supraventricular tachycardia)   2. Pulmonary edema       MDM    Date: 01/20/2012 - After Cardioversion  Rate: 79  Rhythm: normal sinus rhythm  QRS Axis: normal  Intervals: normal  ST/T Wave abnormalities: normal  Conduction Disutrbances:left anterior fascicular block   Narrative Interpretation:   Old EKG Reviewed: no significant changes noted   Pr chest xray pt has mild pulmonary edema vs pneumonia. Clinically pulmonary edema to most likely. Pt has had no cough, fevers, chills or weakness.  Pacemaker Interrogation shows the pt has had multiple short runs of V-Tach in the the past two weeks. She has also had a few episodes of A.fib.   Dr. Rubin Payor has consulted with [facility].  They have agreed to see patient and further evaluate pacemaker.      Dorthula Matas, PA 01/20/12 [phone]

## 2012-01-20 NOTE — ED Notes (Signed)
Cardiology at the bedside to consult. 

## 2012-01-23 ENCOUNTER — Telehealth: Payer: Self-pay

## 2012-01-23 ENCOUNTER — Ambulatory Visit: Payer: Medicare Other | Admitting: Internal Medicine

## 2012-01-23 ENCOUNTER — Other Ambulatory Visit: Payer: Self-pay | Admitting: *Deleted

## 2012-01-23 DIAGNOSIS — I10 Essential (primary) hypertension: Secondary | ICD-10-CM

## 2012-01-23 NOTE — Telephone Encounter (Signed)
Spoke with patient's daughter and rescheduled appt to 01/30/12 at 2:00. Previous appt cancelled. 2:30 slot blocked.

## 2012-01-23 NOTE — Telephone Encounter (Signed)
Please advise where you would like for me to move her to. Thanks

## 2012-01-23 NOTE — Telephone Encounter (Signed)
Let's place her Friday 3rd at 2pm - 30 min slot, and block 2:30 appt. Thanks.

## 2012-01-23 NOTE — Telephone Encounter (Signed)
pts daughter Corrie Dandy said pt seen 01/20/12 [place] with supraventricular tachycardia; pt was d/c home and has appt 01/27/12 to see Tereso Newcomer PA at [facility] cardiology. Pts daughter is to call and schedule f/u appt with Dr Graciela Husbands. Corrie Dandy understood she was to call and set up earlier appt for pt with Dr Sharen Hones (pt has medicare to establish appt with Dr Reece Agar on 02/26/12). Mary advised Dr Reece Agar out of office until 01/29/12 but will send note to Dr Timoteo Expose CMA. Corrie Dandy said pt seems to be doing OK right now. Pt has hx of CHF and pt has pacemaker. Kathrynn Running if condition changes or worsens to call office or Dr Odessa Fleming office or go to Ed if needed. Mary voiced understanding and will wait to hear from Dr Timoteo Expose CMA.Please advise.

## 2012-01-27 ENCOUNTER — Ambulatory Visit (INDEPENDENT_AMBULATORY_CARE_PROVIDER_SITE_OTHER): Payer: Medicare Other | Admitting: Physician Assistant

## 2012-01-27 ENCOUNTER — Other Ambulatory Visit (INDEPENDENT_AMBULATORY_CARE_PROVIDER_SITE_OTHER): Payer: Medicare Other

## 2012-01-27 ENCOUNTER — Encounter: Payer: Self-pay | Admitting: Physician Assistant

## 2012-01-27 ENCOUNTER — Ambulatory Visit (INDEPENDENT_AMBULATORY_CARE_PROVIDER_SITE_OTHER): Payer: Medicare Other | Admitting: *Deleted

## 2012-01-27 ENCOUNTER — Encounter: Payer: Self-pay | Admitting: Internal Medicine

## 2012-01-27 VITALS — BP 146/68 | HR 62 | Ht 60.0 in | Wt 115.5 lb

## 2012-01-27 DIAGNOSIS — I10 Essential (primary) hypertension: Secondary | ICD-10-CM

## 2012-01-27 DIAGNOSIS — I471 Supraventricular tachycardia: Secondary | ICD-10-CM

## 2012-01-27 DIAGNOSIS — R05 Cough: Secondary | ICD-10-CM

## 2012-01-27 DIAGNOSIS — I498 Other specified cardiac arrhythmias: Secondary | ICD-10-CM

## 2012-01-27 DIAGNOSIS — I4891 Unspecified atrial fibrillation: Secondary | ICD-10-CM

## 2012-01-27 DIAGNOSIS — R0989 Other specified symptoms and signs involving the circulatory and respiratory systems: Secondary | ICD-10-CM

## 2012-01-27 DIAGNOSIS — R0602 Shortness of breath: Secondary | ICD-10-CM

## 2012-01-27 DIAGNOSIS — I5032 Chronic diastolic (congestive) heart failure: Secondary | ICD-10-CM

## 2012-01-27 LAB — BASIC METABOLIC PANEL
BUN: 14 mg/dL (ref 6–23)
CO2: 29 mEq/L (ref 19–32)
Calcium: 9.1 mg/dL (ref 8.4–10.5)
Creat: 0.86 mg/dL (ref 0.50–1.10)
Glucose, Bld: 97 mg/dL (ref 70–99)

## 2012-01-27 MED ORDER — METOPROLOL TARTRATE 25 MG PO TABS: 25.0000 mg | ORAL_TABLET | Freq: Two times a day (BID) | ORAL | Status: DC

## 2012-01-27 MED ORDER — GUAIFENESIN ER 600 MG PO TB12: 600.0000 mg | ORAL_TABLET | Freq: Two times a day (BID) | ORAL | Status: DC | PRN

## 2012-01-27 MED ORDER — LORATADINE 10 MG PO TABS: 10.0000 mg | ORAL_TABLET | Freq: Every day | ORAL | Status: DC | PRN

## 2012-01-27 MED ORDER — FUROSEMIDE 20 MG PO TABS: 20.0000 mg | ORAL_TABLET | Freq: Every day | ORAL | Status: DC

## 2012-01-27 NOTE — Patient Instructions (Addendum)
You can Get the Allergies medication Loratadine 10 mg Daily As Needed Over the Counter  You Can Get the Mucinex DM 600 mg Twice Daily As Needed Over the Counter. No Decongestant   Stay on Lasix 20mg  daily   Take Metoprolol Tartrate 25 mg Twice Daily  FOLLOW UP WITH DR. Graciela Husbands ON 03/05/12  @ 9:45

## 2012-01-27 NOTE — Progress Notes (Signed)
[address] [place], [place]  [address] Phone: [phone], Fax:  [phone]  Date:  01/27/2012   Name:  Rachel Shah   DOB:  10-22-1910   MRN:  [id number]  PCP:  Eustaquio Boyden, MD  Primary Cardiologist/Primary Electrophysiologist:  Dr. Sherryl Manges    History of Present Illness: Rachel Shah is a 78 y.o. female who returns for f/u after a visit to the ED with SVT.  The patient previously lived in [place]. Her daughter lives in [place] and she is a patient of Dr. Odessa Fleming. She was visiting in 5/12 when she developed diastolic CHF in the setting of AFib with RVR.  She developed tachybradycardia syndrome while in the hospital and underwent pacemaker implantation. In the past, she has not been felt to be a coumadin candidate.  She also has a hx of HTN, TIA and angina. She has reported problems with angina in the past.  She denies ever having any cardiac catheterization or stress testing. Echo in 5/12 demonstrated moderate LVH, EF 55-60%, mild AI, mild-moderate LAE, lipomatous hypertrophy of the atrial septum.  Patient saw Dr. Graciela Husbands in 5/13 and was having spells of nocturnal dyspnea. Interrogation of her device demonstrated tachycardia with heart rates in the 140s -150s. He suggested restarting her Lopressor at that time.  Patient was admitted to [facility] in [place] in 09/2011.  Echo with EF 30-35%.  I believe she had a thoracentesis for pleural effusion.  She was put on coumadin for DVT.  She was at a rehab facility but left AMA.  She now lives in [place].  She was admitted from the office 11/4-11/11 for failure to thrive.  She was ultimately dx with hemoperitoneum.  She was tx with PRBCs and FFP to correct INR and coumadin was d/c'd.  She was tx with adenosine for SVT during that admission.  Of note, repeat echo demonstrated normal LVF.   Patient was seen in the ED on 01/20/12 by Theodore Demark, PA-C and Dr. Johney Frame. She presented via EMS with SVT. She  converted to NSR with 6 mg of adenosine. Pacemaker was interrogated and demonstrated multiple episodes of short RP SVT in the prior weeks. Longest episode was 10 hours. Ultimately it was felt that aggressive intervention should be avoided given her advanced age if possible. Diltiazem was increased to 240 mg daily. She was noted to be mildly volume overloaded with an elevated BNP and abnormal chest x-ray with bibasilar opacities possibly reflecting pulmonary edema or possibly pneumonia and underlying vascular congestion. She was placed on Lasix 20 mg daily for 5 days plans for early followup today.  She continues to have a cough.  Complains a lot of sinus drainage.  No orthopnea, PND.  No palpitations.  No chest pain.  No syncope.  Weight stable.  Has increased ankle edema.  No significant dyspnea.  Although, she is not that active.  Probably NYHA Class IIb-III.    Labs (10/13):        K 5.2, creatinine 1.1, ALT 18, Hgb 14.6 Labs (01/20/12):   K 4, creatinine 1.09, Hgb 13.1, proBNP [id number]  Wt Readings from Last 3 Encounters:  01/27/12 115 lb 8.6 oz (52.409 kg)  12/10/11 116 lb (52.617 kg)  12/08/11 111 lb 15.9 oz (50.8 kg)     Past Medical History  Diagnosis Date  . Atrial flutter   . Supraventricular tachycardia   . Tachy-brady syndrome     post Medtronic revealed pulse generator dual lead pacemaker  .  CHF (congestive heart failure)     a. hx of diastolic CHF;  b. dx with systolic CHF 09/2011 in [place], [place];   c. admx to [facility] 09/2011  in [place], [place] => s/p bilat thoracentesis with neg cytology;   d. Echo 9/13:  mod LAE, mild LVH, EF 30-35%, ant septal severe HK, Gr 2 diast dysfn, mild reduced RVSF, mild MR, Ao sclerosis without AS, mild to mod TR, RVSP 51, mild to mod PR;  e. Echo 11/13: EF 60%  . Angina at rest   . Transient ischemic attack 2007    Remote history of  . Fibroids     History of uterine fibroids  . Thrombocytopenia     Mild  . Abnormal thyroid function test      Mildly abnormal thyroid function studies with a TSH of 7.984 (upper limit of normal 4.5), free T4 within normal limits at 1.21 and T3  slightly low at 69.2 (lower limit of normal 80), follow up with primary care  . Hypertension   . High cholesterol   . Pacemaker   . Shortness of breath   . History of blood transfusion 12/02/2011  . Lower GI bleeding     "dr told me last year that I might ought to have OR but not at my age" (12/02/2011)  . Hard of hearing     bilaterally "(12/02/2011)  . DVT (deep venous thrombosis)     a. partially occlusive right peroneal vein DVT by [place] at [facility] in [place], [place] 09/2011   Past Surgical History:  The patient  has past surgical history that includes Cataract extraction w/ intraocular lens implant (2012); Dilation and curettage of uterus; and Cardiac pacemaker placement (?02/2010).    Current Outpatient Prescriptions  Medication Sig Dispense Refill  . diltiazem (CARDIZEM CD) 240 MG 24 hr capsule Take 1 capsule (240 mg total) by mouth daily.  30 capsule  11  . furosemide (LASIX) 20 MG tablet Take 1 tablet (20 mg total) by mouth daily. Take 1 tab daily for 5 days, then as directed.  30 tablet  11  . risedronate (ACTONEL) 150 MG tablet Take 1 tablet (150 mg total) by mouth every 30 (thirty) days.  30 tablet  0    Allergies: Allergies  Allergen Reactions  . Plavix (Clopidogrel Bisulfate)     STOMACH UPSTET    Social History:  The patient  reports that she has never smoked. She has never used smokeless tobacco. She reports that she does not drink alcohol or use illicit drugs.    ROS:  Please see the history of present illness.   All other systems reviewed and negative.   PHYSICAL EXAM: VS:  BP 146/68  Pulse 62  Ht 5' (1.524 m)  Wt 115 lb 8.6 oz (52.409 kg)  BMI 22.57 kg/m2 Elderly female noticeably with resting tremor but in NAD. HEENT: normal Neck: no JVD at 90 degrees Cardiac:  normal S1, S2; RRR; no murmur; +S3 Lungs:  clear to  auscultation bilaterally, no wheezing, rhonchi or rales Abd: soft, nontender, no hepatomegaly Ext: 1-2+ bilateral ankle edema Skin: warm and dry Neuro:  CNs 2-12 intact, no focal abnormalities noted  EKG:  A paced, HR 61, LAFB   ASSESSMENT AND PLAN:  1. SVT:   We interrogated her device.  SVT episodes are less frequent.  She has had 3 since her visit to the ED.  Longest was 2 hrs.  Max V rate was 150.  Continue diltiazem.  Add metoprolol 25 mg bid.    2. Chronic Diastolic CHF:   She was put on Lasix in the ED.  Follow up BMET today. She constantly feels poorly.  Still dyspneic.  She still is somewhat volume overloaded, but ankle edema may be more venous insufficiency than CHF.  Continue current dose of Lasix.  Repeat BNP.  Adjust lasix if necessary.  Repeat BMET in 2 weeks.  3. Cough:  She has a long hx of sinusitis.  Recommend OTC loratadine prn and Mucinex DM prn.  Will also check repeat BNP and repeat CXR.  CXR last week with ? Infiltrate vs edema.  Will make sure opacities have cleared.  4. Atrial Fibrillation:   No longer on coumadin due to hemoperitoneum last month.  5. Disposition:   Schedule follow up with Dr. Sherryl Manges next available to discuss further Rx options for SVT.    Signed, Tereso Newcomer, PA-C  3:09 PM 01/27/2012

## 2012-01-30 ENCOUNTER — Encounter: Payer: Self-pay | Admitting: Family Medicine

## 2012-01-30 ENCOUNTER — Ambulatory Visit (INDEPENDENT_AMBULATORY_CARE_PROVIDER_SITE_OTHER): Payer: Medicare Other | Admitting: Family Medicine

## 2012-01-30 ENCOUNTER — Encounter: Payer: Self-pay | Admitting: Internal Medicine

## 2012-01-30 ENCOUNTER — Ambulatory Visit (INDEPENDENT_AMBULATORY_CARE_PROVIDER_SITE_OTHER)
Admission: RE | Admit: 2012-01-30 | Discharge: 2012-01-30 | Disposition: A | Discharge status: Home / Self Care | Payer: Medicare Other | Source: Ambulatory Visit | Attending: Family Medicine | Admitting: Family Medicine

## 2012-01-30 VITALS — BP 128/76 | HR 66 | Temp 97.6°F | Ht 60.0 in | Wt 113.0 lb

## 2012-01-30 DIAGNOSIS — R05 Cough: Secondary | ICD-10-CM

## 2012-01-30 DIAGNOSIS — M810 Age-related osteoporosis without current pathological fracture: Secondary | ICD-10-CM

## 2012-01-30 DIAGNOSIS — R0602 Shortness of breath: Secondary | ICD-10-CM

## 2012-01-30 DIAGNOSIS — I10 Essential (primary) hypertension: Secondary | ICD-10-CM

## 2012-01-30 DIAGNOSIS — I82409 Acute embolism and thrombosis of unspecified deep veins of unspecified lower extremity: Secondary | ICD-10-CM

## 2012-01-30 DIAGNOSIS — I471 Supraventricular tachycardia: Secondary | ICD-10-CM

## 2012-01-30 DIAGNOSIS — I5032 Chronic diastolic (congestive) heart failure: Secondary | ICD-10-CM

## 2012-01-30 DIAGNOSIS — I498 Other specified cardiac arrhythmias: Secondary | ICD-10-CM

## 2012-01-30 DIAGNOSIS — J3489 Other specified disorders of nose and nasal sinuses: Secondary | ICD-10-CM

## 2012-01-30 DIAGNOSIS — R059 Cough, unspecified: Secondary | ICD-10-CM

## 2012-01-30 LAB — PACEMAKER DEVICE OBSERVATION

## 2012-01-30 NOTE — Patient Instructions (Addendum)
Good to meet you today. Return as needed. Xray of chest today ordered by cardiology. Call us with questions. No changes to medicines today. Try claritin at night time.

## 2012-01-30 NOTE — Progress Notes (Signed)
Pacer interrogation only

## 2012-01-30 NOTE — Progress Notes (Signed)
Subjective:    Patient ID: Rachel Shah, female    DOB: 07-31-1910, 79 y.o.   MRN: [id number]  HPI CC: new pt to establish  [name] presents today to establish care with me.  She has h/o atrial flutter, SVT, tachybrady syndrome, combined CHF, h/o TIA (around 2007), HTN, abnormal thyroid function on latest check, h/o DVT September of 2013, and hemoperitoneum with hemorrhagic shock thought 2/2 coumadin so this was stopped.  Prior saw Dr. Kateri Mc [phone]) near [place], [place]).  She recently moved to live with daughter 2/2 recent decline in health and ability to care for herself.  Reviewed recent hospitalizations as well as latest note by Tereso Newcomer.  For hospitalization details, please refer to his thorough note.  She has had recent episodes of SVT and is followed by [facility].  Plan was to avoid aggressive intervention given age and comorbidities.  Coumadin was stopped 2/2 hemoperitoneum.  initially started for afib and DVT.  Also has been rec against aspirin given recent bleeding.  Hard of hearing. Requests CXR today to f/u fluid in lungs.  This was ordered by cards and we will obtain in office today.  Recently started on claritin and mucinex for sinus drainage which has helped.  Finds claritin causing some sedation.    Has been on actonel for last 2-3 years.  Unsure if has had osteoporosis.  Lives with daughter.  Medications and allergies reviewed and updated in chart.  Past histories reviewed and updated if relevant as below. Patient Active Problem List  Diagnosis  . HTN (hypertension)  . chest pain  . Tachy-brady syndrome  . Atrial fibrillation  . Nocturnal dyspnea/choking  . Pacemaker-MDT  . DVT (deep venous thrombosis)  . Acute on chronic diastolic heart failure  . Failure to thrive  . Abdominal pain  . SVT (supraventricular tachycardia)  . Hemorrhagic shock  . Anemia due to blood loss, acute  . Warfarin-induced coagulopathy  .  Osteoporosis   Past Medical History  Diagnosis Date  . Atrial flutter   . Supraventricular tachycardia   . Tachy-brady syndrome     post Medtronic revealed pulse generator dual lead pacemaker  . CHF (congestive heart failure)     a. hx of diastolic CHF;  b. dx with systolic CHF 09/2011 in [place], [place];   c. admx to [facility] 09/2011  in [place], [place] => s/p bilat thoracentesis with neg cytology;   d. Echo 9/13:  mod LAE, mild LVH, EF 30-35%, ant septal severe HK, Gr 2 diast dysfn, mild reduced RVSF, mild MR, Ao sclerosis without AS, mild to mod TR, RVSP 51, mild to mod PR;  e. Echo 11/13: EF 60%  . Angina at rest   . Transient ischemic attack 2007  . Thrombocytopenia     Mild  . Abnormal thyroid function test     Mildly abnormal thyroid function studies with a TSH of 7.984 (upper limit of normal 4.5), free T4 within normal limits at 1.21 and T3  slightly low at 69.2 (lower limit of normal 80), follow up with primary care  . Hypertension   . High cholesterol   . Pacemaker   . Shortness of breath   . History of blood transfusion 12/02/2011  . Lower GI bleeding   . Hard of hearing   . DVT (deep venous thrombosis) 09/2011    partially occlusive right peroneal vein DVT by [place] at [facility] in [place], [place]  . Osteoporosis     presumed  as on actonel and compression deformities noted on CXR  . Nontraumatic hemoperitoneum 11/2011    from coumadin induced coagulopathy so stopped  . Fibroids     hx   Past Surgical History  Procedure Date  . Cataract extraction w/ intraocular lens implant 2012    left  . Dilation and curettage of uterus   . Cardiac pacemaker placement ?02/2010  . Left hip fracture 1987    pins   History  Substance Use Topics  . Smoking status: Never Smoker   . Smokeless tobacco: Never Used  . Alcohol Use: No   Family History  Problem Relation Age of Onset  . Cancer Sister   . CAD Neg Hx    Allergies  Allergen Reactions  . Plavix (Clopidogrel Bisulfate)       STOMACH UPSTET  . Coumadin (Warfarin Sodium) Other (See Comments)    Caused internal bleeding   Current Outpatient Prescriptions on File Prior to Visit  Medication Sig Dispense Refill  . diltiazem (CARDIZEM CD) 240 MG 24 hr capsule Take 1 capsule (240 mg total) by mouth daily.  30 capsule  11  . furosemide (LASIX) 20 MG tablet Take 1 tablet (20 mg total) by mouth daily.  30 tablet  11  . guaiFENesin (MUCINEX) 600 MG 12 hr tablet Take 1 tablet (600 mg total) by mouth 2 (two) times daily as needed for congestion.      Marland Kitchen loratadine (CLARITIN) 10 MG tablet Take 1 tablet (10 mg total) by mouth daily as needed for allergies.      . metoprolol tartrate (LOPRESSOR) 25 MG tablet Take 1 tablet (25 mg total) by mouth 2 (two) times daily.  60 tablet  11  . risedronate (ACTONEL) 150 MG tablet Take 1 tablet (150 mg total) by mouth every 30 (thirty) days.  30 tablet  0      Review of Systems Denies recent chest pain, HA, dizziness, abd pain, nausea/vomiting, diarrhea. Some SOB worse with exertion.  Some chest heaviness attributed to congestion, per pt improving with new meds (claritin and mucinex).    Objective:   Physical Exam  Nursing note and vitals reviewed. Constitutional: She appears well-developed and well-nourished. No distress.       Head tremor at baseline, disappears with intention  HENT:  Head: Normocephalic and atraumatic.  Right Ear: Hearing, tympanic membrane, external ear and ear canal normal.  Left Ear: Hearing, tympanic membrane, external ear and ear canal normal.  Mouth/Throat: Oropharynx is clear and moist. No oropharyngeal exudate.  Eyes: Conjunctivae normal and EOM are normal. Pupils are equal, round, and reactive to light. No scleral icterus.  Neck: Normal range of motion. Neck supple.  Cardiovascular: Normal rate, regular rhythm, normal heart sounds and intact distal pulses.   No murmur heard. Pulmonary/Chest: Effort normal and breath sounds normal. No respiratory  distress. She has no wheezes. She has no rales.       clear  Abdominal: Soft. Bowel sounds are normal. She exhibits no distension and no mass. There is no tenderness. There is no rebound and no guarding.  Musculoskeletal: She exhibits edema (tr pedal edema).  Lymphadenopathy:    She has no cervical adenopathy.  Skin: Skin is warm and dry. No rash noted.  Psychiatric: She has a normal mood and affect.       Assessment & Plan:

## 2012-01-31 ENCOUNTER — Encounter: Payer: Self-pay | Admitting: Family Medicine

## 2012-01-31 DIAGNOSIS — J3489 Other specified disorders of nose and nasal sinuses: Secondary | ICD-10-CM | POA: Insufficient documentation

## 2012-01-31 DIAGNOSIS — M810 Age-related osteoporosis without current pathological fracture: Secondary | ICD-10-CM | POA: Insufficient documentation

## 2012-01-31 NOTE — Assessment & Plan Note (Signed)
Will request records from prior PCP and review.  Per pt has had dexa in past.

## 2012-01-31 NOTE — Assessment & Plan Note (Signed)
Off coumadin, off aspirin.  Will monitor for now.

## 2012-01-31 NOTE — Assessment & Plan Note (Signed)
Chronic, stable. No changes.   

## 2012-01-31 NOTE — Assessment & Plan Note (Addendum)
With congestion.  Suggested she try taking claritin at night to help with daytime sleepiness. Rechecked xray today - clear

## 2012-01-31 NOTE — Assessment & Plan Note (Signed)
Continues to have runs of SVT.  Metoprolol recently restarted and dilt increased.  Seems to be tolerating these changes well.  No changes today.

## 2012-02-02 ENCOUNTER — Telehealth: Payer: Self-pay | Admitting: *Deleted

## 2012-02-02 NOTE — Telephone Encounter (Signed)
Message copied by Tarri Fuller on Mon Feb 02, 2012  9:28 AM ------      Message from: [place], [place] T      Created: Mon Feb 02, 2012  8:32 AM       chest X-ray ok      Tereso Newcomer, [place]  8:32 AM 02/02/2012

## 2012-02-02 NOTE — Telephone Encounter (Signed)
s/w pt's daughter Corrie Dandy who is aware of cxr results w/verbal understanding today

## 2012-02-24 ENCOUNTER — Telehealth: Payer: Self-pay | Admitting: Internal Medicine

## 2012-02-24 NOTE — Telephone Encounter (Signed)
Pt having dental work and they need to know what kind of anesthesia pt can have, [facility], dr Engineer, production, phone [phone] , pls call pt's dtr and let her know as well

## 2012-02-24 NOTE — Telephone Encounter (Signed)
Cataract sufgery from a cardiac point of view is quite safe

## 2012-02-24 NOTE — Telephone Encounter (Signed)
I called Dr. Mendel Corning office to find out what the patient is having done and what is the preference for anesthesia. Per Dr. Mendel Corning office, the patient is being seen as a new consult and if any work is needed, we should receive a fax with the planned procedure and request for approval for use of anesthesia, therefore, they do not need an answer from cardiology about this now. I have notified the patient's daughter of this. In the process, she was asking me if Dr. Graciela Husbands thought the patient would be ok to undergo a cataract removal with some very mild sedation. This is not planned, but the patient is not seeing well due to the cataract and this may be coming in the near future. I advised I would discuss with Dr. Graciela Husbands and call the patient's daughter back. She is agreeable.

## 2012-02-26 ENCOUNTER — Encounter: Payer: Medicare Other | Admitting: Family Medicine

## 2012-02-26 NOTE — Telephone Encounter (Signed)
The patient's daughter is aware of Dr. Odessa Fleming recommendations.

## 2012-03-01 ENCOUNTER — Telehealth: Payer: Self-pay | Admitting: Internal Medicine

## 2012-03-01 NOTE — Telephone Encounter (Signed)
Last telephone with clearance within the note faxed to the number provided.

## 2012-03-01 NOTE — Telephone Encounter (Signed)
New problem:  Aware that nurse is off today    Status of cardiac clearance

## 2012-03-02 ENCOUNTER — Telehealth: Payer: Self-pay | Admitting: Internal Medicine

## 2012-03-02 NOTE — Telephone Encounter (Signed)
New problem   questionnaire  Was sent  on 1/29.  Need those questions answer pending surgery .

## 2012-03-02 NOTE — Telephone Encounter (Signed)
I left a message for Shanda Bumps that the fax was sent that the patient is ok for oral surgery in the office setting under local anesthesia per Dr. Graciela Husbands.

## 2012-03-05 ENCOUNTER — Ambulatory Visit: Payer: Medicare Other | Admitting: Internal Medicine

## 2012-03-05 ENCOUNTER — Encounter: Payer: Medicare Other | Admitting: Internal Medicine

## 2012-03-10 ENCOUNTER — Encounter: Payer: Self-pay | Admitting: Internal Medicine

## 2012-03-10 ENCOUNTER — Ambulatory Visit (INDEPENDENT_AMBULATORY_CARE_PROVIDER_SITE_OTHER): Payer: Medicare Other | Admitting: Internal Medicine

## 2012-03-10 VITALS — BP 118/56 | HR 71 | Ht 60.0 in | Wt 117.6 lb

## 2012-03-10 DIAGNOSIS — I10 Essential (primary) hypertension: Secondary | ICD-10-CM

## 2012-03-10 DIAGNOSIS — I498 Other specified cardiac arrhythmias: Secondary | ICD-10-CM

## 2012-03-10 DIAGNOSIS — I471 Supraventricular tachycardia, unspecified: Secondary | ICD-10-CM

## 2012-03-10 DIAGNOSIS — I5032 Chronic diastolic (congestive) heart failure: Secondary | ICD-10-CM

## 2012-03-10 DIAGNOSIS — I4891 Unspecified atrial fibrillation: Secondary | ICD-10-CM

## 2012-03-10 DIAGNOSIS — Z950 Presence of cardiac pacemaker: Secondary | ICD-10-CM

## 2012-03-10 LAB — PACEMAKER DEVICE OBSERVATION
AL AMPLITUDE: 2.3 mv
AL THRESHOLD: 0.5 V
BAMS-0001: 150 {beats}/min
RV LEAD AMPLITUDE: 16.6 mv
RV LEAD THRESHOLD: 1 V
VENTRICULAR PACING PM: 0.09

## 2012-03-10 NOTE — Assessment & Plan Note (Signed)
Well controlled 

## 2012-03-10 NOTE — Progress Notes (Signed)
Patient Care Team: Eustaquio Boyden, MD as PCP - General (Family Medicine)   HPI  Rachel Shah is a 79 y.o. female In followup for SVT and atrial fibrillation. She has a history of HFpEF and tachybradycardia syndrome. Status post pacemaker implantation. Thromboembolic risk factors are notable for age-83 hypertension-1 gender-1 heart failure-1 and TIA-2 her CHADS-VASc score 7.  Echo in 5/12 demonstrated moderate LVH, EF 55-60%, mild AI, mild-moderate LAE, lipomatous hypertrophy of the atrial septum She was seen in the ER on Christmas Eve 2013 with SVT treated with adenosine. Diltiazem was increased and metoprolol was added.  Anticoagulation was discontinued after hemoperitoneum 11/13  She is doing relatively well. She has some edema but it is less than it has been. She has some cough but it is also better.  Past Medical History  Diagnosis Date  . Atrial flutter   . Supraventricular tachycardia   . Tachy-brady syndrome     post Medtronic revealed pulse generator dual lead pacemaker  . CHF (congestive heart failure)     a. hx of diastolic CHF;  b. dx with systolic CHF 09/2011 in [place], [place];   c. admx to [facility] 09/2011  in [place], [place] => s/p bilat thoracentesis with neg cytology;   d. Echo 9/13:  mod LAE, mild LVH, EF 30-35%, ant septal severe HK, Gr 2 diast dysfn, mild reduced RVSF, mild MR, Ao sclerosis without AS, mild to mod TR, RVSP 51, mild to mod PR;  e. Echo 11/13: EF 60%  . Angina at rest   . Transient ischemic attack 2007  . Thrombocytopenia     Mild  . Abnormal thyroid function test     Mildly abnormal thyroid function studies with a TSH of 7.984 (upper limit of normal 4.5), free T4 within normal limits at 1.21 and T3  slightly low at 69.2 (lower limit of normal 80), follow up with primary care  . Hypertension   . High cholesterol   . Pacemaker   . Shortness of breath   . History of blood transfusion 12/02/2011  . Lower GI bleeding   . Hard of hearing   . DVT  (deep venous thrombosis) 09/2011    partially occlusive right peroneal vein DVT by [place] at [facility] in [place], [place]  . Osteoporosis     presumed as on actonel and compression deformities noted on CXR  . Nontraumatic hemoperitoneum 11/2011    with hemorrhagic shock from coumadin induced coagulopathy so stopped  . Fibroids     hx    Past Surgical History  Procedure Laterality Date  . Cataract extraction w/ intraocular lens implant  2012    left  . Dilation and curettage of uterus    . Cardiac pacemaker placement  ?02/2010  . Left hip fracture  1987    pins    Current Outpatient Prescriptions  Medication Sig Dispense Refill  . diltiazem (CARDIZEM CD) 240 MG 24 hr capsule Take 1 capsule (240 mg total) by mouth daily.  30 capsule  11  . furosemide (LASIX) 20 MG tablet Take 1 tablet (20 mg total) by mouth daily.  30 tablet  11  . guaiFENesin (MUCINEX) 600 MG 12 hr tablet Take 600 mg by mouth as needed for congestion.      Marland Kitchen loratadine (CLARITIN) 10 MG tablet Take 1 tablet (10 mg total) by mouth daily as needed for allergies.      . metoprolol tartrate (LOPRESSOR) 25 MG tablet Take 1 tablet (25 mg total) by  mouth 2 (two) times daily.  60 tablet  11  . risedronate (ACTONEL) 150 MG tablet Take 1 tablet (150 mg total) by mouth every 30 (thirty) days.  30 tablet  0   No current facility-administered medications for this visit.    Allergies  Allergen Reactions  . Plavix (Clopidogrel Bisulfate)     STOMACH UPSTET  . Coumadin (Warfarin Sodium) Other (See Comments)    Caused internal bleeding    Review of Systems negative except from HPI and PMH  Physical Exam BP 118/56  Pulse 71  Ht 5' (1.524 m)  Wt 117 lb 9.6 oz (53.343 kg)  BMI 22.97 kg/m2 Well developed and well nourished in no acute distress HENT normal E scleral and icterus clear Neck Supple JVP flat; carotids brisk and full Clear to ausculation Regular rate and rhythm, no murmurs gallops or rub Soft with active  bowel sounds No clubbing cyanosis 1+ Edema Alert and oriented, grossly normal motor and sensory function Skin Warm and Dry    Assessment and  Plan

## 2012-03-10 NOTE — Patient Instructions (Signed)
1. Your physician recommends that you schedule a follow-up appointment in:  4 months  2. Continue current medications.

## 2012-03-10 NOTE — Assessment & Plan Note (Signed)
Patient has had some recurrent atrial arrhythmia but relatively little. She is tolerating her medications well. There is some alopecia on beta blockers but not so much that the family wants to change.

## 2012-03-10 NOTE — Assessment & Plan Note (Signed)
Not on anticoagulation because of hemoperitoneum. Rate control seems adequate

## 2012-03-10 NOTE — Assessment & Plan Note (Signed)
Relatively euvolemic and less swollen than in the past. We reviewed  The importance of salt and fluid restriction

## 2012-03-10 NOTE — Assessment & Plan Note (Signed)
The patient's device was interrogated.  The information was reviewed. No changes were made in the programming.   this

## 2012-04-07 ENCOUNTER — Telehealth: Payer: Self-pay | Admitting: Internal Medicine

## 2012-04-07 NOTE — Telephone Encounter (Signed)
New Problem:    Called in needing a surgical clearance sent into [facility] for cataract surgery.  Please call back.

## 2012-04-07 NOTE — Telephone Encounter (Signed)
Will forward to Dr. Graciela Husbands to review for surgical clearance.

## 2012-04-08 NOTE — Telephone Encounter (Signed)
Can have cataract surgery

## 2012-04-13 ENCOUNTER — Encounter: Payer: Self-pay | Admitting: *Deleted

## 2012-04-13 NOTE — Telephone Encounter (Signed)
Clearance letter faxed to [facility] center.

## 2012-04-30 ENCOUNTER — Other Ambulatory Visit: Payer: Self-pay | Admitting: *Deleted

## 2012-04-30 MED ORDER — RISEDRONATE SODIUM 150 MG PO TABS: 150.0000 mg | ORAL_TABLET | ORAL | Status: DC

## 2012-07-09 ENCOUNTER — Ambulatory Visit: Payer: Medicare Other | Admitting: Internal Medicine

## 2012-07-15 ENCOUNTER — Encounter: Payer: Self-pay | Admitting: Internal Medicine

## 2012-07-15 ENCOUNTER — Ambulatory Visit (INDEPENDENT_AMBULATORY_CARE_PROVIDER_SITE_OTHER): Payer: Medicare Other | Admitting: Internal Medicine

## 2012-07-15 VITALS — BP 132/74 | HR 62 | Ht 60.0 in | Wt 118.0 lb

## 2012-07-15 DIAGNOSIS — Z950 Presence of cardiac pacemaker: Secondary | ICD-10-CM

## 2012-07-15 DIAGNOSIS — I5032 Chronic diastolic (congestive) heart failure: Secondary | ICD-10-CM

## 2012-07-15 DIAGNOSIS — I4891 Unspecified atrial fibrillation: Secondary | ICD-10-CM

## 2012-07-15 LAB — PACEMAKER DEVICE OBSERVATION
AL IMPEDENCE PM: 456 Ohm
AL THRESHOLD: 0.5 V
BATTERY VOLTAGE: 3 V
RV LEAD AMPLITUDE: 15.2 mv
RV LEAD IMPEDENCE PM: 616 Ohm
VENTRICULAR PACING PM: 0.09

## 2012-07-15 NOTE — Patient Instructions (Signed)
Your physician wants you to follow-up in: 6 months with Dr. Klein. You will receive a reminder letter in the mail two months in advance. If you don't receive a letter, please call our office to schedule the follow-up appointment.  Your physician recommends that you continue on your current medications as directed. Please refer to the Current Medication list given to you today.  

## 2012-07-15 NOTE — Assessment & Plan Note (Signed)
The patient's device was interrogated.  The information was reviewed. No changes were made in the programming.    

## 2012-07-15 NOTE — Assessment & Plan Note (Signed)
No significant atrial fibrillation detected.

## 2012-07-15 NOTE — Progress Notes (Signed)
Patient Care Team: Eustaquio Boyden, MD as PCP - General (Family Medicine)   HPI  Rachel Shah is a 79 y.o. female Seen in followup for SVT and atrial fibrillation. She has a history of HFpEF and tachybradycardia syndrome w prior  pacemaker implantation. Thromboembolic risk factors are notable for age-71 hypertension-1 gender-1 heart failure-1 and TIA-2 her CHADS-VASc score 7. Anticoagulation was discontinued after hemoperitoneum 11/13   She was seen in the ER on Christmas Eve 2013 with SVT treated with adenosine. Diltiazem was increased and metoprolol was added.   She has had no recurrent SVT. Her biggest challenge is missing [place]      Echo in 5/12 demonstrated moderate LVH, EF 55-60%, mild AI, mild-moderate LAE, lipomatous hypertrophy of the atrial septum        Past Medical History  Diagnosis Date  . Atrial flutter   . Supraventricular tachycardia   . Tachy-brady syndrome     post Medtronic revealed pulse generator dual lead pacemaker  . CHF (congestive heart failure)     a. hx of diastolic CHF;  b. dx with systolic CHF 09/2011 in [place], [place];   c. admx to [facility] 09/2011  in [place], [place] => s/p bilat thoracentesis with neg cytology;   d. Echo 9/13:  mod LAE, mild LVH, EF 30-35%, ant septal severe HK, Gr 2 diast dysfn, mild reduced RVSF, mild MR, Ao sclerosis without AS, mild to mod TR, RVSP 51, mild to mod PR;  e. Echo 11/13: EF 60%  . Angina at rest   . Transient ischemic attack 2007  . Thrombocytopenia     Mild  . Abnormal thyroid function test     Mildly abnormal thyroid function studies with a TSH of 7.984 (upper limit of normal 4.5), free T4 within normal limits at 1.21 and T3  slightly low at 69.2 (lower limit of normal 80), follow up with primary care  . Hypertension   . High cholesterol   . Pacemaker   . Shortness of breath   . History of blood transfusion 12/02/2011  . Lower GI bleeding   . Hard of hearing   . DVT (deep venous thrombosis) 09/2011     partially occlusive right peroneal vein DVT by [place] at [facility] in [place], [place]  . Osteoporosis     presumed as on actonel and compression deformities noted on CXR  . Nontraumatic hemoperitoneum 11/2011    with hemorrhagic shock from coumadin induced coagulopathy so stopped  . Fibroids     hx    Past Surgical History  Procedure Laterality Date  . Cataract extraction w/ intraocular lens implant  2012    left  . Dilation and curettage of uterus    . Cardiac pacemaker placement  ?02/2010  . Left hip fracture  1987    pins    Current Outpatient Prescriptions  Medication Sig Dispense Refill  . diltiazem (CARDIZEM CD) 240 MG 24 hr capsule Take 1 capsule (240 mg total) by mouth daily.  30 capsule  11  . furosemide (LASIX) 20 MG tablet Take 1 tablet (20 mg total) by mouth daily.  30 tablet  11  . guaiFENesin (MUCINEX) 600 MG 12 hr tablet Take 600 mg by mouth as needed for congestion.      Marland Kitchen loratadine (CLARITIN) 10 MG tablet Take 1 tablet (10 mg total) by mouth daily as needed for allergies.      . metoprolol tartrate (LOPRESSOR) 25 MG tablet Take 1 tablet (25 mg total) by  mouth 2 (two) times daily.  60 tablet  11  . risedronate (ACTONEL) 150 MG tablet Take 1 tablet (150 mg total) by mouth every 30 (thirty) days.  12 tablet  0   No current facility-administered medications for this visit.    Allergies  Allergen Reactions  . Plavix (Clopidogrel Bisulfate)     STOMACH UPSTET  . Coumadin (Warfarin Sodium) Other (See Comments)    Caused internal bleeding    Review of Systems negative except from HPI and PMH  Physical Exam BP 132/74  Pulse 62  Ht 5' (1.524 m)  Wt 118 lb (53.524 kg)  BMI 23.05 kg/m2 Well developed and nourished in no acute distress HENT normal Neck supple  Clear Regular rate and rhythm, 3/6 systolic m Abd-soft with active BS No Clubbing cyanosis edema Skin-warm and dry A & Oriented  Grossly normal sensory and motor function Head tremoe      Assessment and  Plan

## 2012-07-15 NOTE — Assessment & Plan Note (Signed)
euvolemic 

## 2012-08-30 ENCOUNTER — Telehealth: Payer: Self-pay

## 2012-08-30 NOTE — Telephone Encounter (Signed)
Agree.  Will seen then.

## 2012-08-30 NOTE — Telephone Encounter (Signed)
Rachel Shah pts daughter said pt was given furosemide by Dr Graciela Husbands; pt now going to Dr Reece Agar and wants to stop fluid pill since her feet are not swelling now. Rachel Shah said pt still has chest congestion and thinks pt needs to continue taking furosemide. Rachel Shah scheduled appt with Dr Reece Agar for f/u appt 09/01/12 at 10:15 am. Rachel Shah said pt would continue taking med until sees Dr Reece Agar on Wed. If pt condition were to change or worsen prior to appt [facility] will cb.Please advise.

## 2012-09-01 ENCOUNTER — Encounter: Payer: Self-pay | Admitting: Family Medicine

## 2012-09-01 ENCOUNTER — Ambulatory Visit (INDEPENDENT_AMBULATORY_CARE_PROVIDER_SITE_OTHER): Payer: Medicare Other | Admitting: Family Medicine

## 2012-09-01 VITALS — BP 140/80 | HR 72 | Temp 97.8°F | Wt 122.2 lb

## 2012-09-01 DIAGNOSIS — I4891 Unspecified atrial fibrillation: Secondary | ICD-10-CM

## 2012-09-01 DIAGNOSIS — I5032 Chronic diastolic (congestive) heart failure: Secondary | ICD-10-CM

## 2012-09-01 DIAGNOSIS — I10 Essential (primary) hypertension: Secondary | ICD-10-CM

## 2012-09-01 DIAGNOSIS — R946 Abnormal results of thyroid function studies: Secondary | ICD-10-CM

## 2012-09-01 DIAGNOSIS — R7989 Other specified abnormal findings of blood chemistry: Secondary | ICD-10-CM

## 2012-09-01 LAB — BASIC METABOLIC PANEL
BUN: 16 mg/dL (ref 6–23)
CO2: 28 mEq/L (ref 19–32)
Calcium: 9.4 mg/dL (ref 8.4–10.5)
Creatinine, Ser: 1 mg/dL (ref 0.4–1.2)
GFR: 57.52 mL/min — ABNORMAL LOW (ref >60.00)
Glucose, Bld: 92 mg/dL (ref 70–99)

## 2012-09-01 LAB — CBC WITH DIFFERENTIAL/PLATELET
Basophils Absolute: 0 10*3/uL (ref 0.0–0.1)
Basophils Relative: 0.4 % (ref 0.0–3.0)
Eosinophils Absolute: 0.2 10*3/uL (ref 0.0–0.7)
Lymphocytes Relative: 16.7 % (ref 12.0–46.0)
MCHC: 33.3 g/dL (ref 30.0–36.0)
MCV: 95.9 fl (ref 78.0–100.0)
Monocytes Absolute: 0.5 10*3/uL (ref 0.1–1.0)
Neutrophils Relative %: 73.7 % (ref 43.0–77.0)
RDW: 15.1 % — ABNORMAL HIGH (ref 11.5–14.6)

## 2012-09-01 LAB — T4, FREE: Free T4: 1.02 ng/dL (ref 0.60–1.60)

## 2012-09-01 MED ORDER — FUROSEMIDE 20 MG PO TABS: 10.0000 mg | ORAL_TABLET | Freq: Every day | ORAL | Status: DC

## 2012-09-01 NOTE — Patient Instructions (Signed)
Ok to take buffered aspirin 81mg  intermittently. Let's continue lasix 10mg  daily (1/2 tablet daily). Blood work today. Good to see you today. Return to see me in 6 months for follow up.

## 2012-09-01 NOTE — Assessment & Plan Note (Signed)
Stable, continue meds 

## 2012-09-01 NOTE — Progress Notes (Signed)
  Subjective:    Patient ID: Rachel Shah, female    DOB: [birth year], 79 y.o.   MRN: [id number]  HPI CC: discuss fluid pill  Presents with daughter today.  Rachel Shah presents today to discuss concerns with lasix.  She has h/o atrial flutter, SVT, tachybrady syndrome, combined s/d CHF, h/o TIA (around 2007), HTN, abnormal thyroid function on latest check, h/o DVT September of 2013, and hemoperitoneum with hemorrhagic shock thought 2/2 coumadin so this was stopped. Lab Results  Component Value Date   TSH 5.413* 12/01/2011    Since Sunday has taken 1/2 pill of lasix daily.  Prior was taking whole 20mg  tablet of lasix daily.  Intermittent diarrhea for last several days.    Denies chest pain/tightness, dyspnea at rest. Leg swelling is much better over last 1 week. Denies increased dyspnea with exertion. Occasional palpitations, but not bothersome.  Continues to miss [place]. Wt Readings from Last 3 Encounters:  09/01/12 122 lb 4 oz (55.452 kg)  07/15/12 118 lb (53.524 kg)  03/10/12 117 lb 9.6 oz (53.343 kg)    Past Medical History  Diagnosis Date  . Atrial flutter   . Supraventricular tachycardia   . Tachy-brady syndrome     post Medtronic revealed pulse generator dual lead pacemaker  . CHF (congestive heart failure)     a. hx of diastolic CHF;  b. dx with systolic CHF 09/2011 in [place], [place];   c. admx to [facility] 09/2011  in [place], [place] => s/p bilat thoracentesis with neg cytology;   d. Echo 9/13:  mod LAE, mild LVH, EF 30-35%, ant septal severe HK, Gr 2 diast dysfn, mild reduced RVSF, mild MR, Ao sclerosis without AS, mild to mod TR, RVSP 51, mild to mod PR;  e. Echo 11/13: EF 60%  . Angina at rest   . Transient ischemic attack 2007  . Thrombocytopenia     Mild  . Abnormal thyroid function test     Mildly abnormal thyroid function studies with a TSH of 7.984 (upper limit of normal 4.5), free T4 within normal limits at 1.21 and T3  slightly low at 69.2 (lower limit of  normal 80), follow up with primary care  . Hypertension   . High cholesterol   . Pacemaker   . Shortness of breath   . History of blood transfusion 12/02/2011  . Lower GI bleeding   . Hard of hearing   . DVT (deep venous thrombosis) 09/2011    partially occlusive right peroneal vein DVT by [place] at [facility] in [place], [place]  . Osteoporosis     presumed as on actonel and compression deformities noted on CXR  . Nontraumatic hemoperitoneum 11/2011    with hemorrhagic shock from coumadin induced coagulopathy so stopped  . Fibroids     hx     Review of Systems Per HPI    Objective:   Physical Exam  Nursing note and vitals reviewed. Constitutional: She appears well-developed and well-nourished. No distress.  HENT:  Mouth/Throat: Oropharynx is clear and moist. No oropharyngeal exudate.  Neck: Normal range of motion. Neck supple.  Cardiovascular: Normal rate, regular rhythm and intact distal pulses.   Murmur (2/6 SEM ) heard. Pulmonary/Chest: Effort normal and breath sounds normal. No respiratory distress. She has no wheezes. She has no rales.  Musculoskeletal: She exhibits no edema.  Skin: Skin is warm and dry. No rash noted.       Assessment & Plan:

## 2012-09-01 NOTE — Assessment & Plan Note (Signed)
Sounds regular today.  ?

## 2012-09-01 NOTE — Assessment & Plan Note (Addendum)
Seems euvolemic today, has been on lasix 10mg  daily. I recommended continue at this dose, but to monitor weights and notify me if upward trend noted. Blood work today to monitor kidney function. H/o abnormal thyroid function - recheck TSH today as well as free T4.

## 2012-12-28 ENCOUNTER — Telehealth: Payer: Self-pay | Admitting: Internal Medicine

## 2012-12-28 NOTE — Telephone Encounter (Signed)
New message    Daughter want mom to see Dr Graciela Husbands tomorrow. C/o feet swelling, coughing up lots of mucous, and diarrhea.  Pls call and advise.

## 2012-12-28 NOTE — Telephone Encounter (Signed)
Patient's daughter states mother is complaining of SOB, swelling in feet, non-compliant taking her fluid medication, inflammation, and coughing up mucous. She states that patient has not had weight gain, only pedal edema. I explained that Dr. Graciela Husbands is not in the office tomorrow and there were no appointment's available with PA/NP. I advised patient to see PCP in the morning and call us back if they could not get in to see a doctor tomorrow. Patient's daughter agreeable to plan.

## 2012-12-30 ENCOUNTER — Ambulatory Visit (INDEPENDENT_AMBULATORY_CARE_PROVIDER_SITE_OTHER): Payer: Medicare Other | Admitting: Family Medicine

## 2012-12-30 ENCOUNTER — Encounter: Payer: Self-pay | Admitting: Family Medicine

## 2012-12-30 VITALS — BP 128/64 | HR 72 | Temp 98.2°F | Wt 126.2 lb

## 2012-12-30 DIAGNOSIS — R079 Chest pain, unspecified: Secondary | ICD-10-CM

## 2012-12-30 DIAGNOSIS — J3489 Other specified disorders of nose and nasal sinuses: Secondary | ICD-10-CM

## 2012-12-30 DIAGNOSIS — I5032 Chronic diastolic (congestive) heart failure: Secondary | ICD-10-CM

## 2012-12-30 NOTE — Assessment & Plan Note (Signed)
Again seems overall euvolemic today on exam and reports compliance with lasix 10mg  daily. However, 4lb weight gain noted over last few months as well as noted increased right foot swelling. I suggest she take lasix 20mg  daily for 2 days then return to prior 10mg  dose - pt very hesitant to increase lasix dose. I asked patient and daughter to update [place] if symptoms recur or persist.

## 2012-12-30 NOTE — Assessment & Plan Note (Signed)
?  GERD related given described burning pain.  Pt states not concerned anymore as this has resolved over the last few days, but I asked her to hold actonel if she has any recurrent symptoms. Consider completely d/c actonel in the future - has been on this at least last 2 years.

## 2012-12-30 NOTE — Progress Notes (Signed)
Pre-visit discussion using our clinic review tool. No additional management support is needed unless otherwise documented below in the visit note.  

## 2012-12-30 NOTE — Patient Instructions (Addendum)
Let's watch abdominal discomfort and trouble swallowing - if this returns, I want you to hold actonel. I want you to use immediate release guaifenesin with water to help with congestion as needed. I want you to increase lasix to one pill daily for 2 days then may back down to 1/2 tablet daily. Push small sips of fluid throughout the day.

## 2012-12-30 NOTE — Assessment & Plan Note (Signed)
I encouraged her to use immediate release guaifenesin with fluids to help clear congestion.

## 2012-12-30 NOTE — Progress Notes (Signed)
   Subjective:    Patient ID: Rachel Shah, female    DOB: [birth year], 79 y.o.   MRN: [id number]  HPI CC: discuss lasix dosing  She has h/o atrial flutter, SVT, tachybrady syndrome, combined s/d CHF, h/o TIA (around 2007), HTN, abnormal thyroid function on latest check, h/o DVT September of 2013, and hemoperitoneum with hemorrhagic shock thought 2/2 coumadin   She has been on 10mg  lasix for last several months. She recently had some chest and abdominal pain.  This has resolved. Endorses low salt diet.  H/o osteoporosis - on actonel for several years.    Daughter worried because swelling caused   Wt Readings from Last 3 Encounters:  12/30/12 126 lb 4 oz (57.267 kg)  09/01/12 122 lb 4 oz (55.452 kg)  07/15/12 118 lb (53.524 kg)  denies change in swelling, denies dyspnea, denies cough.  Past Medical History  Diagnosis Date  . Atrial flutter   . Supraventricular tachycardia   . Tachy-brady syndrome     post Medtronic revealed pulse generator dual lead pacemaker  . CHF (congestive heart failure)     a. hx of diastolic CHF;  b. dx with systolic CHF 09/2011 in [place], [place];   c. admx to [facility] 09/2011  in [place], [place] => s/p bilat thoracentesis with neg cytology;   d. Echo 9/13:  mod LAE, mild LVH, EF 30-35%, ant septal severe HK, Gr 2 diast dysfn, mild reduced RVSF, mild MR, Ao sclerosis without AS, mild to mod TR, RVSP 51, mild to mod PR;  e. Echo 11/13: EF 60%  . Angina at rest   . Transient ischemic attack 2007  . Thrombocytopenia     Mild  . Abnormal thyroid function test     Mildly abnormal thyroid function studies with a TSH of 7.984 (upper limit of normal 4.5), free T4 within normal limits at 1.21 and T3  slightly low at 69.2 (lower limit of normal 80), follow up with primary care  . Hypertension   . High cholesterol   . Pacemaker   . Shortness of breath   . History of blood transfusion 12/02/2011  . Lower GI bleeding   . Hard of hearing   . DVT (deep venous  thrombosis) 09/2011    partially occlusive right peroneal vein DVT by [place] at [facility] in [place], [place]  . Osteoporosis     presumed as on actonel and compression deformities noted on CXR  . Nontraumatic hemoperitoneum 11/2011    with hemorrhagic shock from coumadin induced coagulopathy so stopped  . Fibroids     hx  . CVA (cerebral infarction) 09/2011    evidence found on latest eye exam with R hemianopsia per ophtho     Review of Systems Per HPI    Objective:   Physical Exam  Nursing note and vitals reviewed. Constitutional: She appears well-developed and well-nourished. No distress.  HENT:  Mouth/Throat: Oropharynx is clear and moist. No oropharyngeal exudate.  Cardiovascular: Normal rate, regular rhythm, normal heart sounds and intact distal pulses.   No murmur heard. Pulmonary/Chest: Effort normal and breath sounds normal. No respiratory distress. She has no wheezes. She has no rales.  No crackles  Musculoskeletal: She exhibits edema.  Edema R>L dorsal foot       Assessment & Plan:

## 2013-01-03 ENCOUNTER — Other Ambulatory Visit: Payer: Self-pay | Admitting: Physician Assistant

## 2013-01-03 ENCOUNTER — Other Ambulatory Visit (HOSPITAL_COMMUNITY): Payer: Self-pay | Admitting: Physician Assistant

## 2013-01-04 ENCOUNTER — Other Ambulatory Visit: Payer: Self-pay | Admitting: Family Medicine

## 2013-01-23 ENCOUNTER — Other Ambulatory Visit (HOSPITAL_COMMUNITY): Payer: Self-pay | Admitting: Physician Assistant

## 2013-01-31 ENCOUNTER — Ambulatory Visit (INDEPENDENT_AMBULATORY_CARE_PROVIDER_SITE_OTHER): Payer: Medicare Other | Admitting: *Deleted

## 2013-01-31 ENCOUNTER — Other Ambulatory Visit: Payer: Self-pay | Admitting: Family Medicine

## 2013-01-31 DIAGNOSIS — I4891 Unspecified atrial fibrillation: Secondary | ICD-10-CM

## 2013-01-31 LAB — MDC_IDC_ENUM_SESS_TYPE_INCLINIC
Brady Statistic AP VP Percent: 0.05 %
Brady Statistic AP VS Percent: 93.99 %
Brady Statistic AS VS Percent: 5.94 %
Brady Statistic RA Percent Paced: 94.04 %
Brady Statistic RV Percent Paced: 0.07 %
Lead Channel Impedance Value: 416 Ohm
Lead Channel Impedance Value: 576 Ohm
Lead Channel Pacing Threshold Pulse Width: 0.4 ms
Lead Channel Pacing Threshold Pulse Width: 0.4 ms
Lead Channel Sensing Intrinsic Amplitude: 2.6733
Lead Channel Setting Pacing Amplitude: 2 V
Lead Channel Setting Pacing Pulse Width: 0.4 ms
Lead Channel Setting Sensing Sensitivity: 0.9 mV
MDC IDC MSMT BATTERY VOLTAGE: 3 V
MDC IDC MSMT LEADCHNL RA PACING THRESHOLD AMPLITUDE: 1 V
MDC IDC MSMT LEADCHNL RV PACING THRESHOLD AMPLITUDE: 1 V
MDC IDC MSMT LEADCHNL RV SENSING INTR AMPL: 15.8682
MDC IDC SESS DTM: 20150105145217
MDC IDC SET LEADCHNL RV PACING AMPLITUDE: 2.5 V
MDC IDC STAT BRADY AS VP PERCENT: 0.02 %
Zone Setting Detection Interval: 400 ms
Zone Setting Detection Interval: 400 ms

## 2013-01-31 NOTE — Progress Notes (Signed)
PPM check in office. 

## 2013-02-27 ENCOUNTER — Other Ambulatory Visit: Payer: Self-pay | Admitting: Internal Medicine

## 2013-03-01 ENCOUNTER — Encounter: Payer: Self-pay | Admitting: Internal Medicine

## 2013-03-27 ENCOUNTER — Other Ambulatory Visit (HOSPITAL_COMMUNITY): Payer: Self-pay | Admitting: Internal Medicine

## 2013-04-21 ENCOUNTER — Ambulatory Visit (INDEPENDENT_AMBULATORY_CARE_PROVIDER_SITE_OTHER): Payer: Medicare Other | Admitting: Family Medicine

## 2013-04-21 ENCOUNTER — Other Ambulatory Visit (HOSPITAL_COMMUNITY): Payer: Self-pay | Admitting: Internal Medicine

## 2013-04-21 ENCOUNTER — Encounter: Payer: Self-pay | Admitting: Family Medicine

## 2013-04-21 VITALS — BP 130/70 | HR 71 | Temp 97.3°F | Wt 131.5 lb

## 2013-04-21 DIAGNOSIS — M25552 Pain in left hip: Secondary | ICD-10-CM

## 2013-04-21 DIAGNOSIS — M25559 Pain in unspecified hip: Secondary | ICD-10-CM

## 2013-04-21 DIAGNOSIS — M810 Age-related osteoporosis without current pathological fracture: Secondary | ICD-10-CM

## 2013-04-21 DIAGNOSIS — I10 Essential (primary) hypertension: Secondary | ICD-10-CM

## 2013-04-21 DIAGNOSIS — I5032 Chronic diastolic (congestive) heart failure: Secondary | ICD-10-CM

## 2013-04-21 DIAGNOSIS — R259 Unspecified abnormal involuntary movements: Secondary | ICD-10-CM

## 2013-04-21 DIAGNOSIS — R251 Tremor, unspecified: Secondary | ICD-10-CM

## 2013-04-21 DIAGNOSIS — J3489 Other specified disorders of nose and nasal sinuses: Secondary | ICD-10-CM

## 2013-04-21 MED ORDER — GABAPENTIN 100 MG PO CAPS: 100.0000 mg | ORAL_CAPSULE | Freq: Three times a day (TID) | ORAL | Status: DC

## 2013-04-21 MED ORDER — FUROSEMIDE 20 MG PO TABS: ORAL_TABLET | ORAL | Status: DC

## 2013-04-21 NOTE — Assessment & Plan Note (Signed)
Chronic, stable. Encouraged compliance with meds.  Pt was worried metoprolol 25mg  bid wa too strong, reassured her this is not the case.

## 2013-04-21 NOTE — Progress Notes (Signed)
Pre visit review using our clinic review tool, if applicable. No additional management support is needed unless otherwise documented below in the visit note. 

## 2013-04-21 NOTE — Patient Instructions (Addendum)
Start calcium plus vitamin D supplementation daily for bone strength. For hips, may use tylenol 500mg  as needed for hip pain. For sinus drainage - try claritin daily for congestion. For tremors - may try gabapentin 100mg  nightly - then slowly increase to twice daily and may go up to three times daily (but it may make you sleepy). Blood work today to check kidneys. Return to see me in 2 months for follow up. Good to see you today.

## 2013-04-21 NOTE — Assessment & Plan Note (Signed)
Head tremor + anticipated benign essential tremor. Will do trial of gabapentin - start at 100mg  daily, then slowly increase to TID PRN Discussed possible dizziness side effect.

## 2013-04-21 NOTE — Assessment & Plan Note (Signed)
Avoid oral bisphosphonate 2/2 h/o GI bleed. Advised she start calcium/vit D once daily.

## 2013-04-21 NOTE — Assessment & Plan Note (Signed)
H/o remote hip fracture.. Overall normal exam today.  Monitor for now, encouraged tylenol use PRN pain.

## 2013-04-21 NOTE — Progress Notes (Signed)
BP 130/70  Pulse 71  Temp(Src) 97.3 F (36.3 C) (Oral)  Wt 131 lb 8 oz (59.648 kg)  SpO2 96%   CC: several issues   Subjective:    Patient ID: Rachel Shah, female    DOB: 02-Apr-1910, 80 y.o.   MRN: [id number]  HPI: Rachel Shah is a 80 y.o. female presenting on 04/21/2013 for Nasal Congestion, Tremors, Medication Management, Edema and Bleeding/Bruising   Presents with daughter with several concerns today  Some bruising on skin, unsure to what.  Denies falls or bumping into anything.    Some swelling of L ring finger.    Some swelling of legs.  Pt does not like to take lasix because it increases urination frequency.  Some L hip pain - h/o fracture 1987.  She has been walking more recently.  Asks about medicine for tremors.  Trouble reading at times 2/2 head tremor.  Significantly enjoys reading.  requests blood work today.  Relevant past medical, surgical, family and social history reviewed and updated as indicated.  Allergies and medications reviewed and updated. No current outpatient prescriptions on file prior to visit.   No current facility-administered medications on file prior to visit.    Review of Systems Per HPI unless specifically indicated above    Objective:    BP 130/70  Pulse 71  Temp(Src) 97.3 F (36.3 C) (Oral)  Wt 131 lb 8 oz (59.648 kg)  SpO2 96%  Physical Exam  Nursing note and vitals reviewed. Constitutional: She appears well-developed and well-nourished. No distress.  HENT:  Head: Normocephalic and atraumatic.  Right Ear: Decreased hearing is noted.  Left Ear: Decreased hearing is noted.  Mouth/Throat: Oropharynx is clear and moist. No oropharyngeal exudate.  Hearing aides in place  Eyes: Conjunctivae and EOM are normal. Pupils are equal, round, and reactive to light.  Cardiovascular: Normal rate, regular rhythm and intact distal pulses.   Murmur (3/6 SEM) heard. Pulmonary/Chest: Effort normal and breath sounds normal. No  respiratory distress. She has no wheezes. She has no rales.  Musculoskeletal: She exhibits edema (1+ pedal edema from feet to ankles, minimal otherwise).  No pain at [place] or SIJ or sciatic notch bilaterally. No significant pain with int/ext rotation at hip R ring PIP with some swelling but no erythema/warmth or tenderness  Neurological:  Marked head tremor present at rest, less noticeable action tremor present  Skin: Skin is warm and dry. No rash noted.  Psychiatric: She has a normal mood and affect.       Assessment & Plan:   Problem List Items Addressed This Visit   Chronic diastolic heart failure     Stable pedal edema today without evidence of hypervolemia.  Encouraged regular lasix use at 1 pill daily. Check Cr today.    Relevant Medications      furosemide (LASIX) 20 MG tablet   HTN (hypertension) - Primary     Chronic, stable. Encouraged compliance with meds.  Pt was worried metoprolol 25mg  bid wa too strong, reassured her this is not the case.    Relevant Orders      Basic metabolic panel   Left hip pain     H/o remote hip fracture.. Overall normal exam today.  Monitor for now, encouraged tylenol use PRN pain.    Osteoporosis     Avoid oral bisphosphonate 2/2 h/o GI bleed. Advised she start calcium/vit D once daily.    Relevant Medications      Calcium Carb-Cholecalciferol (CALCIUM-VITAMIN D) 600-400  MG-UNIT TABS   Sinus drainage     Has not been compliant with claritin.  Encouraged her to restart.this.    Tremor     Head tremor + anticipated benign essential tremor. Will do trial of gabapentin - start at 100mg  daily, then slowly increase to TID PRN Discussed possible dizziness side effect.        Follow up plan: Return in about 2 months (around 06/21/2013), or as needed, for follow up visit.

## 2013-04-21 NOTE — Assessment & Plan Note (Signed)
Has not been compliant with claritin.  Encouraged her to restart.this.

## 2013-04-21 NOTE — Assessment & Plan Note (Signed)
Stable pedal edema today without evidence of hypervolemia.  Encouraged regular lasix use at 1 pill daily. Check Cr today.

## 2013-04-22 LAB — BASIC METABOLIC PANEL
BUN: 24 mg/dL — ABNORMAL HIGH (ref 6–23)
CHLORIDE: 104 meq/L (ref 96–112)
CO2: 25 mEq/L (ref 19–32)
CREATININE: 1.1 mg/dL (ref 0.4–1.2)
Calcium: 9.3 mg/dL (ref 8.4–10.5)
GFR: 49.02 mL/min — ABNORMAL LOW (ref >60.00)
Glucose, Bld: 76 mg/dL (ref 70–99)
POTASSIUM: 4.3 meq/L (ref 3.5–5.1)
Sodium: 139 mEq/L (ref 135–145)

## 2013-04-26 ENCOUNTER — Encounter: Payer: Self-pay | Admitting: *Deleted

## 2013-04-28 ENCOUNTER — Telehealth: Payer: Self-pay | Admitting: *Deleted

## 2013-04-28 NOTE — Telephone Encounter (Signed)
Patient's daughter Stanton Kidney called for pt's results.

## 2013-05-04 ENCOUNTER — Encounter: Payer: Medicare Other | Admitting: *Deleted

## 2013-05-12 ENCOUNTER — Encounter: Payer: Self-pay | Admitting: *Deleted

## 2013-05-20 ENCOUNTER — Other Ambulatory Visit (HOSPITAL_COMMUNITY): Payer: Self-pay | Admitting: Internal Medicine

## 2013-06-27 ENCOUNTER — Other Ambulatory Visit (HOSPITAL_COMMUNITY): Payer: Self-pay | Admitting: Internal Medicine

## 2013-06-29 ENCOUNTER — Encounter: Payer: Self-pay | Admitting: *Deleted

## 2013-06-29 ENCOUNTER — Encounter: Payer: Medicare Other | Admitting: Internal Medicine

## 2013-06-29 NOTE — Progress Notes (Deleted)
Patient Care Team: [name], MD as PCP - General (Family Medicine)   HPI  Rachel Shah is a 80 y.o. female In followup for SVT and atrial fibrillation. She has a history of HFpEF and tachybradycardia syndrome. Status post pacemaker implantation. Thromboembolic risk factors are notable for age-30 hypertension-1 gender-1 heart failure-1 and TIA-2 her CHADS-VASc score 7.  Echo in 5/12 demonstrated moderate LVH, EF 55-60%, mild AI, mild-moderate LAE, lipomatous hypertrophy of the atrial septum  She was seen in the ER on Christmas Eve 2013 with SVT treated with adenosine. Diltiazem was increased and metoprolol was added.  Anticoagulation was discontinued after hemoperitoneum 11/13  She is doing relatively well. She has some edema but it is less than it has been. She has some cough but it is also better.   Past Medical History  Diagnosis Date  . Atrial flutter   . Supraventricular tachycardia   . Tachy-brady syndrome     post Medtronic revealed pulse generator dual lead pacemaker  . CHF (congestive heart failure)     a. hx of diastolic CHF;  b. dx with systolic CHF 03/2200 in [place], [place];   c. admx to [facility] 09/2011  in [place], [place] => s/p bilat thoracentesis with neg cytology;   d. Echo 9/13:  mod LAE, mild LVH, EF 30-35%, ant septal severe HK, Gr 2 diast dysfn, mild reduced RVSF, mild MR, Ao sclerosis without AS, mild to mod TR, RVSP 51, mild to mod PR;  e. Echo 11/13: EF 60%  . Angina at rest   . Transient ischemic attack 2007  . Thrombocytopenia     Mild  . Abnormal thyroid function test     Mildly abnormal thyroid function studies with a TSH of 7.984 (upper limit of normal 4.5), free T4 within normal limits at 1.21 and T3  slightly low at 69.2 (lower limit of normal 80), follow up with primary care  . Hypertension   . High cholesterol   . Pacemaker   . Shortness of breath   . History of blood transfusion 12/02/2011  . Lower GI bleeding   . Hard of hearing     . DVT (deep venous thrombosis) 09/2011    partially occlusive right peroneal vein DVT by [place] at [facility] in [place], [place]  . Osteoporosis     presumed as on actonel and compression deformities noted on CXR  . Nontraumatic hemoperitoneum 11/2011    with hemorrhagic shock from coumadin induced coagulopathy so stopped  . Fibroids     hx  . CVA (cerebral infarction) 09/2011    evidence found on latest eye exam with R hemianopsia per ophtho    Past Surgical History  Procedure Laterality Date  . Cataract extraction w/ intraocular lens implant  2012    left  . Dilation and curettage of uterus    . Cardiac pacemaker placement  ?02/2010  . Left hip fracture  1987    pins  . Cataract extraction Bilateral     Current Outpatient Prescriptions  Medication Sig Dispense Refill  . acetaminophen (TYLENOL) 500 MG tablet Take 500 mg by mouth 2 (two) times daily as needed for moderate pain.      . Calcium Carb-Cholecalciferol (CALCIUM-VITAMIN D) 600-400 MG-UNIT TABS Take 1 tablet by mouth daily. For bone stregth      . diltiazem (CARDIZEM CD) 240 MG 24 hr capsule TAKE 1 CAPSULE (240 MG TOTAL) BY MOUTH DAILY.  30 capsule  0  .  furosemide (LASIX) 20 MG tablet 1 tablet daily for leg swelling      . gabapentin (NEURONTIN) 100 MG capsule Take 100 mg by mouth 3 (three) times daily. tremors      . loratadine (CLARITIN) 10 MG tablet Take 10 mg by mouth daily as needed for allergies. For congestion      . metoprolol tartrate (LOPRESSOR) 25 MG tablet TAKE 1 TABLET (25 MG TOTAL) BY MOUTH 2 (TWO) TIMES DAILY.  60 tablet  0   No current facility-administered medications for this visit.    Allergies  Allergen Reactions  . Plavix [Clopidogrel Bisulfate]     STOMACH UPSTET  . Coumadin [Warfarin Sodium] Other (See Comments)    Caused internal bleeding    Review of Systems negative except from HPI and PMH  Physical Exam There were no vitals taken for this visit. Well developed and well nourished in  no acute distress HENT normal E scleral and icterus clear Neck Supple JVP flat; carotids brisk and full Clear to ausculation {CARD RHYTHM:10874} ***Regular rate and rhythm, no murmurs gallops or rub Soft with active bowel sounds No clubbing cyanosis {Numbers; edema:17696} Edema Alert and oriented, grossly normal motor and sensory function Skin Warm and Dry    Assessment and  Plan

## 2013-07-01 ENCOUNTER — Encounter: Payer: Self-pay | Admitting: Cardiology

## 2013-07-19 ENCOUNTER — Encounter: Payer: Medicare Other | Admitting: Internal Medicine

## 2013-07-28 ENCOUNTER — Other Ambulatory Visit (HOSPITAL_COMMUNITY): Payer: Self-pay | Admitting: Internal Medicine

## 2013-08-02 ENCOUNTER — Encounter: Payer: Self-pay | Admitting: Internal Medicine

## 2013-08-02 ENCOUNTER — Ambulatory Visit (INDEPENDENT_AMBULATORY_CARE_PROVIDER_SITE_OTHER): Payer: Medicare Other | Admitting: Internal Medicine

## 2013-08-02 VITALS — BP 124/64 | HR 62 | Ht 60.0 in | Wt 130.5 lb

## 2013-08-02 DIAGNOSIS — I4891 Unspecified atrial fibrillation: Secondary | ICD-10-CM

## 2013-08-02 DIAGNOSIS — I471 Supraventricular tachycardia: Secondary | ICD-10-CM

## 2013-08-02 DIAGNOSIS — I498 Other specified cardiac arrhythmias: Secondary | ICD-10-CM

## 2013-08-02 LAB — MDC_IDC_ENUM_SESS_TYPE_INCLINIC
Battery Voltage: 3 V
Brady Statistic AP VP Percent: 0.08 %
Brady Statistic RA Percent Paced: 90.83 %
Brady Statistic RV Percent Paced: 0.11 %
Lead Channel Impedance Value: 472 Ohm
Lead Channel Pacing Threshold Pulse Width: 0.4 ms
Lead Channel Sensing Intrinsic Amplitude: 1.7968
Lead Channel Setting Pacing Amplitude: 2 V
Lead Channel Setting Pacing Pulse Width: 0.4 ms
Lead Channel Setting Sensing Sensitivity: 0.9 mV
MDC IDC MSMT LEADCHNL RA PACING THRESHOLD AMPLITUDE: 0.5 V
MDC IDC MSMT LEADCHNL RA PACING THRESHOLD PULSEWIDTH: 0.4 ms
MDC IDC MSMT LEADCHNL RV IMPEDANCE VALUE: 592 Ohm
MDC IDC MSMT LEADCHNL RV PACING THRESHOLD AMPLITUDE: 0.5 V
MDC IDC MSMT LEADCHNL RV SENSING INTR AMPL: 16.903 mV
MDC IDC SESS DTM: 20150707120759
MDC IDC SET LEADCHNL RV PACING AMPLITUDE: 2.5 V
MDC IDC STAT BRADY AP VS PERCENT: 90.75 %
MDC IDC STAT BRADY AS VP PERCENT: 0.03 %
MDC IDC STAT BRADY AS VS PERCENT: 9.14 %
Zone Setting Detection Interval: 400 ms
Zone Setting Detection Interval: 400 ms

## 2013-08-02 NOTE — Progress Notes (Signed)
Patient Care Team: [name], MD as PCP - General (Family Medicine)   HPI  Rachel Shah is a 80 y.o. female In followup for SVT and atrial fibrillation. She has a history of HFpEF and tachybradycardia syndrome. Status post pacemaker implantation. Thromboembolic risk factors are notable for age-66 hypertension-1 gender-1 heart failure-1 and TIA-2 her CHADS-VASc score 7.   Echo in 5/12 demonstrated moderate LVH, EF 55-60%, mild AI, mild-moderate LAE, lipomatous hypertrophy of the atrial septum   She has had no recurrent supraventricular tachycardia  Anticoagulation was discontinued after hemoperitoneum 11/13   She is doing relatively well. She continues to have problems with peripheral edema; however, when it finally went away last winter her shoes did not fit and that was a bigger problem.  Past Medical History  Diagnosis Date  . Atrial fibrillation   . Supraventricular tachycardia   . Tachy-brady syndrome     post Medtronic revealed pulse generator dual lead pacemaker  . Chronic diastolic heart failure     a. hx of diastolic CHF;  b. dx with systolic CHF 06/1605 in [place], [place];   c. admx to [facility] 09/2011  in [place], [place] => s/p bilat thoracentesis with neg cytology;   d. Echo 9/13:  mod LAE, mild LVH, EF 30-35%, ant septal severe HK, Gr 2 diast dysfn, mild reduced RVSF, mild MR, Ao sclerosis without AS, mild to mod TR, RVSP 51, mild to mod PR;  e. Echo 11/13: EF 60%  . Angina at rest   . Transient ischemic attack 2007  . Thrombocytopenia     Mild  . Abnormal thyroid function test     Mildly abnormal thyroid function studies with a TSH of 7.984 (upper limit of normal 4.5), free T4 within normal limits at 1.21 and T3  slightly low at 69.2 (lower limit of normal 80), follow up with primary care  . Hypertension   . High cholesterol   . Pacemaker -Medtronic   . Shortness of breath   . History of blood transfusion 12/02/2011  . Lower GI bleeding   . Hard of  hearing   . DVT (deep venous thrombosis) 09/2011    partially occlusive right peroneal vein DVT by [place] at [facility] in [place], [place]  . Osteoporosis     presumed as on actonel and compression deformities noted on CXR  . Nontraumatic hemoperitoneum 11/2011    with hemorrhagic shock from coumadin induced coagulopathy so stopped  . Fibroids     hx  . CVA (cerebral infarction) 09/2011    evidence found on latest eye exam with R hemianopsia per ophtho    Past Surgical History  Procedure Laterality Date  . Cataract extraction w/ intraocular lens implant  2012    left  . Dilation and curettage of uterus    . Cardiac pacemaker placement  ?02/2010  . Left hip fracture  1987    pins  . Cataract extraction Bilateral     Current Outpatient Prescriptions  Medication Sig Dispense Refill  . acetaminophen (TYLENOL) 500 MG tablet Take 500 mg by mouth 2 (two) times daily as needed for moderate pain.      Marland Kitchen diltiazem (CARDIZEM CD) 240 MG 24 hr capsule TAKE 1 CAPSULE (240 MG TOTAL) BY MOUTH DAILY.  30 capsule  3  . furosemide (LASIX) 20 MG tablet Take 10 mg by mouth daily. 1 tablet daily for leg swelling      . loratadine (CLARITIN) 10 MG tablet Take  10 mg by mouth daily as needed for allergies. For congestion      . metoprolol tartrate (LOPRESSOR) 25 MG tablet TAKE 1 TABLET (25 MG TOTAL) BY MOUTH 2 (TWO) TIMES DAILY.  60 tablet  3   No current facility-administered medications for this visit.    Allergies  Allergen Reactions  . Plavix [Clopidogrel Bisulfate]     STOMACH UPSTET  . Coumadin [Warfarin Sodium] Other (See Comments)    Caused internal bleeding    Review of Systems negative except from HPI and PMH  Physical Exam BP 124/64  Pulse 62  Ht 5' (1.524 m)  Wt 130 lb 8 oz (59.194 kg)  BMI 25.49 kg/m2 Well developed and well nourished in no acute distress HENT normal E scleral and icterus clear Neck Supple JVP flat; carotids brisk and full Clear to ausculation Device pocket  well healed; without hematoma or erythema.  There is no tethering  Regular rate and rhythm, no murmurs gallops or rub Soft with active bowel sounds No clubbing cyanosis 3+ Edema Alert and oriented, grossly normal motor and sensory function Skin Warm and Dry  ECG demonstrates atrial pacing at 62 Intervals 27/12/43 Axis leftward at -56  Assessment and  Plan  Atrial fibrillation-paroxysmal  HFpEF  Pacemaker-Medtronic  SVT  There has been no intercurrent SVT. His had no significant atrial fibrillation. She is not on anticoagulation because of her prior hemoperitoneum  She has moderate peripheral edema. We spent a fair amount of time discussing the role of diuretics. At this time she is not inclined to change her diuretics. It is more important that her shoes fit.

## 2013-08-02 NOTE — Patient Instructions (Signed)
Remote monitoring is used to monitor your Pacemaker of ICD from home. This monitoring reduces the number of office visits required to check your device to one time per year. It allows [place] to keep an eye on the functioning of your device to ensure it is working properly. You are scheduled for a device check from home on 11/07/13. You may send your transmission at any time that day. If you have a wireless device, the transmission will be sent automatically. After your physician reviews your transmission, you will receive a postcard with your next transmission date.  Your physician wants you to follow-up in: 6 months with device clinic. You will receive a reminder letter in the mail two months in advance. If you don't receive a letter, please call our office to schedule the follow-up appointment.  Your physician wants you to follow-up in: Dr. Caryl Comes in 1 year. You will receive a reminder letter in the mail two months in advance. If you don't receive a letter, please call our office to schedule the follow-up appointment.

## 2013-09-05 ENCOUNTER — Other Ambulatory Visit: Payer: Self-pay | Admitting: Family Medicine

## 2013-09-27 ENCOUNTER — Ambulatory Visit: Payer: Medicare Other | Admitting: Family Medicine

## 2013-09-30 ENCOUNTER — Ambulatory Visit (INDEPENDENT_AMBULATORY_CARE_PROVIDER_SITE_OTHER): Payer: Medicare Other | Admitting: Family Medicine

## 2013-09-30 ENCOUNTER — Ambulatory Visit (INDEPENDENT_AMBULATORY_CARE_PROVIDER_SITE_OTHER)
Admission: RE | Admit: 2013-09-30 | Discharge: 2013-09-30 | Disposition: A | Discharge status: Home / Self Care | Payer: Medicare Other | Source: Ambulatory Visit | Attending: Family Medicine | Admitting: Family Medicine

## 2013-09-30 ENCOUNTER — Encounter: Payer: Self-pay | Admitting: Family Medicine

## 2013-09-30 VITALS — BP 146/74 | HR 68 | Temp 97.9°F | Wt 134.5 lb

## 2013-09-30 DIAGNOSIS — M810 Age-related osteoporosis without current pathological fracture: Secondary | ICD-10-CM

## 2013-09-30 DIAGNOSIS — M25559 Pain in unspecified hip: Secondary | ICD-10-CM

## 2013-09-30 DIAGNOSIS — M25552 Pain in left hip: Secondary | ICD-10-CM

## 2013-09-30 NOTE — Patient Instructions (Signed)
I'm suspicious for arthritis of hip and knee on left. Let's check xray of left hip. May use tylenol for pain as needed. Start calcium and vitamin D 600/400mg  1-2 pills daily - look into chewable tablets. Let us know if not improving as expected.

## 2013-09-30 NOTE — Assessment & Plan Note (Signed)
Anticipate sciatica type pain - given bisphosphonate use, longstanding nature of pain and h/o hip replacement, will check L hip xrays  Discussed tylenol prn pain. Will call with results of xray.

## 2013-09-30 NOTE — Assessment & Plan Note (Signed)
Off bisphosphonate. Will continue to avoid 2/2 h/o GI bleed and she was on these medications for 10-15 yrs per daughter. Advised start calcium/vit D chewable once daily.

## 2013-09-30 NOTE — Progress Notes (Signed)
Pre visit review using our clinic review tool, if applicable. No additional management support is needed unless otherwise documented below in the visit note. 

## 2013-09-30 NOTE — Progress Notes (Signed)
   BP 146/74  Pulse 68  Temp(Src) 97.9 F (36.6 C) (Oral)  Wt 134 lb 8 oz (61.009 kg)   CC: L hip pain  Subjective:    Patient ID: Rachel Shah, female    DOB: 03/25/1910, 80 y.o.   MRN: [id number]  HPI: Rachel Shah is a 80 y.o. female presenting on 09/30/2013 for Hip Pain   Some L hip pain - h/o fracture 1987. She has been walking more recently. Not to the point of needing tylenol. Points to lateral hip and occasional radiation down to anterior knee. No back pain. Some trouble controlling bowel/bladder. No fevers. Denies inciting trauma/injury.  H/o bisphosphonate use in past. Osteoporosis presumed as on actonel and compression deformities noted on CXR 01/2012  Relevant past medical, surgical, family and social history reviewed and updated as indicated.  Allergies and medications reviewed and updated. Current Outpatient Prescriptions on File Prior to Visit  Medication Sig  . acetaminophen (TYLENOL) 500 MG tablet Take 500 mg by mouth 2 (two) times daily as needed for moderate pain.  Marland Kitchen diltiazem (CARDIZEM CD) 240 MG 24 hr capsule TAKE 1 CAPSULE (240 MG TOTAL) BY MOUTH DAILY.  . furosemide (LASIX) 20 MG tablet TAKE 1 TABLET BY MOUTH EVERY DAY  . loratadine (CLARITIN) 10 MG tablet Take 10 mg by mouth daily as needed for allergies. For congestion  . metoprolol tartrate (LOPRESSOR) 25 MG tablet TAKE 1 TABLET (25 MG TOTAL) BY MOUTH 2 (TWO) TIMES DAILY.   No current facility-administered medications on file prior to visit.    Review of Systems Per HPI unless specifically indicated above    Objective:    BP 146/74  Pulse 68  Temp(Src) 97.9 F (36.6 C) (Oral)  Wt 134 lb 8 oz (61.009 kg)  Physical Exam  Nursing note and vitals reviewed. Constitutional: She is oriented to person, place, and time. She appears well-developed and well-nourished. No distress.  Musculoskeletal: She exhibits edema (1+ pedal edema).  No pain midline spine No paraspinous mm tenderness Neg SLR  bilaterally. Mild discomfort at lateral left thigh with int/ext rotation at hip. + FABER on left. No pain at SIJ, GTB or sciatic notch bilaterally.  Neurological: She is alert and oriented to person, place, and time.  5/5 strength BLE  Skin: Skin is warm and dry. No rash noted.       Assessment & Plan:   Problem List Items Addressed This Visit   Osteoporosis     Off bisphosphonate. Will continue to avoid 2/2 h/o GI bleed and she was on these medications for 10-15 yrs per daughter. Advised start calcium/vit D chewable once daily.    Left hip pain - Primary     Anticipate sciatica type pain - given bisphosphonate use, longstanding nature of pain and h/o hip replacement, will check L hip xrays  Discussed tylenol prn pain. Will call with results of xray.    Relevant Orders      DG Hip Complete Left       Follow up plan: Return if symptoms worsen or fail to improve.

## 2013-11-07 ENCOUNTER — Encounter: Payer: Medicare Other | Admitting: *Deleted

## 2013-11-07 ENCOUNTER — Telehealth: Payer: Self-pay | Admitting: Cardiology

## 2013-11-07 NOTE — Telephone Encounter (Signed)
Confirmed remote transmission with pt daughter.  

## 2013-11-08 ENCOUNTER — Encounter: Payer: Self-pay | Admitting: Cardiology

## 2013-12-07 ENCOUNTER — Encounter: Payer: Self-pay | Admitting: Cardiology

## 2013-12-23 ENCOUNTER — Other Ambulatory Visit: Payer: Self-pay | Admitting: Internal Medicine

## 2014-01-04 ENCOUNTER — Telehealth: Payer: Self-pay | Admitting: Family Medicine

## 2014-01-04 NOTE — Telephone Encounter (Signed)
Larene Beach with [employer] is calling to request a verbal and written order for OT and PT for the patient. They state that they have already faxed the request for this and are following up.

## 2014-01-05 NOTE — Telephone Encounter (Signed)
This should have been faxed yesterday. Ok to give verbal ok.

## 2014-01-05 NOTE — Telephone Encounter (Signed)
Spoke with Rachel Shah and confirmed that she did receive fax.

## 2014-01-11 DIAGNOSIS — R262 Difficulty in walking, not elsewhere classified: Secondary | ICD-10-CM

## 2014-01-11 DIAGNOSIS — R279 Unspecified lack of coordination: Secondary | ICD-10-CM

## 2014-01-11 DIAGNOSIS — M6281 Muscle weakness (generalized): Secondary | ICD-10-CM

## 2014-01-11 DIAGNOSIS — R4582 Worries: Secondary | ICD-10-CM

## 2014-01-13 DIAGNOSIS — R279 Unspecified lack of coordination: Secondary | ICD-10-CM | POA: Diagnosis not present

## 2014-01-13 DIAGNOSIS — R262 Difficulty in walking, not elsewhere classified: Secondary | ICD-10-CM | POA: Diagnosis not present

## 2014-01-13 DIAGNOSIS — M6281 Muscle weakness (generalized): Secondary | ICD-10-CM | POA: Diagnosis not present

## 2014-01-14 ENCOUNTER — Encounter: Payer: Self-pay | Admitting: Family Medicine

## 2014-01-15 ENCOUNTER — Other Ambulatory Visit: Payer: Self-pay | Admitting: Internal Medicine

## 2014-01-21 ENCOUNTER — Emergency Department: Payer: Self-pay | Admitting: Emergency Medicine

## 2014-01-24 ENCOUNTER — Telehealth: Payer: Self-pay

## 2014-01-24 NOTE — Telephone Encounter (Signed)
Rachel Shah, pts daughter left v/m;(DPR signed to discuss pt with Stanton Kidney) pt having problems not remembering and Stanton Kidney wants to know what to do to get guardianship for her mother; pt is 80 yrs old. Mary request cb. Pt last seen sick visit 09/30/2013.Please advise.

## 2014-01-25 NOTE — Telephone Encounter (Signed)
Legal guardianship usually comes through judge/lawyer. Is daughter designated health care power of attorney for patient? This would be in advanced directive form and unfortunately we do not have copy of this form in our chart.  What I do have is Hospital doctor form which appoints Mardie Kellen as durable power of attorney - legal aspects not healthcare aspects. May mail advanced directive form with HCPOA form to daughter if she desires. If there is concern about patient making informed health care decisions for herself would offer appointment to discuss.

## 2014-01-25 NOTE — Telephone Encounter (Signed)
Spoke with patient's daughter and scheduled appt. She said she would need documentation that patient wasn't capable of making informed decisions before she could go further.

## 2014-02-03 ENCOUNTER — Encounter: Payer: Self-pay | Admitting: Family Medicine

## 2014-02-03 ENCOUNTER — Ambulatory Visit (INDEPENDENT_AMBULATORY_CARE_PROVIDER_SITE_OTHER): Payer: Medicare Other | Admitting: Family Medicine

## 2014-02-03 VITALS — BP 136/72 | HR 72 | Temp 97.8°F | Wt 136.0 lb

## 2014-02-03 DIAGNOSIS — I4891 Unspecified atrial fibrillation: Secondary | ICD-10-CM

## 2014-02-03 DIAGNOSIS — Z7189 Other specified counseling: Secondary | ICD-10-CM | POA: Insufficient documentation

## 2014-02-03 DIAGNOSIS — I10 Essential (primary) hypertension: Secondary | ICD-10-CM

## 2014-02-03 DIAGNOSIS — R600 Localized edema: Secondary | ICD-10-CM | POA: Insufficient documentation

## 2014-02-03 DIAGNOSIS — R413 Other amnesia: Secondary | ICD-10-CM

## 2014-02-03 DIAGNOSIS — I5032 Chronic diastolic (congestive) heart failure: Secondary | ICD-10-CM

## 2014-02-03 LAB — CBC WITH DIFFERENTIAL/PLATELET
Basophils Absolute: 0 10*3/uL (ref 0.0–0.1)
Basophils Relative: 0 % (ref 0–1)
Eosinophils Absolute: 0.2 10*3/uL (ref 0.0–0.7)
Eosinophils Relative: 3 % (ref 0–5)
HCT: 42.4 % (ref 36.0–46.0)
Hemoglobin: 14 g/dL (ref 12.0–15.0)
Lymphocytes Relative: 19 % (ref 12–46)
Lymphs Abs: 1.4 10*3/uL (ref 0.7–4.0)
MCH: 31.7 pg (ref 26.0–34.0)
MCHC: 33 g/dL (ref 30.0–36.0)
MCV: 95.9 fL (ref 78.0–100.0)
MPV: 12.2 fL (ref 8.6–12.4)
Monocytes Absolute: 0.5 10*3/uL (ref 0.1–1.0)
Monocytes Relative: 7 % (ref 3–12)
NEUTROS ABS: 5.3 10*3/uL (ref 1.7–7.7)
NEUTROS PCT: 71 % (ref 43–77)
Platelets: 238 10*3/uL (ref 150–400)
RBC: 4.42 MIL/uL (ref 3.87–5.11)
RDW: 15.5 % (ref 11.5–15.5)
WBC: 7.4 10*3/uL (ref 4.0–10.5)

## 2014-02-03 NOTE — Patient Instructions (Addendum)
Advanced directive packet provided today.  Continue lasix 20mg  daily. Continue elevating legs, and avoiding salt in your diet.  Would consider low dose compression let me know if we want to try. Blood work today. Good to see you today, call us with questions.

## 2014-02-03 NOTE — Assessment & Plan Note (Addendum)
Anticipate due to chronic venous insuff.  rec continued elevation of legs as well as avoiding salt in diet. Described some burning shin discomfort but declines pharmacotherapy.

## 2014-02-03 NOTE — Progress Notes (Signed)
BP 136/72 mmHg  Pulse 72  Temp(Src) 97.8 F (36.6 C) (Oral)  Wt 136 lb (61.689 kg)  Body mass index is 26.56 kg/(m^2).   CC: daughter has memory concerns Subjective:    Patient ID: Rachel Shah, female    DOB: 1910-02-06, 81 y.o.   MRN: [id number]  HPI: MARILENA TREVATHAN is a 81 y.o. female presenting on 02/03/2014 for Memory Loss   Glorian presents with daughter today to discuss memory concerns. Now living in [facility]. Upset over move and has had trouble adjusting to new location but daughter feels this was safest place for patient. Has access to PT but doesn't want to participate. No h/o dementia but daughter is worried about some recent endorsed memory lapses - ie when moving clothes in closet didn't think a fur coat was hers, but daughter took it out of her closet - may have been given to her by a friend.   Leg swelling noted. Some bruising noted as well. Not on blood thinners. Pt's main concern is persistent leg swelling.   Advanced planning: discussed with patient. She has living will at home but we do not have a copy. She wants "doctors to decide". Would consider temporary compression/shock, temporary breathing tube and life support. Doesn't want prolonged life support, does not want feeding tube. Agrees that daughter would be HCPOA. Form provided today.  1.5 wks ago with impacted bowel - treated at ER. Bowels now more regular.  Limited mental status exam: Time: 5/5  Place: 2/5 - moved from [place] so unfamiliar with area  Recall: 1/3, 3.3 with cue Calculation: 5/5 serial 3s   Relevant past medical, surgical, family and social history reviewed and updated as indicated. Interim medical history since our last visit reviewed. Allergies and medications reviewed and updated. Current Outpatient Prescriptions on File Prior to Visit  Medication Sig  . diltiazem (CARDIZEM CD) 240 MG 24 hr capsule TAKE 1 CAPSULE (240 MG TOTAL) BY MOUTH DAILY.  .  furosemide (LASIX) 20 MG tablet TAKE 1 TABLET BY MOUTH EVERY DAY  . metoprolol tartrate (LOPRESSOR) 25 MG tablet TAKE 1 TABLET BY MOUTH 2 TIMES DAILY.   No current facility-administered medications on file prior to visit.   Past Medical History  Diagnosis Date  . Atrial fibrillation   . Supraventricular tachycardia   . Tachy-brady syndrome     post Medtronic revealed pulse generator dual lead pacemaker  . Chronic diastolic heart failure     a. hx of diastolic CHF;  b. dx with systolic CHF 02/5425 in [place], [place];   c. admx to [facility] 09/2011  in [place], [place] => s/p bilat thoracentesis with neg cytology;   d. Echo 9/13:  mod LAE, mild LVH, EF 30-35%, ant septal severe HK, Gr 2 diast dysfn, mild reduced RVSF, mild MR, Ao sclerosis without AS, mild to mod TR, RVSP 51, mild to mod PR;  e. Echo 11/13: EF 60%  . Angina at rest   . Transient ischemic attack 2007  . Thrombocytopenia     Mild  . Abnormal thyroid function test     Mildly abnormal thyroid function studies with a TSH of 7.984 (upper limit of normal 4.5), free T4 within normal limits at 1.21 and T3  slightly low at 69.2 (lower limit of normal 80), follow up with primary care  . Hypertension   . High cholesterol   . Pacemaker -Medtronic   . Shortness of breath   . History of blood  transfusion 12/02/2011  . Lower GI bleeding   . Hard of hearing   . DVT (deep venous thrombosis) 09/2011    partially occlusive right peroneal vein DVT by [place] at [facility] in [place], [place]  . Osteoporosis     presumed as on actonel and compression deformities noted on CXR  . Nontraumatic hemoperitoneum 11/2011    with hemorrhagic shock from coumadin induced coagulopathy so stopped  . Fibroids     hx  . CVA (cerebral infarction) 09/2011    evidence found on latest eye exam with R hemianopsia per ophtho    Past Surgical History  Procedure Laterality Date  . Cataract extraction w/ intraocular lens implant  2012    left  . Dilation and  curettage of uterus    . Cardiac pacemaker placement  ?02/2010  . Left hip fracture  1987    pins  . Cataract extraction Bilateral    Review of Systems Per HPI unless specifically indicated above     Objective:    BP 136/72 mmHg  Pulse 72  Temp(Src) 97.8 F (36.6 C) (Oral)  Wt 136 lb (61.689 kg)  Wt Readings from Last 3 Encounters:  02/03/14 136 lb (61.689 kg)  09/30/13 134 lb 8 oz (61.009 kg)  08/02/13 130 lb 8 oz (59.194 kg)    Physical Exam  Constitutional: She appears well-developed and well-nourished. No distress.  HENT:  Mouth/Throat: Oropharynx is clear and moist. No oropharyngeal exudate.  Eyes: Conjunctivae and EOM are normal. Pupils are equal, round, and reactive to light. No scleral icterus.  Neck: Normal range of motion. Neck supple.  Cardiovascular: Normal rate, regular rhythm, normal heart sounds and intact distal pulses.   No murmur heard. Pulmonary/Chest: Effort normal and breath sounds normal. No respiratory distress. She has no wheezes. She has no rales.  Musculoskeletal: She exhibits edema (1+ pedal bilaterallyl).  Skin: Skin is warm and dry. No rash noted.  Nursing note and vitals reviewed.     Assessment & Plan:   Problem List Items Addressed This Visit    Pedal edema    Anticipate due to chronic venous insuff.  rec continued elevation of legs as well as avoiding salt in diet. Described some burning shin discomfort but declines pharmacotherapy.    Relevant Orders      CBC with Differential (Completed)      TSH (Completed)      Comprehensive metabolic panel (Completed)      Brain natriuretic peptide (Completed)   Memory impairment - Primary    Reviewed daughter's concerns. On limited memory testing seems overall preserved; main struggle seems to be adjusting to losing independence. I do think patient is currently capable of making and understanding healthcare decisions but this could change. Daughter has already asked patient's financial advisor to  run her financial decisions past daughter. ?Possibly mild senile dementia.    HTN (hypertension)    Chronic, stable. Continue regimen.    Chronic diastolic heart failure    Seems euvolemic.    Relevant Orders      CBC with Differential (Completed)      TSH (Completed)      Comprehensive metabolic panel (Completed)      Brain natriuretic peptide (Completed)   Atrial fibrillation    Sounds reg today.    Relevant Orders      CBC with Differential (Completed)      TSH (Completed)      Comprehensive metabolic panel (Completed)      Brain natriuretic  peptide (Completed)   Advanced care planning/counseling discussion    Advanced planning: discussed with patient. She has living will at home but we do not have a copy. She wants "doctors to decide". Would consider temporary compression/shock, temporary breathing tube and life support. Doesn't want prolonged life support, does not want feeding tube. Agrees that daughter would be HCPOA. Form provided today.    Relevant Orders      CBC with Differential (Completed)      TSH (Completed)      Comprehensive metabolic panel (Completed)      Brain natriuretic peptide (Completed)       Follow up plan: Return in about 6 months (around 08/04/2014), or as needed, for follow up visit.

## 2014-02-03 NOTE — Progress Notes (Signed)
Pre visit review using our clinic review tool, if applicable. No additional management support is needed unless otherwise documented below in the visit note. 

## 2014-02-04 DIAGNOSIS — R413 Other amnesia: Secondary | ICD-10-CM | POA: Insufficient documentation

## 2014-02-04 LAB — COMPREHENSIVE METABOLIC PANEL
ALT: 9 U/L (ref 0–35)
AST: 14 U/L (ref 0–37)
Albumin: 3.9 g/dL (ref 3.5–5.2)
Alkaline Phosphatase: 50 U/L (ref 39–117)
BUN: 21 mg/dL (ref 6–23)
CALCIUM: 9.3 mg/dL (ref 8.4–10.5)
CHLORIDE: 103 meq/L (ref 96–112)
CO2: 26 meq/L (ref 19–32)
Creat: 0.88 mg/dL (ref 0.50–1.10)
GLUCOSE: 85 mg/dL (ref 70–99)
POTASSIUM: 4.8 meq/L (ref 3.5–5.3)
Sodium: 142 mEq/L (ref 135–145)
TOTAL PROTEIN: 6.4 g/dL (ref 6.0–8.3)
Total Bilirubin: 1.1 mg/dL (ref 0.2–1.2)

## 2014-02-04 LAB — BRAIN NATRIURETIC PEPTIDE: Brain Natriuretic Peptide: 151 pg/mL — ABNORMAL HIGH (ref 0.0–100.0)

## 2014-02-04 LAB — TSH: TSH: 3.392 u[IU]/mL (ref 0.350–4.500)

## 2014-02-04 NOTE — Assessment & Plan Note (Signed)
Sounds reg today.

## 2014-02-04 NOTE — Assessment & Plan Note (Signed)
Chronic, stable. Continue regimen. 

## 2014-02-04 NOTE — Assessment & Plan Note (Signed)
Advanced planning: discussed with patient. She has living will at home but we do not have a copy. She wants "doctors to decide". Would consider temporary compression/shock, temporary breathing tube and life support. Doesn't want prolonged life support, does not want feeding tube. Agrees that daughter would be HCPOA. Form provided today.

## 2014-02-04 NOTE — Assessment & Plan Note (Signed)
Seems euvolemic.  

## 2014-02-04 NOTE — Assessment & Plan Note (Signed)
Reviewed daughter's concerns. On limited memory testing seems overall preserved; main struggle seems to be adjusting to losing independence. I do think patient is currently capable of making and understanding healthcare decisions but this could change. Daughter has already asked patient's financial advisor to run her financial decisions past daughter. ?Possibly mild senile dementia.

## 2014-02-06 ENCOUNTER — Encounter: Payer: Self-pay | Admitting: *Deleted

## 2014-02-08 ENCOUNTER — Ambulatory Visit (INDEPENDENT_AMBULATORY_CARE_PROVIDER_SITE_OTHER): Payer: Medicare Other | Admitting: *Deleted

## 2014-02-08 DIAGNOSIS — I495 Sick sinus syndrome: Secondary | ICD-10-CM

## 2014-02-08 LAB — MDC_IDC_ENUM_SESS_TYPE_INCLINIC
Battery Voltage: 3 V
Brady Statistic AP VP Percent: 0.12 %
Brady Statistic AP VS Percent: 89.49 %
Brady Statistic AS VP Percent: 0.05 %
Brady Statistic RA Percent Paced: 89.61 %
Brady Statistic RV Percent Paced: 0.17 %
Date Time Interrogation Session: 20160113150055
Lead Channel Impedance Value: 480 Ohm
Lead Channel Pacing Threshold Amplitude: 0.5 V
Lead Channel Pacing Threshold Amplitude: 0.5 V
Lead Channel Pacing Threshold Pulse Width: 0.4 ms
Lead Channel Pacing Threshold Pulse Width: 0.4 ms
Lead Channel Sensing Intrinsic Amplitude: 18.6278
Lead Channel Sensing Intrinsic Amplitude: 2.8486
Lead Channel Setting Pacing Amplitude: 2 V
Lead Channel Setting Pacing Amplitude: 2.5 V
Lead Channel Setting Sensing Sensitivity: 0.9 mV
MDC IDC MSMT LEADCHNL RV IMPEDANCE VALUE: 616 Ohm
MDC IDC SET LEADCHNL RV PACING PULSEWIDTH: 0.4 ms
MDC IDC STAT BRADY AS VS PERCENT: 10.34 %
Zone Setting Detection Interval: 400 ms
Zone Setting Detection Interval: 400 ms

## 2014-02-08 NOTE — Progress Notes (Signed)
Pacemaker check in clinic. Normal device function. Thresholds, sensing, impedances consistent with previous measurements. Device programmed to maximize longevity. 7 mode switches all < 1 minute.  10 SVT and 17 VT episodes that are 1:1 conduction, max A/V 163bpm.   Device programmed at appropriate safety margins. Histogram distribution appropriate for patient activity level. Device programmed to optimize intrinsic conduction.  Patient education completed.  ROV 6 months with Dr. Caryl Comes.

## 2014-02-27 ENCOUNTER — Encounter: Payer: Self-pay | Admitting: Internal Medicine

## 2014-03-28 DIAGNOSIS — M6281 Muscle weakness (generalized): Secondary | ICD-10-CM

## 2014-03-28 HISTORY — DX: Muscle weakness (generalized): M62.81

## 2014-04-04 ENCOUNTER — Encounter: Payer: Self-pay | Admitting: Family Medicine

## 2014-04-12 ENCOUNTER — Other Ambulatory Visit: Payer: Self-pay | Admitting: Family Medicine

## 2014-04-18 ENCOUNTER — Other Ambulatory Visit: Payer: Self-pay | Admitting: Internal Medicine

## 2014-08-03 ENCOUNTER — Encounter: Payer: Self-pay | Admitting: Internal Medicine

## 2014-08-07 ENCOUNTER — Other Ambulatory Visit: Payer: Self-pay

## 2014-08-07 ENCOUNTER — Other Ambulatory Visit: Payer: Self-pay | Admitting: Internal Medicine

## 2014-08-07 MED ORDER — METOPROLOL TARTRATE 25 MG PO TABS: ORAL_TABLET | ORAL | Status: DC

## 2014-08-07 NOTE — Telephone Encounter (Signed)
Please review for refill. Thanks!  

## 2014-08-13 ENCOUNTER — Other Ambulatory Visit: Payer: Self-pay | Admitting: Internal Medicine

## 2014-09-01 ENCOUNTER — Other Ambulatory Visit: Payer: Self-pay | Admitting: Internal Medicine

## 2014-09-04 ENCOUNTER — Encounter: Payer: Self-pay | Admitting: Family Medicine

## 2014-09-04 ENCOUNTER — Ambulatory Visit (INDEPENDENT_AMBULATORY_CARE_PROVIDER_SITE_OTHER): Payer: Medicare Other | Admitting: Family Medicine

## 2014-09-04 VITALS — BP 108/72 | HR 65 | Temp 97.7°F | Ht 60.0 in | Wt 137.5 lb

## 2014-09-04 DIAGNOSIS — L03116 Cellulitis of left lower limb: Secondary | ICD-10-CM | POA: Diagnosis not present

## 2014-09-04 DIAGNOSIS — R600 Localized edema: Secondary | ICD-10-CM | POA: Diagnosis not present

## 2014-09-04 DIAGNOSIS — L0390 Cellulitis, unspecified: Secondary | ICD-10-CM | POA: Insufficient documentation

## 2014-09-04 MED ORDER — DOXYCYCLINE HYCLATE 100 MG PO TABS: 100.0000 mg | ORAL_TABLET | Freq: Two times a day (BID) | ORAL | Status: DC

## 2014-09-04 NOTE — Progress Notes (Signed)
Pre visit review using our clinic review tool, if applicable. No additional management support is needed unless otherwise documented below in the visit note. 

## 2014-09-04 NOTE — Assessment & Plan Note (Signed)
This exam consistent with lower extremity cellulitis. Treating with doxycycline 10 days. Rx given today.  Encouraged patient to follow closely with primary physician.

## 2014-09-04 NOTE — Progress Notes (Signed)
   Subjective:  Patient ID: Rachel Shah, female    DOB: 04-13-1910  Age: 81 y.o. MRN: [id number]  CC: Lower leg swelling/redness.  HPI 81 year old female with a past medical history of hypertension, tachybradycardia syndrome status post pacemaker placement, and chronic diastolic heart failure presents with complaints of lower swelling and left lower leg redness.  Patient accompanied by daughter today.  Patient and daughter report that since this past Thursday patient has had significantly worse bilateral lower extremity edema. She is also developed left lower leg redness. There is no associated pain or discomfort. No recent fevers or chills. No exacerbating or relieving factors. Patient does try to elevate her legs often. Compression stockings have been recommended in the past but patient does not wear them as they are uncomfortable and difficult to get on.  No reported increased SOB. Daughter does note that she is "wheezy".   Social Hx - Nonsmoker.  Review of Systems  Constitutional: Negative for fever and chills.  Respiratory: Positive for wheezing. Negative for shortness of breath.   Cardiovascular: Positive for leg swelling.    Objective:  BP 108/72 mmHg  Pulse 65  Temp(Src) 97.7 F (36.5 C) (Oral)  Ht 5' (1.524 m)  Wt 137 lb 8 oz (62.37 kg)  BMI 26.85 kg/m2  SpO2 95%  BP/Weight 09/04/2014 09/28/5054 10/04/9478  Systolic BP 165 537 482  Diastolic BP 72 72 74  Wt. (Lbs) 137.5 136 134.5  BMI 26.85 26.56 26.27     Physical Exam  Constitutional:  Well-appearing elderly female in no acute distress.  Cardiovascular: Normal rate and regular rhythm.   Murmur heard.  Systolic murmur is present with a grade of 3/6  Pulmonary/Chest: Effort normal and breath sounds normal. No respiratory distress. She has no wheezes. She has no rales.  Musculoskeletal:  2-3 + pitting edema noted bilaterally. Edema extends to the knee.  Skin:  Area of erythema noted just above the left ankle  joint. Is associated warmth. No open wounds or drainage.    Assessment & Plan:   Problem List Items Addressed This Visit    None      Meds ordered this encounter  Medications  . doxycycline (VIBRA-TABS) 100 MG tablet    Sig: Take 1 tablet (100 mg total) by mouth 2 (two) times daily.    Dispense:  20 tablet    Refill:  0     Follow-up: Return if symptoms worsen or fail to improve.    Thersa Salt, DO

## 2014-09-04 NOTE — Assessment & Plan Note (Signed)
Patient appears to be euvolemic on exam today. Edema is likely secondary to chronic venous insufficiency and decreased mobility. Advised elevation and compression.  Patient reports that compression hose is uncomfortable and difficult to get on therefore she will not proceed with this.

## 2014-09-04 NOTE — Patient Instructions (Signed)
Take the antibiotic twice daily as prescribed.  Follow up with Dr.G if things fail to improve.  If she worsens take her to the hospital.  Be sure that her legs stay elevated.  Compression would be ideal as well  Take care  Dr. Lacinda Axon

## 2014-09-06 ENCOUNTER — Ambulatory Visit (INDEPENDENT_AMBULATORY_CARE_PROVIDER_SITE_OTHER): Payer: Medicare Other | Admitting: Nurse Practitioner

## 2014-09-06 ENCOUNTER — Encounter: Payer: Self-pay | Admitting: Nurse Practitioner

## 2014-09-06 VITALS — BP 142/62 | HR 63 | Ht 60.0 in | Wt 137.0 lb

## 2014-09-06 DIAGNOSIS — R600 Localized edema: Secondary | ICD-10-CM | POA: Diagnosis not present

## 2014-09-06 DIAGNOSIS — I480 Paroxysmal atrial fibrillation: Secondary | ICD-10-CM | POA: Diagnosis not present

## 2014-09-06 DIAGNOSIS — I495 Sick sinus syndrome: Secondary | ICD-10-CM | POA: Diagnosis not present

## 2014-09-06 DIAGNOSIS — I5032 Chronic diastolic (congestive) heart failure: Secondary | ICD-10-CM

## 2014-09-06 NOTE — Patient Instructions (Signed)
Medication Instructions:  Your physician recommends that you continue on your current medications as directed. Please refer to the Current Medication list given to you today.  Labwork: None ordered  Testing/Procedures: None ordered  Follow-Up: Your physician wants you to follow-up in: 6 months with Dr. Caryl Comes. You will receive a reminder letter in the mail two months in advance. If you don't receive a letter, please call our office to schedule the follow-up appointment.  Any Other Special Instructions Will Be Listed Below (If Applicable). Thank you for choosing [facility]!!

## 2014-09-06 NOTE — Progress Notes (Signed)
Electrophysiology Office Note Date: 09/06/2014  ID:  Rachel Shah, Rachel Shah [birth year], MRN [id number]  PCP: [name], MD Electrophysiologist: Caryl Comes  CC: Pacemaker follow-up  Rachel Shah is a 81 y.o. female seen today for Dr Caryl Comes.  She presents today for routine electrophysiology followup.  Since last being seen in our clinic, the patient reports doing very well.  She denies chest pain, palpitations, dyspnea, PND, orthopnea, nausea, vomiting, dizziness, syncope, weight gain, or early satiety.  She has had problems with persistent LE edema and was recently seen by her PCP who treated her for cellulitis and recommended elevation and compression hose.    Device History: MDT dual chamber PPM implanted 2012 for post termination pauses   Past Medical History  Diagnosis Date  . Atrial fibrillation   . Supraventricular tachycardia   . Tachy-brady syndrome     post Medtronic revealed pulse generator dual lead pacemaker  . Chronic diastolic heart failure     a. hx of diastolic CHF;  b. dx with systolic CHF 08/996 in [place], [place];   c. admx to [facility] 09/2011  in [place], [place] => s/p bilat thoracentesis with neg cytology;   d. Echo 9/13:  mod LAE, mild LVH, EF 30-35%, ant septal severe HK, Gr 2 diast dysfn, mild reduced RVSF, mild MR, Ao sclerosis without AS, mild to mod TR, RVSP 51, mild to mod PR;  e. Echo 11/13: EF 60%  . Transient ischemic attack 2007  . Thrombocytopenia     Mild  . Abnormal thyroid function test     Mildly abnormal thyroid function studies with a TSH of 7.984 (upper limit of normal 4.5), free T4 within normal limits at 1.21 and T3  slightly low at 69.2 (lower limit of normal 80), follow up with primary care  . Hypertension   . High cholesterol   . Lower GI bleeding   . Hard of hearing   . DVT (deep venous thrombosis) 09/2011    partially occlusive right peroneal vein DVT by [place] at [facility] in [place], [place]  . Osteoporosis     presumed as on  actonel and compression deformities noted on CXR  . Nontraumatic hemoperitoneum 11/2011    with hemorrhagic shock from coumadin induced coagulopathy so stopped  . Fibroids     hx  . CVA (cerebral infarction) 09/2011    evidence found on latest eye exam with R hemianopsia per ophtho  . Generalized muscle weakness 03/2014    working with PT at rehab   Past Surgical History  Procedure Laterality Date  . Cataract extraction w/ intraocular lens implant  2012    left  . Dilation and curettage of uterus    . Cardiac pacemaker placement  2012    MDT dual chamber PPM implanted for tachy-brady syndrome  . Left hip fracture  1987    pins  . Cataract extraction Bilateral     Current Outpatient Prescriptions  Medication Sig Dispense Refill  . diltiazem (CARDIZEM CD) 240 MG 24 hr capsule TAKE 1 CAPSULE (240 MG TOTAL) BY MOUTH DAILY. 30 capsule 3  . doxycycline (VIBRA-TABS) 100 MG tablet Take 1 tablet (100 mg total) by mouth 2 (two) times daily. 20 tablet 0  . furosemide (LASIX) 20 MG tablet TAKE 1 TABLET BY MOUTH EVERY DAY 30 tablet 6  . metoprolol tartrate (LOPRESSOR) 25 MG tablet TAKE 1 TABLET BY MOUTH 2 TIMES DAILY. ( NEEDS APPT. WITH DOCTOR FOR FUTURE REFILLS) 60 tablet 3  No current facility-administered medications for this visit.    Allergies:   Plavix and Coumadin   Social History: Social History   Social History  . Marital Status: Widowed    Spouse Name: N/A  . Number of Children: N/A  . Years of Education: N/A   Occupational History  . Not on file.   Social History Main Topics  . Smoking status: Never Smoker   . Smokeless tobacco: Never Used  . Alcohol Use: No  . Drug Use: No  . Sexual Activity: No   Other Topics Concern  . Not on file   Social History Narrative   Recently moved from [place] to live with daughter   Now moved into independent living facility (2015)    Family History: Family History  Problem Relation Age of Onset  . Cancer Sister   . CAD Neg  Hx      Review of Systems: All other systems reviewed and are otherwise negative except as noted above.   Physical Exam: VS:  BP 142/62 mmHg  Pulse 63  Ht 5' (1.524 m)  Wt 137 lb (62.143 kg)  BMI 26.76 kg/m2  SpO2 96% , BMI Body mass index is 26.76 kg/(m^2).  GEN- The patient is elderly appearing, alert and oriented x 3 today.   HEENT: normocephalic, atraumatic; sclera clear, conjunctiva pink; hearing intact; oropharynx clear; neck supple  Lungs- Clear to ausculation bilaterally, normal work of breathing.  No wheezes, rales, rhonchi Heart- Regular rate and rhythm   GI- soft, non-tender, non-distended, bowel sounds present  Extremities- no clubbing, cyanosis,2+ LE edema; DP/PT/radial pulses 2+ bilaterally MS- no significant deformity or atrophy Skin- warm and dry, no rash or lesion; PPM pocket well healed Psych- euthymic mood, full affect Neuro- strength and sensation are intact  PPM Interrogation- reviewed in detail today,  See PACEART report  EKG:  EKG is not ordered today.  Recent Labs: 02/03/2014: ALT 9; BUN 21; Creat 0.88; Hemoglobin 14.0; Platelets 238; Potassium 4.8; Sodium 142; TSH 3.392   Wt Readings from Last 3 Encounters:  09/06/14 137 lb (62.143 kg)  09/04/14 137 lb 8 oz (62.37 kg)  02/03/14 136 lb (61.689 kg)     Other studies Reviewed: Additional studies/ records that were reviewed today include: Dr Olin Pia office notes, PCP's office notes  Assessment and Plan:  1.  Tachy/brady syndrome Normal PPM function See Pace Art report No changes today  2.  Paroxysmal atrial fibrillation Maintaining SR by device interrogation today No anticoagulation 2/2 previous hemoperitoneum  3.  Chronic diastolic heart failure Euvolemic on exam Continue medical therapy  4.  LE edema Likely related to venous insufficiency  Recommend elevation of lower extremities as much as possible as well as compression hose   Current medicines are reviewed at length with the  patient today.   The patient does not have concerns regarding her medicines.  The following changes were made today:  none  Labs/ tests ordered today include: none  Disposition:   Follow up with Dr Caryl Comes in 6 months (pt preference)   Signed, Chanetta Marshall, NP 09/06/2014 3:09 PM  [facility] [address] [place] [address] [phone] (office) [phone] (fax)

## 2014-09-07 LAB — CUP PACEART INCLINIC DEVICE CHECK
Date Time Interrogation Session: 20160811142158
Lead Channel Setting Pacing Amplitude: 2.5 V
Lead Channel Setting Pacing Pulse Width: 0.4 ms
Lead Channel Setting Sensing Sensitivity: 0.9 mV
MDC IDC SET LEADCHNL RA PACING AMPLITUDE: 2 V
MDC IDC SET ZONE DETECTION INTERVAL: 400 ms
Zone Setting Detection Interval: 400 ms

## 2014-10-05 ENCOUNTER — Ambulatory Visit: Payer: Self-pay | Admitting: Podiatry

## 2014-10-17 ENCOUNTER — Encounter: Payer: Self-pay | Admitting: Internal Medicine

## 2014-10-17 ENCOUNTER — Other Ambulatory Visit: Payer: Self-pay | Admitting: Internal Medicine

## 2014-10-23 IMAGING — CT CT ABD-PELV W/ CM
2 of 5 series · 13 of 46 positions shown, 15 images · IV contrast (APPLIED)
Comparison: None.

CLINICAL DATA: Lower abdominal pain since last night, evaluate for
GI bleed or hematoma

CT ABDOMEN AND PELVIS WITH CONTRAST
TECHNIQUE: Multidetector CT imaging of the abdomen and pelvis was
performed following the standard protocol during bolus
administration of intravenous contrast.
Contrast: 75mL OMNIPAQUE IOHEXOL 300 MG/ML  SOLN

[Series 2: abd/pelv with 5.0 b31f st · axial · 0.68mm/px · z∈[-436,-116]mm · 10 of 74 slices shown, 12 images]
[im 5/74  soft-tissue]
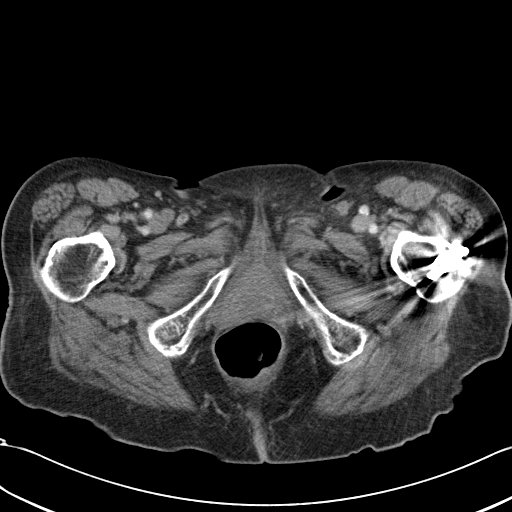
[im 5/74  bone]
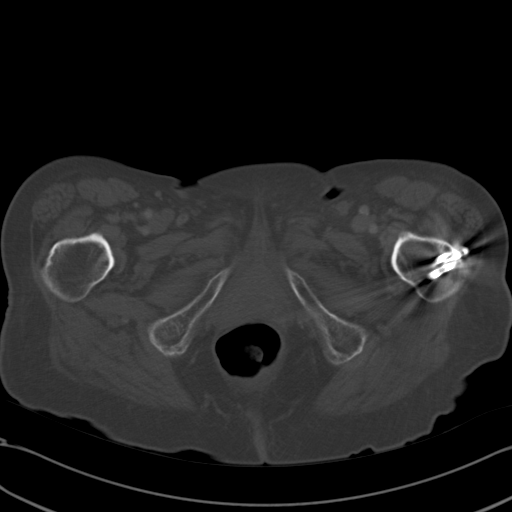
[im 13/74  soft-tissue]
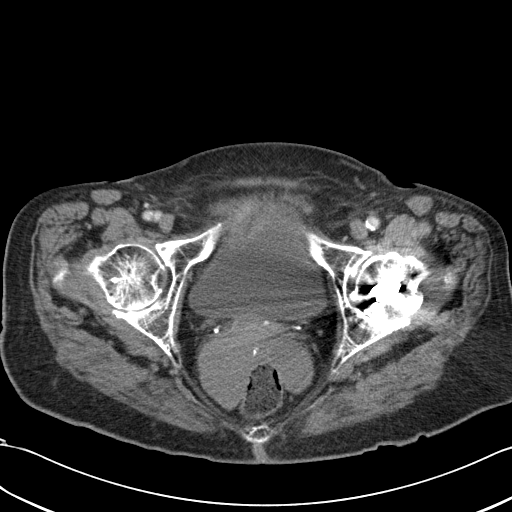
[im 21/74  soft-tissue]
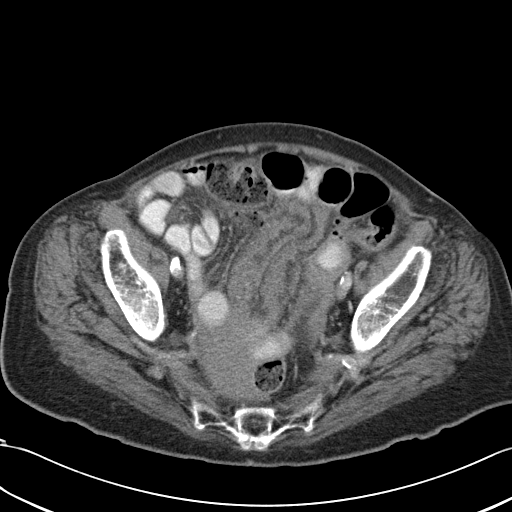
[im 25/74  soft-tissue]
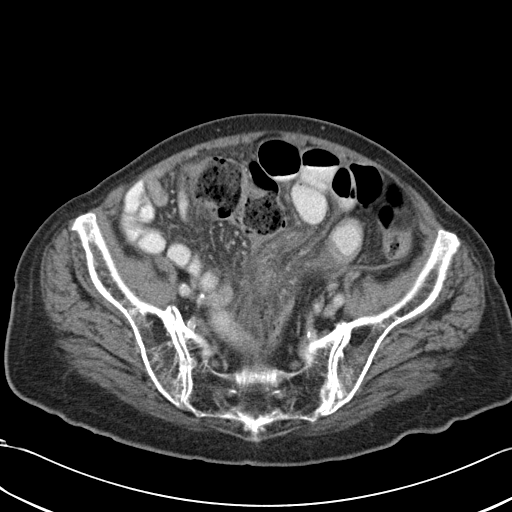
[im 33/74  soft-tissue]
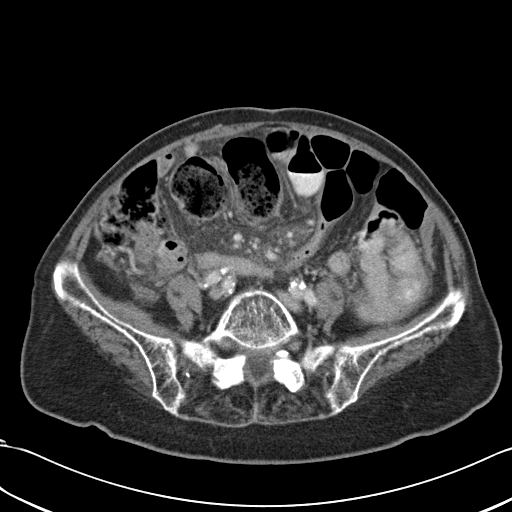
[im 41/74  soft-tissue]
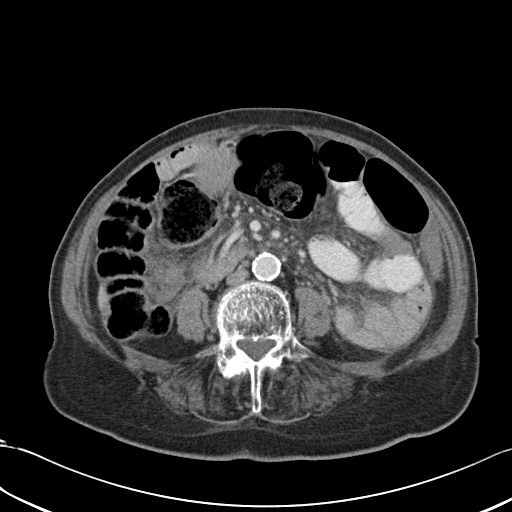
[im 49/74  soft-tissue]
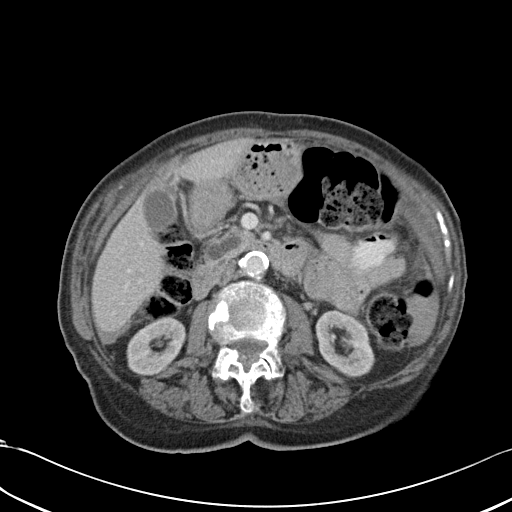
[im 53/74  soft-tissue]
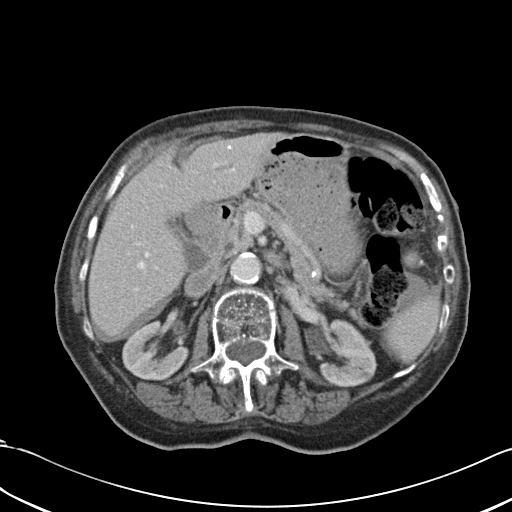
[im 61/74  soft-tissue]
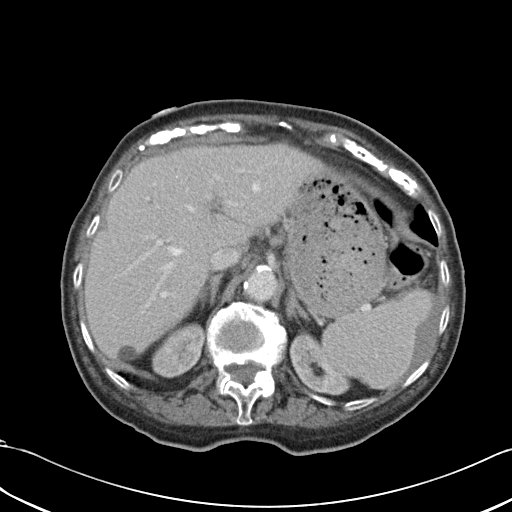
[im 61/74  bone]
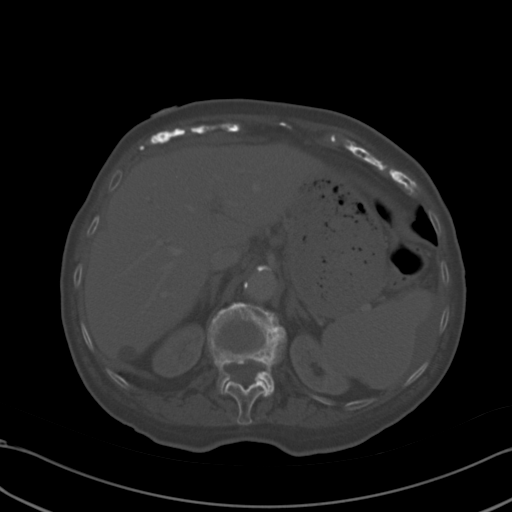
[im 69/74  soft-tissue]
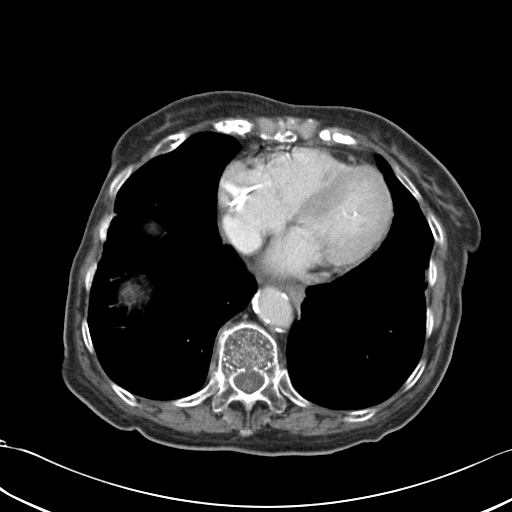

[Series 5: coronals · coronal · 0.59mm/px · 3 of 112 slices shown]
[im 38/112  soft-tissue]
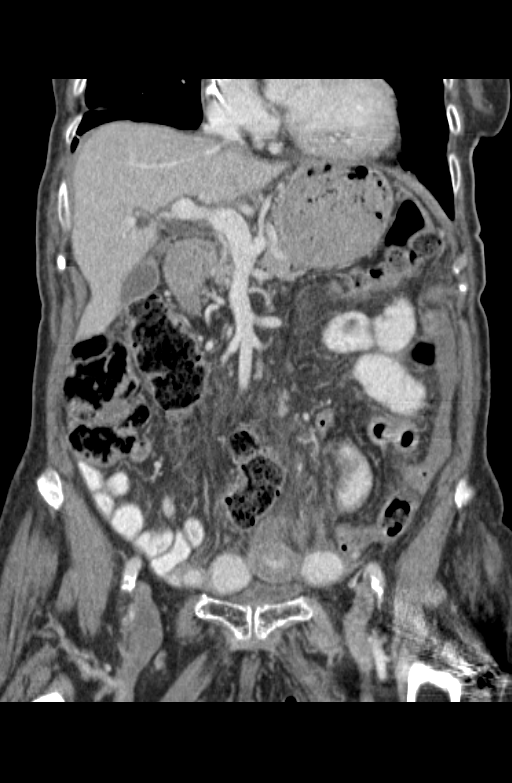
[im 50/112  soft-tissue]
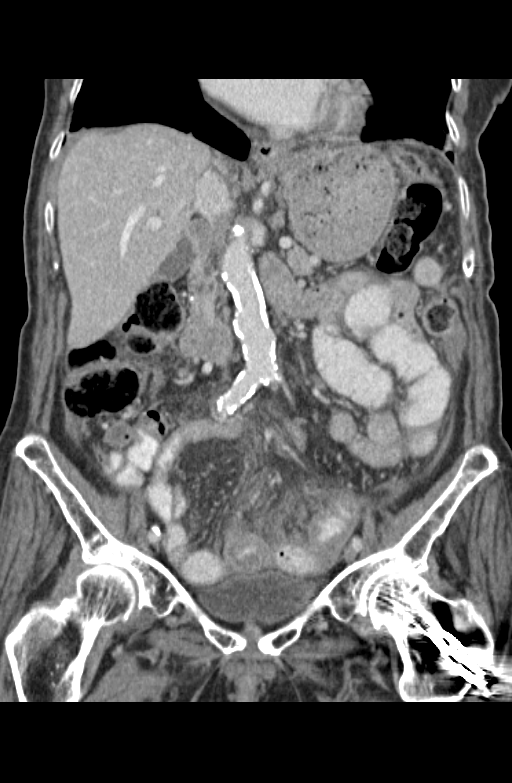
[im 62/112  soft-tissue]
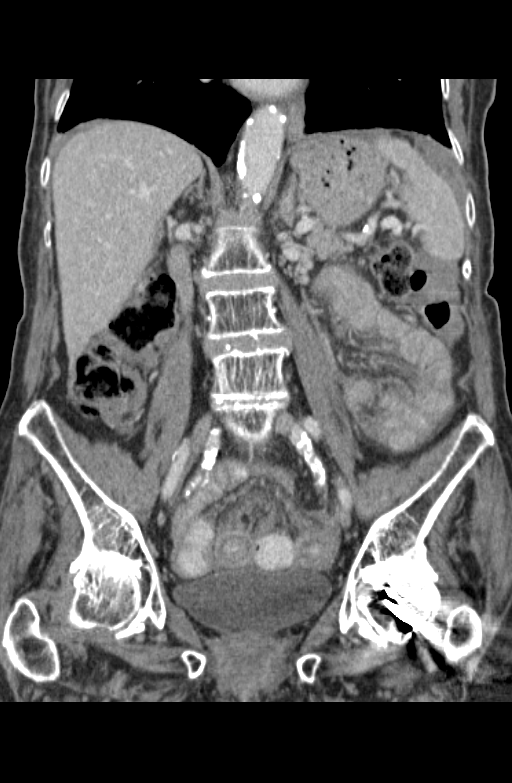

[13 of 46 positions shown; findings below may reference images not displayed]

FINDINGS: There is a moderate amount of high-density fluid compatible with
blood within the pelvic cul-de-sac extending along the bilateral
pericolic gutters to extend to involve the inferior aspect of the
liver and spleen.   There is a very subtle contour abnormality
within the peripheral aspect of the spleen (image 16.  No
definitive evidence of contrast extravasation at this location.

There is a short segment loop of small bowel within the midline of
the lower pelvis (axial images 56 through 58) which demonstrates
wall thickening.  There is minimal similar inflammatory change with
an additional short segment of small bowel within the left hemi
pelvis (image 56, series 2).  Neither of these loops of bowel
result in enteric obstruction.  No pneumoperitoneum, pneumatosis or
portal venous gas.

Extensive atherosclerotic calcifications of a normal caliber
abdominal aorta.  The proximal aspects of the major branch vessels
of the abdominal aorta, including the IMA, appear patent on this
non CTA examination.  Normal appearance of the retrocecal appendix.

Normal hepatic contour.  Note is made of an approximately 1.2 cm
hypoattenuating (11 HU) cyst within the anterior segment right lobe
of the liver (image 17, series 2).  There is an additional
approximately 1.5 cm cyst within the posterior segment right lobe
of the liver (image 13).  Additional smaller sub centimeter
hypoattenuating hepatic lesions are too small to accurately
characterize the favored to represent additional hepatic cysts.
Normal appearance of the gallbladder.  No definite intra or
extrahepatic biliary ductal dilatation.

There is a serpiginous hypoattenuating structure regional to the
pancreatic head (coronal images 45 through 48), measuring
approximately 1.1 x 2.2 cm in greatest oblique axial dimension
(axial image 26, series 2).  This structure is without associated
pancreatic ductal dilatation.  There is age appropriate pancreatic
atrophy.  Scattered calcifications within the spleen, sequela of
prior granulomas infection.

There is symmetric enhancement and excretion of the bilateral
kidneys.  Incidental note is made of a right-sided parapelvic cyst.
Additional bilateral hypoattenuating lesions are too small to
accurately characterize the favored to represent additional renal
cysts.  No urinary obstruction. No definite renal stones.  No
urinary obstruction or perinephric stranding.  Normal appearance of
the bilateral adrenal glands.

Limited visualization lower thorax is negative for focal airspace
opacity.  Cardiomegaly.  Pacer leads seen within the right atrium
and ventricle.  Calcifications within the papillary musculature of
the left ventricle.  No pericardial effusion.

Age indeterminate moderate (approximately 50%) compression
deformity of the L1 vertebral body.  No retropulsion of the
posterior elements.  Moderate to severe multilevel lumbar spine
degenerative change, worse at L4 - L5.  Post pinning of the left
femoral neck.
IMPRESSION: 1.  Mild to moderate amounts of hemoperitoneum, the etiology of
which is indeterminate on this examination.  Differential
considerations include possible splenic laceration as there is a
subtle contour abnormality within the peripheral aspect of the
spleen (however this is without definitive contrast extravasation).
Another etiology are two short segment abnormal loops of small
bowel within the midline and left lateral aspect of the pelvis.
Given the geographic involvement of the loops of small bowel as
well as extensive atherosclerotic disease within the abdominal
aorta, mesenteric ischemia is a possible differential possibility.

2.  Serpiginous hypoattenuating structure regional to the
pancreatic head may represent a side branch IPMA.  This finding is
without associated pancreatic ductal dilatation or peripancreatic
stranding.  Comparison to prior examinations (if available) is
recommended.  If no comparisons exist, further evaluation with non
emergent pancreatic protocol MRI may be performed as clinically
indicated.
3.  Age indeterminate moderate (approximately 50%) compression
deformity of the L1 vertebral body.

Above findings discussed with Kam, Kasper at [phone].

## 2014-10-26 ENCOUNTER — Encounter: Payer: Self-pay | Admitting: Podiatry

## 2014-10-26 ENCOUNTER — Ambulatory Visit (INDEPENDENT_AMBULATORY_CARE_PROVIDER_SITE_OTHER): Payer: Medicare Other | Admitting: Podiatry

## 2014-10-26 VITALS — BP 160/86 | HR 76 | Resp 18

## 2014-10-26 DIAGNOSIS — M205X9 Other deformities of toe(s) (acquired), unspecified foot: Secondary | ICD-10-CM | POA: Diagnosis not present

## 2014-10-26 DIAGNOSIS — B351 Tinea unguium: Secondary | ICD-10-CM

## 2014-10-26 DIAGNOSIS — Q828 Other specified congenital malformations of skin: Secondary | ICD-10-CM

## 2014-10-26 DIAGNOSIS — M79676 Pain in unspecified toe(s): Secondary | ICD-10-CM | POA: Diagnosis not present

## 2014-10-26 DIAGNOSIS — L84 Corns and callosities: Secondary | ICD-10-CM

## 2014-10-26 NOTE — Progress Notes (Signed)
   Subjective:    Patient ID: Rachel Shah, female    DOB: 01/09/1911, 81 y.o.   MRN: [id number]  HPI 81 year old female presents the office today with her granddaughter concerns of corns/warts to her foot. She states that ongoing for several years and they do not cause much discomfort. She previously has had pedicures done however due to the concern for warts they recommended follow-up. Denies any redness or drainage coming from the area. There is no pain to the area. She said no prior treatment otherwise. No other complaints at this time.  Review of Systems  HENT: Positive for hearing loss.   Cardiovascular: Positive for leg swelling.  Endocrine:       Excessive thirst   Musculoskeletal: Positive for gait problem.  Neurological: Positive for tremors.  All other systems reviewed and are negative.      Objective:   Physical Exam AAO 3, NAD; difficulty hearing DP/PT pulses are palpable, CRT less than 3 seconds. There is chronic bilateral lower extremity ankle/foot edema. There is no scissoring erythema or increase in warmth. Protective sensation intact with Derrel Nip monofilament, vibratory sensation decreased There is adductovarus of bilateral fifth digits with associated hyperkeratotic lesions in the dorsal lateral aspect of the digit at the PIPJ level. Upon debridement there is no underlying ulceration or evidence of verruca at this time. Right foot submetatarsal one annular, thick hyperkeratotic lesion. Upon debridement the underlying skin was intact and there is no evidence of ulceration. There is no pinpoint bleeding or evidence of verruca. Nails appear to be a mildly hypertrophic, dystrophic, brittle particularly the hallux nails. There is no tenderness to palpation around the nails and there is no swelling erythema or drainage. No other areas of edema, erythema, increase in warmth bilaterally. No other areas of tenderness bilaterally. MMT 4/5, ROM WNL No pain with  calf compression, erythema, warmth.     Assessment & Plan:  81 year old female with bilateral fifth toe corns, right submetatarsal 1 porokeratosis  -Treatment options discussed including all alternatives, risks, and complications -Hyperkeratotic lesion sub-debrided 3 without complication/bleeding. Offloading pads were dispensed.  - Hallux nails were debrided without complication/bleeding. The lesser digit nails appear to recently been cut and she states that her daughter cuts them. -Daily foot inspection discussed -Follow-up in 3 months or sooner if any problems arise. In the meantime, encouraged to call the office with any questions, concerns, change in symptoms. Follow-up with PCP for other issues mentioned in the review of systems.  Celesta Gentile, DPM

## 2014-12-13 ENCOUNTER — Other Ambulatory Visit: Payer: Self-pay | Admitting: Internal Medicine

## 2014-12-16 ENCOUNTER — Other Ambulatory Visit: Payer: Self-pay | Admitting: Internal Medicine

## 2015-01-25 ENCOUNTER — Encounter: Payer: Self-pay | Admitting: Podiatry

## 2015-01-25 ENCOUNTER — Ambulatory Visit (INDEPENDENT_AMBULATORY_CARE_PROVIDER_SITE_OTHER): Payer: Medicare Other | Admitting: Podiatry

## 2015-01-25 DIAGNOSIS — L84 Corns and callosities: Secondary | ICD-10-CM

## 2015-01-25 DIAGNOSIS — Q828 Other specified congenital malformations of skin: Secondary | ICD-10-CM

## 2015-01-25 DIAGNOSIS — M79676 Pain in unspecified toe(s): Secondary | ICD-10-CM

## 2015-01-25 DIAGNOSIS — B351 Tinea unguium: Secondary | ICD-10-CM | POA: Diagnosis not present

## 2015-01-26 NOTE — Progress Notes (Signed)
Patient ID: Rachel Shah, female   DOB: Oct 18, 1910, 81 y.o.   MRN: [id number]  Subjective: 81 y.o. returns the office today for painful, elongated, thickened toenails which she cannot trim herself. She also has painful calluses to both of her feet which become more symptomatic with weightbearing. Denies any redness or drainage around the nails/calluses. Denies any acute changes since last appointment and no new complaints today. Denies any systemic complaints such as fevers, chills, nausea, vomiting.   Objective: AAO 3, NAD DP/PT pulses palpable, CRT less than 3 seconds Because of injury chronic lower extremity edema.  Nails hypertrophic, dystrophic, elongated, brittle, discolored 10. There is tenderness overlying the nails 1-5 bilaterally. There is no surrounding erythema or drainage along the nail sites. Hyperkeratotic lesion right submetatarsal one and bilateral fifth digit. Upon debridement no underlying ulceration, drainage or other signs of infection. No open lesions or pre-ulcerative lesions are identified. No other areas of tenderness bilateral lower extremities. No overlying edema, erythema, increased warmth. hammertoe deformities are present.  No pain with calf compression, warmth, erythema.  Assessment: Patient presents with symptomatic onychomycosis; porokertosis  Plan: -Treatment options including alternatives, risks, complications were discussed -Nails sharply debrided 10 without complication/bleeding. -Hyperkeratotic lesions debrided 3 without complication/bleeding  -Discussed daily foot inspection. If there are any changes, to call the office immediately.  -Follow-up in 3 months or sooner if any problems are to arise. In the meantime, encouraged to call the office with any questions, concerns, changes symptoms.  Celesta Gentile, DPM

## 2015-03-06 ENCOUNTER — Other Ambulatory Visit: Payer: Self-pay | Admitting: Family Medicine

## 2015-03-20 ENCOUNTER — Ambulatory Visit (INDEPENDENT_AMBULATORY_CARE_PROVIDER_SITE_OTHER): Payer: Medicare Other | Admitting: Internal Medicine

## 2015-03-20 ENCOUNTER — Encounter: Payer: Self-pay | Admitting: Internal Medicine

## 2015-03-20 VITALS — BP 148/82 | HR 64 | Ht 60.0 in | Wt 134.0 lb

## 2015-03-20 DIAGNOSIS — Z950 Presence of cardiac pacemaker: Secondary | ICD-10-CM

## 2015-03-20 DIAGNOSIS — I480 Paroxysmal atrial fibrillation: Secondary | ICD-10-CM

## 2015-03-20 DIAGNOSIS — I495 Sick sinus syndrome: Secondary | ICD-10-CM | POA: Diagnosis not present

## 2015-03-20 LAB — CUP PACEART INCLINIC DEVICE CHECK
Battery Voltage: 2.99 V
Brady Statistic AP VP Percent: 0.85 %
Brady Statistic AP VS Percent: 88.64 %
Brady Statistic AS VP Percent: 0.13 %
Brady Statistic AS VS Percent: 10.38 %
Brady Statistic RA Percent Paced: 89.49 %
Brady Statistic RV Percent Paced: 0.97 %
Date Time Interrogation Session: 20170221145800
Implantable Lead Implant Date: 20120523
Implantable Lead Location: 753859
Implantable Lead Model: 1944
Implantable Lead Model: 1948
Lead Channel Impedance Value: 464 Ohm
Lead Channel Impedance Value: 592 Ohm
Lead Channel Pacing Threshold Amplitude: 0.5 V
Lead Channel Pacing Threshold Pulse Width: 0.4 ms
Lead Channel Sensing Intrinsic Amplitude: 2.542 mV
Lead Channel Setting Pacing Amplitude: 2 V
Lead Channel Setting Pacing Amplitude: 2.5 V
Lead Channel Setting Pacing Pulse Width: 0.4 ms
MDC IDC LEAD IMPLANT DT: 20120523
MDC IDC LEAD LOCATION: 753860
MDC IDC MSMT LEADCHNL RV PACING THRESHOLD AMPLITUDE: 0.5 V
MDC IDC MSMT LEADCHNL RV PACING THRESHOLD PULSEWIDTH: 0.4 ms
MDC IDC MSMT LEADCHNL RV SENSING INTR AMPL: 16.558 mV
MDC IDC SET LEADCHNL RV SENSING SENSITIVITY: 0.9 mV

## 2015-03-20 NOTE — Patient Instructions (Signed)
Medication Instructions: - Your physician recommends that you continue on your current medications as directed. Please refer to the Current Medication list given to you today.  Labwork: - none  Procedures/Testing: - none  Follow-Up: - Your physician wants you to follow-up in: 6 months with the [facility] 1 year with Dr. Caryl Comes. You will receive a reminder letter in the mail two months in advance. If you don't receive a letter, please call our office to schedule the follow-up appointment.  Any Additional Special Instructions Will Be Listed Below (If Applicable).     If you need a refill on your cardiac medications before your next appointment, please call your pharmacy.

## 2015-03-20 NOTE — Progress Notes (Signed)
Patient Care Team: [name], MD as PCP - General (Family Medicine)   HPI  Rachel Shah is a 82 y.o. female In followup for SVT and atrial fibrillation. She has a history of HFpEF and tachybradycardia syndrome. Status post pacemaker implantation. Thromboembolic risk factors are notable for age-31 hypertension-1 gender-1 heart failure-1 and TIA-2 her CHADS-VASc score 7.   Echo in 5/12 demonstrated moderate LVH, EF 55-60%, mild AI, mild-moderate LAE, lipomatous hypertrophy of the atrial septum   She has had no symptomatic recurrent supraventricular tachycardia  Anticoagulation was discontinued after hemoperitoneum 11/13   She is doing relatively well. She continues to have problems with peripheral edema; this is stable  Past Medical History  Diagnosis Date  . Atrial fibrillation ([place])   . Supraventricular tachycardia ([place])   . Tachy-brady syndrome (H[facility])     post Medtronic revealed pulse generator dual lead pacemaker  . Chronic diastolic heart failure (H[facility])     a. hx of diastolic CHF;  b. dx with systolic CHF [id number] in [place], [place];   c. admx to [facility] 09/2011  in [place], [place] => s/p bilat thoracentesis with neg cytology;   d. Echo 9/13:  mod LAE, mild LVH, EF 30-35%, ant septal severe HK, Gr 2 diast dysfn, mild reduced RVSF, mild MR, Ao sclerosis without AS, mild to mod TR, RVSP 51, mild to mod PR;  e. Echo 11/13: EF 60%  . Transient ischemic attack 2007  . Thrombocytopenia (HCC)     Mild  . Abnormal thyroid function test     Mildly abnormal thyroid function studies with a TSH of 7.984 (upper limit of normal 4.5), free T4 within normal limits at 1.21 and T3  slightly low at 69.2 (lower limit of normal 80), follow up with primary care  . Hypertension   . High cholesterol   . Lower GI bleeding   . Hard of hearing   . DVT (deep venous thrombosis) ([place]) 09/2011    partially occlusive right peroneal vein DVT by [place] at [facility] in [place], [place]  .  Osteoporosis     presumed as on actonel and compression deformities noted on CXR  . Nontraumatic hemoperitoneum 11/2011    with hemorrhagic shock from coumadin induced coagulopathy so stopped  . Fibroids     hx  . CVA (cerebral infarction) 09/2011    evidence found on latest eye exam with R hemianopsia per ophtho  . Generalized muscle weakness 03/2014    working with PT at rehab    Past Surgical History  Procedure Laterality Date  . Cataract extraction w/ intraocular lens implant  2012    left  . Dilation and curettage of uterus    . Cardiac pacemaker placement  2012    MDT dual chamber PPM implanted for tachy-brady syndrome  . Left hip fracture  1987    pins  . Cataract extraction Bilateral     Current Outpatient Prescriptions  Medication Sig Dispense Refill  . diltiazem (CARDIZEM CD) 240 MG 24 hr capsule TAKE 1 CAPSULE (240 MG TOTAL) BY MOUTH DAILY. 30 capsule 3  . diltiazem (CARDIZEM CD) 240 MG 24 hr capsule TAKE 1 CAPSULE (240 MG TOTAL) BY MOUTH DAILY. 30 capsule 3  . furosemide (LASIX) 20 MG tablet Take one tablet daily **MUST HAVE FOLLOW UP FOR FURTHER REFILLS** 30 tablet 0  . metoprolol tartrate (LOPRESSOR) 25 MG tablet TAKE 1 TABLET BY MOUTH 2 TIMES DAILY. ( NEEDS APPT. WITH DOCTOR FOR FUTURE  REFILLS) 60 tablet 3   No current facility-administered medications for this visit.    Allergies  Allergen Reactions  . Plavix [Clopidogrel Bisulfate]     STOMACH UPSET  . Coumadin [Warfarin Sodium] Other (See Comments)    Caused internal bleeding    Review of Systems negative except from HPI and PMH  Physical Exam BP 148/82 mmHg  Pulse 64  Ht 5' (1.524 m)  Wt 134 lb (60.782 kg)  BMI 26.17 kg/m2 Well developed and well nourished in no acute distress HENT normal E scleral and icterus clear Neck Supple Clear to ausculation Device pocket well healed; without hematoma or erythema.  There is no tethering  Regular rate and rhythm, 2/6 murmur sinsnge S2  Soft with active  bowel sounds No clubbing cyanosis 2+ Edema Alert and oriented, grossly normal motor and sensory function Skin Warm and Dry tremor  ECG demonstrates atrial pacing at 62 Intervals 28/11/41 Axis leftward at -56  Assessment and  Plan  Atrial fibrillation-paroxysmal  HFpEF  Murmur  Presumed AS  Pacemaker-Medtronic  The patient's device was interrogated.  The information was reviewed. No changes were made in the programming.      She has chronic lower extremity edema. I suggested that she think about wrapping. She declined.  No significant intercurrent atrial arrhythmias.  Prob prog  Aortic stenosis   n further evaluation    Pacemaker has about 3 more years of longevity? We think.

## 2015-03-27 ENCOUNTER — Telehealth: Payer: Self-pay | Admitting: Internal Medicine

## 2015-03-27 NOTE — Telephone Encounter (Signed)
She wants to know if pt can wear the Alert necklace with her pacemaker?

## 2015-03-28 NOTE — Telephone Encounter (Signed)
Informed pt caregiver that it is ok for pt to wear alert necklace.

## 2015-04-01 ENCOUNTER — Other Ambulatory Visit: Payer: Self-pay | Admitting: Family Medicine

## 2015-04-12 ENCOUNTER — Other Ambulatory Visit: Payer: Self-pay | Admitting: Internal Medicine

## 2015-04-26 ENCOUNTER — Ambulatory Visit: Payer: Medicare Other | Admitting: Podiatry

## 2015-04-28 ENCOUNTER — Other Ambulatory Visit: Payer: Self-pay | Admitting: Family Medicine

## 2015-05-03 ENCOUNTER — Ambulatory Visit (INDEPENDENT_AMBULATORY_CARE_PROVIDER_SITE_OTHER): Payer: Medicare Other | Admitting: Podiatry

## 2015-05-03 ENCOUNTER — Encounter: Payer: Self-pay | Admitting: Podiatry

## 2015-05-03 DIAGNOSIS — M79676 Pain in unspecified toe(s): Secondary | ICD-10-CM | POA: Diagnosis not present

## 2015-05-03 DIAGNOSIS — B351 Tinea unguium: Secondary | ICD-10-CM | POA: Diagnosis not present

## 2015-05-03 DIAGNOSIS — L84 Corns and callosities: Secondary | ICD-10-CM

## 2015-05-03 NOTE — Progress Notes (Signed)
Patient ID: Rachel Shah, female   DOB: 1910-03-22, 82 y.o.   MRN: [id number]  Subjective: 82 y.o. returns the office today for painful, elongated, thickened toenails which she cannot trim herself. She also has painful calluses to both of her feet. Denies any redness or drainage around the nails/calluses. Denies any acute changes since last appointment and no new complaints today. Denies any systemic complaints such as fevers, chills, nausea, vomiting.   Objective: AAO 3, NAD DP/PT pulses palpable, CRT less than 3 seconds Because of injury chronic lower extremity edema.  Nails hypertrophic, dystrophic, elongated, brittle, discolored 10. There is tenderness overlying the nails 1-5 bilaterally. There is no surrounding erythema or drainage along the nail sites. Hyperkeratotic lesion right submetatarsal one and bilateral fifth digit. Upon debridement no underlying ulceration, drainage or other signs of infection. No open lesions or pre-ulcerative lesions are identified. No other areas of tenderness bilateral lower extremities. No overlying edema, erythema, increased warmth. hammertoe deformities are present.  No pain with calf compression, warmth, erythema.  Assessment: Patient presents with symptomatic onychomycosis; porokertosis  Plan: -Treatment options including alternatives, risks, complications were discussed -Nails sharply debrided 10 without complication/bleeding. -Hyperkeratotic lesions debrided 3 without complication/bleeding  -Discussed daily foot inspection. If there are any changes, to call the office immediately.  -Follow-up in 3 months or sooner if any problems are to arise. In the meantime, encouraged to call the office with any questions, concerns, changes symptoms.  Celesta Gentile, DPM

## 2015-05-30 ENCOUNTER — Other Ambulatory Visit: Payer: Self-pay | Admitting: Family Medicine

## 2015-06-15 ENCOUNTER — Ambulatory Visit (INDEPENDENT_AMBULATORY_CARE_PROVIDER_SITE_OTHER): Payer: Medicare Other | Admitting: Family Medicine

## 2015-06-15 ENCOUNTER — Encounter: Payer: Self-pay | Admitting: Family Medicine

## 2015-06-15 VITALS — BP 142/70 | HR 70 | Temp 97.4°F | Wt 132.2 lb

## 2015-06-15 DIAGNOSIS — R251 Tremor, unspecified: Secondary | ICD-10-CM | POA: Diagnosis not present

## 2015-06-15 DIAGNOSIS — R600 Localized edema: Secondary | ICD-10-CM

## 2015-06-15 DIAGNOSIS — R413 Other amnesia: Secondary | ICD-10-CM

## 2015-06-15 DIAGNOSIS — I5032 Chronic diastolic (congestive) heart failure: Secondary | ICD-10-CM

## 2015-06-15 DIAGNOSIS — N289 Disorder of kidney and ureter, unspecified: Secondary | ICD-10-CM

## 2015-06-15 DIAGNOSIS — I495 Sick sinus syndrome: Secondary | ICD-10-CM

## 2015-06-15 DIAGNOSIS — I10 Essential (primary) hypertension: Secondary | ICD-10-CM

## 2015-06-15 DIAGNOSIS — I480 Paroxysmal atrial fibrillation: Secondary | ICD-10-CM

## 2015-06-15 LAB — RENAL FUNCTION PANEL
ALBUMIN: 4.2 g/dL (ref 3.6–5.1)
BUN: 25 mg/dL (ref 7–25)
CHLORIDE: 105 mmol/L (ref 98–110)
CO2: 29 mmol/L (ref 20–31)
CREATININE: 0.93 mg/dL — AB (ref 0.60–0.88)
Calcium: 9.3 mg/dL (ref 8.6–10.4)
Glucose, Bld: 92 mg/dL (ref 65–99)
Phosphorus: 3.8 mg/dL (ref 2.1–4.3)
Potassium: 4.7 mmol/L (ref 3.5–5.3)
SODIUM: 142 mmol/L (ref 135–146)

## 2015-06-15 LAB — CBC WITH DIFFERENTIAL/PLATELET
BASOS ABS: 0 {cells}/uL (ref 0–200)
Basophils Relative: 0 %
EOS PCT: 2 %
Eosinophils Absolute: 140 cells/uL (ref 15–500)
HCT: 42.2 % (ref 35.0–45.0)
Hemoglobin: 14.2 g/dL (ref 11.7–15.5)
Lymphocytes Relative: 19 %
Lymphs Abs: 1330 cells/uL (ref 850–3900)
MCH: 31.6 pg (ref 27.0–33.0)
MCHC: 33.6 g/dL (ref 32.0–36.0)
MCV: 93.8 fL (ref 80.0–100.0)
MONOS PCT: 7 %
MPV: 11.7 fL (ref 7.5–12.5)
Monocytes Absolute: 490 cells/uL (ref 200–950)
NEUTROS ABS: 5040 {cells}/uL (ref 1500–7800)
Neutrophils Relative %: 72 %
PLATELETS: 214 10*3/uL (ref 140–400)
RBC: 4.5 MIL/uL (ref 3.80–5.10)
RDW: 15.2 % — ABNORMAL HIGH (ref 11.0–15.0)
WBC: 7 10*3/uL (ref 3.8–10.8)

## 2015-06-15 MED ORDER — FUROSEMIDE 20 MG PO TABS: ORAL_TABLET | ORAL | Status: DC

## 2015-06-15 MED ORDER — FUROSEMIDE 20 MG PO TABS: ORAL_TABLET | ORAL | Status: AC

## 2015-06-15 MED ORDER — DILTIAZEM HCL ER COATED BEADS 240 MG PO CP24: ORAL_CAPSULE | ORAL | Status: AC

## 2015-06-15 MED ORDER — METOPROLOL TARTRATE 25 MG PO TABS: 25.0000 mg | ORAL_TABLET | Freq: Every day | ORAL | Status: AC

## 2015-06-15 MED ORDER — METOPROLOL TARTRATE 25 MG PO TABS: 25.0000 mg | ORAL_TABLET | Freq: Every day | ORAL | Status: DC

## 2015-06-15 NOTE — Assessment & Plan Note (Signed)
Overall euvolemic, with chronic pedal edema.

## 2015-06-15 NOTE — Patient Instructions (Signed)
Labs today.  Start b12 tablet 1069mcg daily.  Return in 6 months for follow up. Nice to see you today.

## 2015-06-15 NOTE — Progress Notes (Addendum)
BP 142/70 mmHg  Pulse 70  Temp(Src) 97.4 F (36.3 C) (Oral)  Wt 132 lb 4 oz (59.988 kg)  SpO2 98%   CC: med refill visit, bruising/bleeding Subjective:    Patient ID: Rachel Shah, female    DOB: 1910/11/02, 82 y.o.   MRN: [id number]  HPI: Rachel Shah is a 82 y.o. female presenting on 06/15/2015 for Follow-up; Medication Refill; Bleeding/Bruising; and Hip Pain   Here with daughter for med refill visit. Lives in [facility] living facility.  Chronic diastolic CHF with atrial fibrillation. Does not want to take any blood thinners/anticoagulants. Stays worried about easy bruising mostly on skin of arms.   Sees Dr Caryl Comes yearly for pacemaker check.   Relevant past medical, surgical, family and social history reviewed and updated as indicated. Interim medical history since our last visit reviewed. Allergies and medications reviewed and updated. No current outpatient prescriptions on file prior to visit.   No current facility-administered medications on file prior to visit.    Review of Systems Per HPI unless specifically indicated in ROS section     Objective:    BP 142/70 mmHg  Pulse 70  Temp(Src) 97.4 F (36.3 C) (Oral)  Wt 132 lb 4 oz (59.988 kg)  SpO2 98%  Wt Readings from Last 3 Encounters:  06/15/15 132 lb 4 oz (59.988 kg)  03/20/15 134 lb (60.782 kg)  09/06/14 137 lb (62.143 kg)    Physical Exam  Constitutional: She appears well-developed and well-nourished. No distress.  HENT:  Mouth/Throat: Oropharynx is clear and moist. No oropharyngeal exudate.  Cardiovascular: Normal rate, regular rhythm and intact distal pulses.   Murmur (3/6 systolic at LUSB) heard. Pulmonary/Chest: Effort normal and breath sounds normal. No respiratory distress. She has no wheezes. She has no rales.  Musculoskeletal: She exhibits edema (1+ pedal edema).  Skin: Skin is warm and dry.  Ecchymosis bilateral forearms  Nursing note and vitals reviewed.      Assessment &  Plan:  Over 25 minutes were spent face-to-face with the patient during this encounter and >50% of that time was spent on counseling and coordination of care  Problem List Items Addressed This Visit    HTN (hypertension) - Primary    Chronic, adequate control. Taking cardizem 240mg  daily + metoprolol 25mg  once daily. Refuses to take metoprolol 25mg  bid - states this worsens bruising.      Relevant Medications   diltiazem (CARDIZEM CD) 240 MG 24 hr capsule   furosemide (LASIX) 20 MG tablet   metoprolol tartrate (LOPRESSOR) 25 MG tablet   Tachy-brady syndrome (HCC)    Appreciate cards/EP care of patient.      Relevant Medications   diltiazem (CARDIZEM CD) 240 MG 24 hr capsule   furosemide (LASIX) 20 MG tablet   metoprolol tartrate (LOPRESSOR) 25 MG tablet   Atrial fibrillation (HCC)   Relevant Medications   diltiazem (CARDIZEM CD) 240 MG 24 hr capsule   furosemide (LASIX) 20 MG tablet   metoprolol tartrate (LOPRESSOR) 25 MG tablet   Chronic diastolic heart failure (HCC)    Overall euvolemic, with chronic pedal edema.      Relevant Medications   diltiazem (CARDIZEM CD) 240 MG 24 hr capsule   furosemide (LASIX) 20 MG tablet   metoprolol tartrate (LOPRESSOR) 25 MG tablet   Tremor    Head titubations.  Did not try gabapentin. Prior PCP told her any medication for tremor would be "very strong" so pt declines any treatment of this.  Pedal edema    1+ on exam. Continue lasix 20mg  daily. Pt declines compression wraps.      Memory impairment    rec start b12 1051mcg daily. Check levels today.      Relevant Orders   Vitamin B12    Other Visit Diagnoses    Renal insufficiency        Relevant Orders    CBC with Differential/Platelet    Renal function panel        Follow up plan: Return in about 6 months (around 12/16/2015), or as needed, for follow up visit.  [name], MD

## 2015-06-15 NOTE — Assessment & Plan Note (Signed)
Head titubations.  Did not try gabapentin. Prior PCP told her any medication for tremor would be "very strong" so pt declines any treatment of this.

## 2015-06-15 NOTE — Assessment & Plan Note (Signed)
rec start b12 102mcg daily. Check levels today.

## 2015-06-15 NOTE — Assessment & Plan Note (Signed)
Appreciate cards EP care of patient.  

## 2015-06-15 NOTE — Progress Notes (Signed)
Pre visit review using our clinic review tool, if applicable. No additional management support is needed unless otherwise documented below in the visit note. 

## 2015-06-15 NOTE — Assessment & Plan Note (Addendum)
1+ on exam. Continue lasix 20mg  daily. Pt declines compression wraps.

## 2015-06-15 NOTE — Assessment & Plan Note (Signed)
Chronic, adequate control. Taking cardizem 240mg  daily + metoprolol 25mg  once daily. Refuses to take metoprolol 25mg  bid - states this worsens bruising.

## 2015-06-16 LAB — VITAMIN B12: VITAMIN B 12: 261 pg/mL (ref 200–1100)

## 2015-06-19 ENCOUNTER — Telehealth: Payer: Self-pay | Admitting: Cardiology

## 2015-06-19 ENCOUNTER — Encounter: Payer: Medicare Other | Admitting: *Deleted

## 2015-06-19 NOTE — Telephone Encounter (Signed)
LMOVM reminding pt to send remote transmission.   

## 2015-06-22 ENCOUNTER — Encounter: Payer: Self-pay | Admitting: Cardiology

## 2015-06-26 ENCOUNTER — Other Ambulatory Visit: Payer: Self-pay | Admitting: Internal Medicine

## 2015-06-26 ENCOUNTER — Other Ambulatory Visit: Payer: Self-pay | Admitting: Family Medicine

## 2015-08-09 ENCOUNTER — Ambulatory Visit: Payer: Medicare Other | Admitting: Podiatry

## 2015-08-28 ENCOUNTER — Ambulatory Visit (INDEPENDENT_AMBULATORY_CARE_PROVIDER_SITE_OTHER): Payer: Medicare Other | Admitting: Podiatry

## 2015-08-28 DIAGNOSIS — Q828 Other specified congenital malformations of skin: Secondary | ICD-10-CM | POA: Diagnosis not present

## 2015-08-28 DIAGNOSIS — B351 Tinea unguium: Secondary | ICD-10-CM

## 2015-08-28 DIAGNOSIS — M79676 Pain in unspecified toe(s): Secondary | ICD-10-CM

## 2015-08-31 NOTE — Progress Notes (Signed)
Patient ID: Rachel Shah, female   DOB: Jul 08, 1910, 82 y.o.   MRN: [id number]  Subjective: 82 y.o. returns the office today for painful, elongated, thickened toenails which she cannot trim herself. She also has painful calluses to both of her feet. Denies any redness or drainage around the nails/calluses. Denies any acute changes since last appointment and no new complaints today. Denies any systemic complaints such as fevers, chills, nausea, vomiting.   Objective: AAO 3, NAD DP/PT pulses palpable, CRT less than 3 seconds Because of injury chronic lower extremity edema.  Nails hypertrophic, dystrophic, elongated, brittle, discolored 10. There is tenderness overlying the nails 1-5 bilaterally. There is no surrounding erythema or drainage along the nail sites. Hyperkeratotic lesion right submetatarsal one and bilateral fifth digit. Upon debridement no underlying ulceration, drainage or other signs of infection. No open lesions or pre-ulcerative lesions are identified. No pain with calf compression, warmth, erythema.  Assessment: Patient presents with symptomatic onychomycosis; porokertosis  Plan: -Treatment options including alternatives, risks, complications were discussed -Nails sharply debrided 10 without complication/bleeding. -Hyperkeratotic lesions debrided 3 without complication/bleeding  -Discussed daily foot inspection. If there are any changes, to call the office immediately.  -Follow-up in 3 months or sooner if any problems are to arise. In the meantime, encouraged to call the office with any questions, concerns, changes symptoms.  Celesta Gentile, DPM

## 2015-10-25 NOTE — Progress Notes (Signed)
This encounter was created in error - please disregard.

## 2015-11-29 ENCOUNTER — Ambulatory Visit (INDEPENDENT_AMBULATORY_CARE_PROVIDER_SITE_OTHER): Payer: Medicare Other | Admitting: Podiatry

## 2015-11-29 VITALS — BP 161/85 | HR 63 | Resp 18

## 2015-11-29 DIAGNOSIS — M79676 Pain in unspecified toe(s): Secondary | ICD-10-CM

## 2015-11-29 DIAGNOSIS — Q828 Other specified congenital malformations of skin: Secondary | ICD-10-CM

## 2015-11-29 DIAGNOSIS — B351 Tinea unguium: Secondary | ICD-10-CM

## 2015-12-05 NOTE — Progress Notes (Signed)
Patient ID: Rachel Shah, female   DOB: 12/27/1910, 82 y.o.   MRN: [id number]  Subjective: 82 y.o. returns the office today for painful, elongated, thickened toenails which she cannot trim herself. She also has painful calluses to her feet. Denies any redness or drainage around the nails/calluses. Denies any acute changes since last appointment and no new complaints today. Denies any systemic complaints such as fevers, chills, nausea, vomiting.   Objective: AAO 3, NAD DP/PT pulses palpable, CRT less than 3 seconds Because of injury chronic lower extremity edema.  Nails hypertrophic, dystrophic, elongated, brittle, discolored 10. There is tenderness overlying the nails 1-5 bilaterally. There is no surrounding erythema or drainage along the nail sites. Hyperkeratotic lesion right submetatarsal one and bilateral fifth digit. Upon debridement no underlying ulceration, drainage or other signs of infection. No open lesions or pre-ulcerative lesions are identified. No pain with calf compression, warmth, erythema.  Assessment: Patient presents with symptomatic onychomycosis; porokertosis  Plan: -Treatment options including alternatives, risks, complications were discussed -Nails sharply debrided 10 without complication/bleeding. -Hyperkeratotic lesions debrided 3 without complication/bleeding  -Discussed daily foot inspection. If there are any changes, to call the office immediately.  -Follow-up in 3 months or sooner if any problems are to arise. In the meantime, encouraged to call the office with any questions, concerns, changes symptoms.  Celesta Gentile, DPM

## 2016-01-14 ENCOUNTER — Encounter: Payer: Self-pay | Admitting: Emergency Medicine

## 2016-01-14 ENCOUNTER — Telehealth: Payer: Self-pay | Admitting: Internal Medicine

## 2016-01-14 ENCOUNTER — Emergency Department: Payer: Medicare Other

## 2016-01-14 ENCOUNTER — Inpatient Hospital Stay
Admission: EM | Admit: 2016-01-14 | Discharge: 2016-01-28 | DRG: 291 | Disposition: E | Payer: Medicare Other | Attending: Internal Medicine | Admitting: Internal Medicine

## 2016-01-14 DIAGNOSIS — Z961 Presence of intraocular lens: Secondary | ICD-10-CM | POA: Diagnosis present

## 2016-01-14 DIAGNOSIS — R262 Difficulty in walking, not elsewhere classified: Secondary | ICD-10-CM

## 2016-01-14 DIAGNOSIS — Z515 Encounter for palliative care: Secondary | ICD-10-CM

## 2016-01-14 DIAGNOSIS — I110 Hypertensive heart disease with heart failure: Principal | ICD-10-CM | POA: Diagnosis present

## 2016-01-14 DIAGNOSIS — Z66 Do not resuscitate: Secondary | ICD-10-CM | POA: Diagnosis present

## 2016-01-14 DIAGNOSIS — Z950 Presence of cardiac pacemaker: Secondary | ICD-10-CM | POA: Diagnosis not present

## 2016-01-14 DIAGNOSIS — Z888 Allergy status to other drugs, medicaments and biological substances status: Secondary | ICD-10-CM | POA: Diagnosis not present

## 2016-01-14 DIAGNOSIS — Z8673 Personal history of transient ischemic attack (TIA), and cerebral infarction without residual deficits: Secondary | ICD-10-CM | POA: Diagnosis not present

## 2016-01-14 DIAGNOSIS — J9601 Acute respiratory failure with hypoxia: Secondary | ICD-10-CM | POA: Diagnosis present

## 2016-01-14 DIAGNOSIS — H9190 Unspecified hearing loss, unspecified ear: Secondary | ICD-10-CM | POA: Diagnosis present

## 2016-01-14 DIAGNOSIS — Z86718 Personal history of other venous thrombosis and embolism: Secondary | ICD-10-CM

## 2016-01-14 DIAGNOSIS — R0602 Shortness of breath: Secondary | ICD-10-CM

## 2016-01-14 DIAGNOSIS — I471 Supraventricular tachycardia: Secondary | ICD-10-CM | POA: Diagnosis not present

## 2016-01-14 DIAGNOSIS — R0600 Dyspnea, unspecified: Secondary | ICD-10-CM | POA: Diagnosis not present

## 2016-01-14 DIAGNOSIS — F410 Panic disorder [episodic paroxysmal anxiety] without agoraphobia: Secondary | ICD-10-CM | POA: Diagnosis present

## 2016-01-14 DIAGNOSIS — I4891 Unspecified atrial fibrillation: Secondary | ICD-10-CM | POA: Diagnosis present

## 2016-01-14 DIAGNOSIS — Z7189 Other specified counseling: Secondary | ICD-10-CM | POA: Diagnosis not present

## 2016-01-14 DIAGNOSIS — R197 Diarrhea, unspecified: Secondary | ICD-10-CM | POA: Diagnosis present

## 2016-01-14 DIAGNOSIS — J849 Interstitial pulmonary disease, unspecified: Secondary | ICD-10-CM | POA: Diagnosis present

## 2016-01-14 DIAGNOSIS — I509 Heart failure, unspecified: Secondary | ICD-10-CM | POA: Diagnosis not present

## 2016-01-14 DIAGNOSIS — F411 Generalized anxiety disorder: Secondary | ICD-10-CM

## 2016-01-14 DIAGNOSIS — Z880 Allergy status to penicillin: Secondary | ICD-10-CM | POA: Diagnosis not present

## 2016-01-14 DIAGNOSIS — M810 Age-related osteoporosis without current pathological fracture: Secondary | ICD-10-CM | POA: Diagnosis present

## 2016-01-14 DIAGNOSIS — E876 Hypokalemia: Secondary | ICD-10-CM | POA: Diagnosis present

## 2016-01-14 DIAGNOSIS — Z79899 Other long term (current) drug therapy: Secondary | ICD-10-CM

## 2016-01-14 DIAGNOSIS — I5043 Acute on chronic combined systolic (congestive) and diastolic (congestive) heart failure: Secondary | ICD-10-CM | POA: Diagnosis present

## 2016-01-14 DIAGNOSIS — M6281 Muscle weakness (generalized): Secondary | ICD-10-CM

## 2016-01-14 DIAGNOSIS — R7989 Other specified abnormal findings of blood chemistry: Secondary | ICD-10-CM

## 2016-01-14 LAB — CBC WITH DIFFERENTIAL/PLATELET
Basophils Absolute: 0 K/uL (ref 0–0.1)
Basophils Relative: 0 %
Eosinophils Absolute: 0 K/uL (ref 0–0.7)
Eosinophils Relative: 1 %
HCT: 37.4 % (ref 35.0–47.0)
Hemoglobin: 12.9 g/dL (ref 12.0–16.0)
Lymphocytes Relative: 14 %
Lymphs Abs: 0.6 K/uL — ABNORMAL LOW (ref 1.0–3.6)
MCH: 32.2 pg (ref 26.0–34.0)
MCHC: 34.5 g/dL (ref 32.0–36.0)
MCV: 93.4 fL (ref 80.0–100.0)
Monocytes Absolute: 0.6 K/uL (ref 0.2–0.9)
Monocytes Relative: 14 %
Neutro Abs: 2.9 K/uL (ref 1.4–6.5)
Neutrophils Relative %: 71 %
Platelets: 152 K/uL (ref 150–440)
RBC: 4 MIL/uL (ref 3.80–5.20)
RDW: 15.5 % — ABNORMAL HIGH (ref 11.5–14.5)
WBC: 4.1 K/uL (ref 3.6–11.0)

## 2016-01-14 LAB — COMPREHENSIVE METABOLIC PANEL WITH GFR
ALT: 18 U/L (ref 14–54)
AST: 31 U/L (ref 15–41)
Albumin: 3.1 g/dL — ABNORMAL LOW (ref 3.5–5.0)
Alkaline Phosphatase: 40 U/L (ref 38–126)
Anion gap: 6 (ref 5–15)
BUN: 23 mg/dL — ABNORMAL HIGH (ref 6–20)
CO2: 25 mmol/L (ref 22–32)
Calcium: 8.4 mg/dL — ABNORMAL LOW (ref 8.9–10.3)
Chloride: 105 mmol/L (ref 101–111)
Creatinine, Ser: 1.03 mg/dL — ABNORMAL HIGH (ref 0.44–1.00)
GFR calc Af Amer: 49 mL/min — ABNORMAL LOW
GFR calc non Af Amer: 42 mL/min — ABNORMAL LOW
Glucose, Bld: 120 mg/dL — ABNORMAL HIGH (ref 65–99)
Potassium: 3.1 mmol/L — ABNORMAL LOW (ref 3.5–5.1)
Sodium: 136 mmol/L (ref 135–145)
Total Bilirubin: 0.6 mg/dL (ref 0.3–1.2)
Total Protein: 5.7 g/dL — ABNORMAL LOW (ref 6.5–8.1)

## 2016-01-14 LAB — GASTROINTESTINAL PANEL BY PCR, STOOL (REPLACES STOOL CULTURE)

## 2016-01-14 LAB — URINALYSIS, ROUTINE W REFLEX MICROSCOPIC
Bacteria, UA: NONE SEEN
Bilirubin Urine: NEGATIVE
Glucose, UA: NEGATIVE mg/dL
Hgb urine dipstick: NEGATIVE
Ketones, ur: NEGATIVE mg/dL
Leukocytes, UA: NEGATIVE
Nitrite: NEGATIVE
Protein, ur: 30 mg/dL — AB
Specific Gravity, Urine: 1.023 (ref 1.005–1.030)
Squamous Epithelial / HPF: NONE SEEN
pH: 5 (ref 5.0–8.0)

## 2016-01-14 LAB — C DIFFICILE QUICK SCREEN W PCR REFLEX
C Diff antigen: NEGATIVE
C Diff interpretation: NOT DETECTED
C Diff toxin: NEGATIVE

## 2016-01-14 LAB — MAGNESIUM
Magnesium: <0.1 mg/dL — CL (ref 1.7–2.4)
Magnesium: 3.5 mg/dL — ABNORMAL HIGH (ref 1.7–2.4)

## 2016-01-14 LAB — TROPONIN I: Troponin I: 0.03 ng/mL

## 2016-01-14 LAB — MRSA PCR SCREENING: MRSA BY PCR: NEGATIVE

## 2016-01-14 LAB — LIPASE, BLOOD: Lipase: 41 U/L (ref 11–51)

## 2016-01-14 LAB — BRAIN NATRIURETIC PEPTIDE: B Natriuretic Peptide: 233 pg/mL — ABNORMAL HIGH (ref 0.0–100.0)

## 2016-01-14 MED ORDER — MAGNESIUM SULFATE 4 GM/100ML IV SOLN
4.0000 g | Freq: Once | INTRAVENOUS | Status: AC
  Administered 2016-01-14: 4 g via INTRAVENOUS
  Filled 2016-01-14: qty 100

## 2016-01-14 MED ORDER — SODIUM CHLORIDE 0.9 % IV SOLN
30.0000 meq | Freq: Once | INTRAVENOUS | Status: DC
  Filled 2016-01-14: qty 15

## 2016-01-14 MED ORDER — ACETAMINOPHEN 650 MG RE SUPP: 650.0000 mg | Freq: Four times a day (QID) | RECTAL | Status: DC | PRN

## 2016-01-14 MED ORDER — ONDANSETRON HCL 4 MG/2ML IJ SOLN: 4.0000 mg | Freq: Four times a day (QID) | INTRAMUSCULAR | Status: DC | PRN

## 2016-01-14 MED ORDER — POTASSIUM CHLORIDE 20 MEQ PO PACK
40.0000 meq | PACK | Freq: Once | ORAL | Status: AC
  Filled 2016-01-14: qty 2

## 2016-01-14 MED ORDER — LORAZEPAM 2 MG/ML IJ SOLN
INTRAMUSCULAR | Status: AC
  Administered 2016-01-14: 0.5 mg via INTRAVENOUS
  Filled 2016-01-14: qty 1

## 2016-01-14 MED ORDER — SODIUM CHLORIDE 0.9% FLUSH
3.0000 mL | Freq: Two times a day (BID) | INTRAVENOUS | Status: DC
  Administered 2016-01-14 – 2016-01-18 (×9): 3 mL via INTRAVENOUS

## 2016-01-14 MED ORDER — LORAZEPAM 0.5 MG PO TABS
0.5000 mg | ORAL_TABLET | Freq: Four times a day (QID) | ORAL | Status: AC | PRN
  Administered 2016-01-14 – 2016-01-18 (×4): 0.5 mg via ORAL
  Filled 2016-01-14 (×4): qty 1

## 2016-01-14 MED ORDER — ENOXAPARIN SODIUM 30 MG/0.3ML ~~LOC~~ SOLN
30.0000 mg | SUBCUTANEOUS | Status: DC
  Administered 2016-01-14 – 2016-01-17 (×4): 30 mg via SUBCUTANEOUS
  Filled 2016-01-14 (×4): qty 0.3

## 2016-01-14 MED ORDER — METOPROLOL TARTRATE 25 MG PO TABS
25.0000 mg | ORAL_TABLET | Freq: Every day | ORAL | Status: DC
  Administered 2016-01-15 – 2016-01-18 (×4): 25 mg via ORAL
  Filled 2016-01-14 (×4): qty 1

## 2016-01-14 MED ORDER — MORPHINE SULFATE (CONCENTRATE) 10 MG/0.5ML PO SOLN: 5.0000 mg | ORAL | Status: DC | PRN

## 2016-01-14 MED ORDER — LORAZEPAM 2 MG/ML IJ SOLN
0.5000 mg | Freq: Once | INTRAMUSCULAR | Status: AC
  Administered 2016-01-14: 0.5 mg via INTRAVENOUS

## 2016-01-14 MED ORDER — IPRATROPIUM-ALBUTEROL 0.5-2.5 (3) MG/3ML IN SOLN
3.0000 mL | RESPIRATORY_TRACT | Status: DC
  Administered 2016-01-14 – 2016-01-17 (×13): 3 mL via RESPIRATORY_TRACT
  Filled 2016-01-14 (×14): qty 3

## 2016-01-14 MED ORDER — FUROSEMIDE 40 MG PO TABS
20.0000 mg | ORAL_TABLET | Freq: Every day | ORAL | Status: DC
  Administered 2016-01-15 – 2016-01-16 (×2): 20 mg via ORAL
  Filled 2016-01-14: qty 1
  Filled 2016-01-14: qty 2

## 2016-01-14 MED ORDER — POTASSIUM CHLORIDE 20 MEQ PO PACK
40.0000 meq | PACK | ORAL | Status: AC
  Administered 2016-01-14: 40 meq via ORAL
  Filled 2016-01-14: qty 2

## 2016-01-14 MED ORDER — ONDANSETRON HCL 4 MG PO TABS: 4.0000 mg | ORAL_TABLET | Freq: Four times a day (QID) | ORAL | Status: DC | PRN

## 2016-01-14 MED ORDER — ACETAMINOPHEN 325 MG PO TABS
650.0000 mg | ORAL_TABLET | Freq: Four times a day (QID) | ORAL | Status: DC | PRN
  Administered 2016-01-15: 650 mg via ORAL
  Filled 2016-01-14: qty 2

## 2016-01-14 MED ORDER — DILTIAZEM HCL ER COATED BEADS 240 MG PO CP24
240.0000 mg | ORAL_CAPSULE | Freq: Every day | ORAL | Status: DC
  Administered 2016-01-15 – 2016-01-18 (×4): 240 mg via ORAL
  Filled 2016-01-14 (×4): qty 1

## 2016-01-14 NOTE — Telephone Encounter (Signed)
Daughter is calling into the office to inform Nira Conn RN and Dr Caryl Comes, that she will be taking the pt to [facility] for possible pneumonia.  Daughter reports that the pt has a very wet and productive cough, and sounds very congested.  Daughter reports that at times the pt coughs so much, it makes it difficult for her to catch her breath.  Daughter states that being her mom is a cardiac pt of Dr Olin Pia, she wanted to make him aware of this.  Daughter states the pt feels warm, but hasn't taken her temperature at this time.   Daughter states the pt is weak as well.  Informed the pts Daughter that I agree with her plan with taking the pt to the ER for further eval of possible pneumonia. Informed the pts Daughter that if she has a difficult time in transporting the pt to the hospital by herself, then she could request for a non-emergent ambulance to assist with transporting the pt safely to the ER.  Informed the pts Daughter that I will route this message to both Dr Caryl Comes and Nira Conn, to make them aware that the pt will be going to [facility] ER, for complaints mentioned.  Daughter verbalized understanding and gracious for all the assistance provided.

## 2016-01-14 NOTE — ED Provider Notes (Signed)
[facility] Emergency Department Provider Note  ____________________________________________   First MD Initiated Contact with Patient 01/03/2016 1413     (approximate)  I have reviewed the triage vital signs and the nursing notes.   HISTORY  Chief Complaint Nasal Congestion; Cough; and Diarrhea    HPI Rachel Shah is a 82 y.o. female who lives in an independent living facility and is brought by EMS for evaluation of congestion, cough, shortness of breath, all of which have been gradually worsening over about the last week.  Additionally she has had copious nearly uncontrollable diarrhea for the last 2 days.  She has no recent history of antibiotic use.  She denies fever/chills, chest pain, abdominal pain, nausea, vomiting, dysuria.  She has audible wheezingwithout a stethoscope auscultation and frequently coughs and clears her throat.  She has slightly increased work of breathing but denies any acute respiratory distress.   Past Medical History:  Diagnosis Date  . Abnormal thyroid function test    Mildly abnormal thyroid function studies with a TSH of 7.984 (upper limit of normal 4.5), free T4 within normal limits at 1.21 and T3  slightly low at 69.2 (lower limit of normal 80), follow up with primary care  . Atrial fibrillation ([place])   . Chronic diastolic heart failure (HCC)    a. hx of diastolic CHF;  b. dx with systolic CHF [id number] in [place], [place];   c. admx to [facility] 09/2011  in [place], [place] => s/p bilat thoracentesis with neg cytology;   d. Echo 9/13:  mod LAE, mild LVH, EF 30-35%, ant septal severe HK, Gr 2 diast dysfn, mild reduced RVSF, mild MR, Ao sclerosis without AS, mild to mod TR, RVSP 51, mild to mod PR;  e. Echo 11/13: EF 60%  . CVA (cerebral infarction) 09/2011   evidence found on latest eye exam with R hemianopsia per ophtho  . DVT (deep venous thrombosis) ([place]) 09/2011   partially occlusive right peroneal vein DVT by [place] at [facility] in [place], [place]  . Fibroids    hx  . Generalized muscle weakness 03/2014   working with PT at rehab  . Hard of hearing   . High cholesterol   . Hypertension   . Lower GI bleeding   . Nontraumatic hemoperitoneum 11/2011   with hemorrhagic shock from coumadin induced coagulopathy so stopped  . Osteoporosis    presumed as on actonel and compression deformities noted on CXR  . Supraventricular tachycardia ([place])   . Tachy-brady syndrome (HCC)    post [employer] revealed pulse generator dual lead pacemaker  . Thrombocytopenia (HCC)    Mild  . Transient ischemic attack 2007    Patient Active Problem List   Diagnosis Date Noted  . Hypomagnesemia 01/21/2016  . Memory impairment 02/04/2014  . Advanced care planning/counseling discussion 02/03/2014  . Pedal edema 02/03/2014  . Tremor 04/21/2013  . Osteoporosis   . SVT (supraventricular tachycardia) ([place]) 12/03/2011  . Chronic diastolic heart failure ([place]) 11/10/2011  . Pacemaker-MDT 06/26/2011  . HTN (hypertension) 12/16/2010  . Tachy-brady syndrome ([place]) 12/16/2010  . Atrial fibrillation ([place]) 12/16/2010    Past Surgical History:  Procedure Laterality Date  . CARDIAC PACEMAKER PLACEMENT  2012   MDT dual chamber PPM implanted for tachy-brady syndrome  . CATARACT EXTRACTION Bilateral   . CATARACT EXTRACTION W/ INTRAOCULAR LENS IMPLANT  2012   left  . DILATION AND CURETTAGE OF UTERUS    . left hip fracture  1987  pins    Prior to Admission medications   Medication Sig Start Date End Date Taking? Authorizing Provider  diltiazem (CARDIZEM CD) 240 MG 24 hr capsule TAKE 1 CAPSULE (240 MG TOTAL) BY MOUTH DAILY. 06/15/15  Yes [name], MD  furosemide (LASIX) 20 MG tablet TAKE ONE TABLET DAILY 06/15/15  Yes [name], MD  metoprolol tartrate (LOPRESSOR) 25 MG tablet Take 1 tablet (25 mg total) by mouth daily. 06/15/15  Yes [name], MD    Allergies Plavix [clopidogrel bisulfate]; Coumadin [warfarin  sodium]; and Penicillins  Family History  Problem Relation Age of Onset  . Cancer Sister   . CAD Neg Hx     Social History Social History  Substance Use Topics  . Smoking status: Never Smoker  . Smokeless tobacco: Never Used  . Alcohol use No    Review of Systems Constitutional: No fever/chills Eyes: No visual changes. ENT: No sore throat. Cardiovascular: Denies chest pain. Respiratory: +shortness of breath, +wheezing, +cough Gastrointestinal: No abdominal pain.  No nausea, no vomiting.  +diarrhea.  No constipation. Genitourinary: Negative for dysuria. Musculoskeletal: Negative for back pain. Skin: Negative for rash. Neurological: Negative for headaches, focal weakness or numbness.  10-point ROS otherwise negative.  ____________________________________________   PHYSICAL EXAM:  VITAL SIGNS: ED Triage Vitals  Enc Vitals Group     BP 01/03/2016 1330 (!) 177/62     Pulse Rate 01/04/2016 1330 62     Resp 01/02/2016 1330 16     Temp 01/11/2016 1330 97.9 F (36.6 C)     Temp Source 12/29/2015 1330 Oral     SpO2 01/25/2016 1330 94 %     Weight 01/13/2016 1331 140 lb (63.5 kg)     Height 01/16/2016 1331 5\' 4"  (1.626 m)     Head Circumference --      Peak Flow --      Pain Score --      Pain Loc --      Pain Edu? --      Excl. in [place]? --     Constitutional: Alert and oriented. Generally well-appearing, appears younger than chronological age Eyes: Conjunctivae are normal. PERRL. EOMI. Head: Atraumatic. Nose: No congestion/rhinnorhea appreciated Mouth/Throat: Mucous membranes are moist.  Oropharynx non-erythematous. Neck: No stridor.  No meningeal signs.   Cardiovascular: Normal rate, regular rhythm. Good peripheral circulation. Grossly normal heart sounds. Respiratory: Expiratory wheezing with coarse breath sounds throughout.  Slightly increased work of breathing.  Speaks in full sentences.  Coughs forcefully when she takes a deep breath Gastrointestinal: Soft and nontender. No  distention.  Musculoskeletal: No lower extremity tenderness nor edema. No gross deformities of extremities. Neurologic:  Normal speech and language. No gross focal neurologic deficits are appreciated except for being hard of hearing. Skin:  Skin is warm, dry and intact. No rash noted.   ____________________________________________   LABS (all labs ordered are listed, but only abnormal results are displayed)  Labs Reviewed  CBC WITH DIFFERENTIAL/PLATELET - Abnormal; Notable for the following:       Result Value   RDW 15.5 (*)    Lymphs Abs 0.6 (*)    All other components within normal limits  COMPREHENSIVE METABOLIC PANEL - Abnormal; Notable for the following:    Potassium 3.1 (*)    Glucose, Bld 120 (*)    BUN 23 (*)    Creatinine, Ser 1.03 (*)    Calcium 8.4 (*)    Total Protein 5.7 (*)    Albumin 3.1 (*)  GFR calc non Af Amer 42 (*)    GFR calc Af Amer 49 (*)    All other components within normal limits  URINALYSIS, ROUTINE W REFLEX MICROSCOPIC - Abnormal; Notable for the following:    Color, Urine YELLOW (*)    APPearance CLEAR (*)    Protein, ur 30 (*)    All other components within normal limits  TROPONIN I - Abnormal; Notable for the following:    Troponin I 0.03 (*)    All other components within normal limits  BRAIN NATRIURETIC PEPTIDE - Abnormal; Notable for the following:    B Natriuretic Peptide 233.0 (*)    All other components within normal limits  MAGNESIUM - Abnormal; Notable for the following:    Magnesium <0.1 (*)    All other components within normal limits  GASTROINTESTINAL PANEL BY PCR, STOOL (REPLACES STOOL CULTURE)  C DIFFICILE QUICK SCREEN W PCR REFLEX  LIPASE, BLOOD  MAGNESIUM  MAGNESIUM   ____________________________________________  EKG  ED ECG REPORT I, Trynity Skousen, the attending physician, personally viewed and interpreted this ECG.  Date: 12/30/2015 EKG Time: 14:13 Rate: 63 Rhythm: Atrial paced rhythm with incomplete left  bundle branch block QRS Axis: LAD Intervals: normal ST/T Wave abnormalities: Non-specific ST segment / T-wave changes, but no evidence of acute ischemia. Conduction Disturbances: none Narrative Interpretation: Significant artifact and wandering baselines makes it somewhat difficult to assess  ____________________________________________  RADIOLOGY   Dg Chest Portable 1 View  Result Date: 01/18/2016 CLINICAL DATA:  Shortness of breath and wheezing with productive cough. EXAM: PORTABLE CHEST 1 VIEW COMPARISON:  01/30/2012 FINDINGS: 1510 hours. The cardio pericardial silhouette is enlarged. Interstitial markings are diffusely coarsened with chronic features. No focal airspace consolidation or overt airspace pulmonary edema. Left permanent pacemaker again noted. Telemetry leads overlie the chest. IMPRESSION: Cardiomegaly with chronic underlying interstitial lung disease. Electronically Signed   By: Misty Stanley M.D.   On: 12/30/2015 15:33    ____________________________________________   PROCEDURES  Procedure(s) performed:   Procedures   Critical Care performed: No ____________________________________________   INITIAL IMPRESSION / ASSESSMENT AND PLAN / ED COURSE  Pertinent labs & imaging results that were available during my care of the patient were reviewed by me and considered in my medical decision making (see chart for details).  The patient is extremely elderly but is generally well-appearing except for a slightly increased work of breathing.  I suspect that the wheezing and coarse breath sounds may be the result of pulmonary edema; she has a history of heart failure and her daughter states that they have been working with her on her fluid pill and she has not been taking it regularly recently.  We will check a BNP in addition to a chest x-ray.  I will reassess after the results of her labs are back.   (Note that documentation was delayed due to multiple ED patients  requiring immediate care.)   The patient has an undetectable magnesium level and hypokalemia at 3.1.  She has a slightly elevated BNP with no evidence of pulmonary edema on her chest x-ray but I am concerned that she has a next picture of electrolyte abnormalities due to diarrhea in addition to some volume overload leading to her respiratory distress and that the pulmonary edema simply is not yet showing up on her chest x-ray.  Given her age and severe electrolyte abnormalities I will admit her for further management.  I discussed the case with the hospitalist.  I ordered magnesium sulfate  4 g IV, potassium chloride 40 mg by mouth and the IV potassium replacement protocol.  Stool studies including GI pathogen panel and C. diff are pending at the time of admission.  ____________________________________________  FINAL CLINICAL IMPRESSION(S) / ED DIAGNOSES  Final diagnoses:  Hypomagnesemia  Hypokalemia, gastrointestinal losses  Shortness of breath  Elevated brain natriuretic peptide (BNP) level  Diarrhea, unspecified type     MEDICATIONS GIVEN DURING THIS VISIT:  Medications  magnesium sulfate IVPB 4 g 100 mL (4 g Intravenous New Bag/Given 01/01/2016 1621)  potassium chloride (KLOR-CON) packet 40 mEq (not administered)  potassium chloride (KLOR-CON) packet 40 mEq (40 mEq Oral Given 01/11/2016 1601)     NEW OUTPATIENT MEDICATIONS STARTED DURING THIS VISIT:  New Prescriptions   No medications on file    Modified Medications   No medications on file    Discontinued Medications   METOPROLOL TARTRATE (LOPRESSOR) 25 MG TABLET    TAKE 1 TABLET BY MOUTH 2 TIMES DAILY. ( NEEDS APPT. WITH DOCTOR FOR FUTURE REFILLS)     Note:  This document was prepared using Dragon voice recognition software and may include unintentional dictation errors.    Hinda Kehr, MD 01/06/2016 [phone]

## 2016-01-14 NOTE — ED Notes (Signed)
Cleaned and repositioned pt.

## 2016-01-14 NOTE — Progress Notes (Signed)
   [place] at [facility] Day: 0 days Rachel Shah is a 82 y.o. female presenting with Nasal Congestion; Cough; and Diarrhea . Noted to be in acute resp distress and wheezing.   Advance care planning discussed with patient and her daughter who is her HCPOA at bedside. All questions in regards to overall condition and expected prognosis answered. No prior code status available. Daughter said that she understands her Mothers' condition and would like to keep her DNR. Palliative care consult requested as well.  CODE STATUS: DNR Time spent: 18 minutes

## 2016-01-14 NOTE — ED Triage Notes (Signed)
Pt to ED via EMS with daughter from [facility] c/o congestion, cough, SOB, and diarrhea for 2 days.  Per daughter patient with diarrhea every time using the bathroom x2 days and SOB.  Pt presents alert, chest rise even and unlabored, end expiratory wheeze.  Hx of pacemaker in place.

## 2016-01-14 NOTE — Telephone Encounter (Signed)
New message  Pt daughter call requesting to speak with RN. Pt daughter states pt is having really bad congestion and would like to know if pt should go to the hospital now. Please call back

## 2016-01-14 NOTE — Progress Notes (Signed)
Anticoagulation monitoring(Lovenox):  82yo  female ordered Lovenox 40 mg Q24h  Filed Weights   01/06/2016 1331  Weight: 140 lb (63.5 kg)   BMI 23.9   Lab Results  Component Value Date   CREATININE 1.03 (H) 12/31/2015   CREATININE 0.93 (H) 06/15/2015   CREATININE 0.88 02/03/2014   Estimated Creatinine Clearance: 23.2 mL/min (by C-G formula based on SCr of 1.03 mg/dL (H)). Hemoglobin & Hematocrit     Component Value Date/Time   HGB 12.9 12/29/2015 1407   HCT 37.4 01/01/2016 1407     Per Protocol for Patient with estCrcl < 30 ml/min and BMI < 40, will transition to Lovenox 30 mg Q24h.

## 2016-01-14 NOTE — ED Notes (Signed)
Pt sleeping. BP 83/68, patient placed in trendelenburg position.  Simple oxygen mask placed on patient due to breathing through mouth.  BP 115/66 after position change.  Dr. Tressia Miners notified and to inform her if BP drops more.

## 2016-01-14 NOTE — H&P (Signed)
[facility] at [place] NAME: Rachel Shah    MR#:  [id number]  DATE OF BIRTH:  1910-03-24  DATE OF ADMISSION:  01/15/2016  PRIMARY CARE PHYSICIAN: [name], MD   REQUESTING/REFERRING PHYSICIAN: Dr. Hinda Kehr  CHIEF COMPLAINT:   Chief Complaint  Patient presents with  . Nasal Congestion  . Cough  . Diarrhea    HISTORY OF PRESENT ILLNESS:  Rachel Shah  is a 82 y.o. female with a known history of Chronic diastolic heart failure, atrial fibrillation status post pacemaker for tachybradycardia syndrome, history of stroke with right-sided hemianopsia, history of DVT, hearing loss, hypertension presents to Hospital from independent living secondary to worsening diarrhea and shortness of breath. Patient is very anxious during my exam and most of the history is obtained from her daughter at bedside. According to the daughter and patient has been having diarrhea since yesterday. She has some wheezing at baseline and some dyspnea at baseline. However her audible wheezing and dyspnea have been worsened this morning. Daughter went to check on the patient and she appeared very weak and tired and has had panic attacks last night. Her saturations are normal on room air, however very anxious and tachypneic at this time. Chest x-ray showing chronic interstitial changes and no pulmonary edema but has cardiomegaly. Continues to have ongoing diarrhea. Stool for C. difficile is pending. Her magnesium was noted to be undetectable and also had low potassium.  PAST MEDICAL HISTORY:   Past Medical History:  Diagnosis Date  . Abnormal thyroid function test    Mildly abnormal thyroid function studies with a TSH of 7.984 (upper limit of normal 4.5), free T4 within normal limits at 1.21 and T3  slightly low at 69.2 (lower limit of normal 80), follow up with primary care  . Atrial fibrillation ([place])   . Chronic diastolic heart failure (HCC)    a. hx  of diastolic CHF;  b. dx with systolic CHF [id number] in [place], [place];   c. admx to [facility] 09/2011  in [place], [place] => s/p bilat thoracentesis with neg cytology;   d. Echo 9/13:  mod LAE, mild LVH, EF 30-35%, ant septal severe HK, Gr 2 diast dysfn, mild reduced RVSF, mild MR, Ao sclerosis without AS, mild to mod TR, RVSP 51, mild to mod PR;  e. Echo 11/13: EF 60%  . CVA (cerebral infarction) 09/2011   evidence found on latest eye exam with R hemianopsia per ophtho  . DVT (deep venous thrombosis) ([place]) 09/2011   partially occlusive right peroneal vein DVT by [place] at [facility] in [place], [place]  . Fibroids    hx  . Generalized muscle weakness 03/2014   working with PT at rehab  . Hard of hearing   . High cholesterol   . Hypertension   . Lower GI bleeding   . Nontraumatic hemoperitoneum 11/2011   with hemorrhagic shock from coumadin induced coagulopathy so stopped  . Osteoporosis    presumed as on actonel and compression deformities noted on CXR  . Supraventricular tachycardia ([place])   . Tachy-brady syndrome (HCC)    post [employer] revealed pulse generator dual lead pacemaker  . Thrombocytopenia (HCC)    Mild  . Transient ischemic attack 2007    PAST SURGICAL HISTORY:   Past Surgical History:  Procedure Laterality Date  . CARDIAC PACEMAKER PLACEMENT  2012   MDT dual chamber PPM implanted for tachy-brady syndrome  . CATARACT EXTRACTION Bilateral   .  CATARACT EXTRACTION W/ INTRAOCULAR LENS IMPLANT  2012   left  . DILATION AND CURETTAGE OF UTERUS    . left hip fracture  1987   pins    SOCIAL HISTORY:   Social History  Substance Use Topics  . Smoking status: Never Smoker  . Smokeless tobacco: Never Used  . Alcohol use No    FAMILY HISTORY:   Family History  Problem Relation Age of Onset  . Cancer Sister   . CAD Neg Hx     DRUG ALLERGIES:   Allergies  Allergen Reactions  . Plavix [Clopidogrel Bisulfate]     STOMACH UPSET  . Coumadin [Warfarin Sodium] Other (See  Comments)    Caused internal bleeding  . Penicillins Other (See Comments)    Has patient had a PCN reaction causing immediate rash, facial/tongue/throat swelling, SOB or lightheadedness with hypotension: No Has patient had a PCN reaction causing severe rash involving mucus membranes or skin necrosis: No Has patient had a PCN reaction that required hospitalization No Has patient had a PCN reaction occurring within the last 10 years: No If all of the above answers are "NO", then may proceed with Cephalosporin use.     REVIEW OF SYSTEMS:   Review of Systems  Constitutional: Positive for malaise/fatigue. Negative for chills, fever and weight loss.  HENT: Positive for hearing loss. Negative for ear discharge, ear pain and nosebleeds.   Eyes: Negative for blurred vision, double vision and photophobia.  Respiratory: Positive for shortness of breath and wheezing. Negative for cough and hemoptysis.   Cardiovascular: Positive for orthopnea and leg swelling. Negative for chest pain and palpitations.  Gastrointestinal: Positive for diarrhea and nausea. Negative for abdominal pain, constipation, heartburn, melena and vomiting.  Genitourinary: Negative for dysuria and urgency.  Musculoskeletal: Positive for myalgias. Negative for back pain and neck pain.  Skin: Negative for rash.  Neurological: Negative for dizziness, tingling, tremors, sensory change, speech change, focal weakness and headaches.  Endo/Heme/Allergies: Does not bruise/bleed easily.  Psychiatric/Behavioral: Negative for depression. The patient is nervous/anxious.     MEDICATIONS AT HOME:   Prior to Admission medications   Medication Sig Start Date End Date Taking? Authorizing Provider  diltiazem (CARDIZEM CD) 240 MG 24 hr capsule TAKE 1 CAPSULE (240 MG TOTAL) BY MOUTH DAILY. 06/15/15  Yes [name], MD  furosemide (LASIX) 20 MG tablet TAKE ONE TABLET DAILY 06/15/15  Yes [name], MD  metoprolol tartrate (LOPRESSOR) 25  MG tablet Take 1 tablet (25 mg total) by mouth daily. 06/15/15  Yes [name], MD      VITAL SIGNS:  Blood pressure (!) 141/76, pulse 62, temperature 97.9 F (36.6 C), temperature source Oral, resp. rate 20, height 5\' 4"  (1.626 m), weight 63.5 kg (140 lb), SpO2 95 %.  PHYSICAL EXAMINATION:   Physical Exam  GENERAL:  82 y.o.-year-old elderly patient sitting in the bed, appears very anxious, audible wheezing and anxious.  EYES: Pupils equal, round, reactive to light and accommodation. No scleral icterus. Extraocular muscles intact.  HEENT: Head atraumatic, normocephalic. Oropharynx and nasopharynx clear.  NECK:  Supple, no jugular venous distention. No thyroid enlargement, no tenderness.  LUNGS: audible upper airway wheeze heard. Moving air bilaterally, no rales,rhonchi or crepitation. No use of accessory muscles of respiration.  CARDIOVASCULAR: S1, S2 normal. No rubs, or gallops. 3/6 systolic murmur present. ABDOMEN: Soft, nontender, nondistended. Bowel sounds present. No organomegaly or mass.  EXTREMITIES: No cyanosis, or clubbing. 1+ chronic pedal edema NEUROLOGIC: Cranial nerves II through XII are  intact. Muscle strength 5/5 in all extremities. Sensation intact. Gait not checked. Head tremors noted PSYCHIATRIC: The patient is alert and oriented x 3.  SKIN: No obvious rash, lesion, or ulcer.   LABORATORY PANEL:   CBC  Recent Labs Lab 01/13/2016 1407  WBC 4.1  HGB 12.9  HCT 37.4  PLT 152   ------------------------------------------------------------------------------------------------------------------  Chemistries   Recent Labs Lab 01/03/2016 1407  NA 136  K 3.1*  CL 105  CO2 25  GLUCOSE 120*  BUN 23*  CREATININE 1.03*  CALCIUM 8.4*  MG <0.1*  AST 31  ALT 18  ALKPHOS 40  BILITOT 0.6   ------------------------------------------------------------------------------------------------------------------  Cardiac Enzymes  Recent Labs Lab 12/29/2015 1407    TROPONINI 0.03*   ------------------------------------------------------------------------------------------------------------------  RADIOLOGY:  Dg Chest Portable 1 View  Result Date: 01/13/2016 CLINICAL DATA:  Shortness of breath and wheezing with productive cough. EXAM: PORTABLE CHEST 1 VIEW COMPARISON:  01/30/2012 FINDINGS: 1510 hours. The cardio pericardial silhouette is enlarged. Interstitial markings are diffusely coarsened with chronic features. No focal airspace consolidation or overt airspace pulmonary edema. Left permanent pacemaker again noted. Telemetry leads overlie the chest. IMPRESSION: Cardiomegaly with chronic underlying interstitial lung disease. Electronically Signed   By: Misty Stanley M.D.   On: 01/24/2016 15:33    EKG:   Orders placed or performed during the hospital encounter of 01/06/2016  . ED EKG  . ED EKG  . EKG 12-Lead  . EKG 12-Lead    IMPRESSION AND PLAN:   Rachel Shah  is a 82 y.o. female with a known history of Chronic diastolic heart failure, atrial fibrillation status post pacemaker for tachybradycardia syndrome, history of stroke with right-sided hemianopsia, history of DVT, hearing loss, hypertension presents to Hospital from independent living secondary to worsening diarrhea and shortness of breath.  #1 acute diarrhea- check c.diff and stool studies - If negative, will add imodium - monitor  #2 severe hypomagnesemia and hypokalemia-being replaced aggressively. Recheck later today and also tomorrow morning  #3 acute respiratory distress- interstitial lung disease, also from anxiety - on ativan, roxanol prn - prn o2, nebs, lasix - If not improving- will add steroids as well  #4 diastolic heart failure-has chronic lower extremity edema. Wheezing noted on exam. But chest x-ray with no significant congestion. -Start oral Lasix.  #5 atrial fibrillation-status post pacemaker for take a bradycardia syndrome. Patient follows with Dr.  Caryl Comes. Continue metoprolol and Cardizem. On aspirin for stroke prophylaxis  #6 DVT prophylaxis-on Lovenox   All the records are reviewed and case discussed with ED provider. Management plans discussed with the patient, family and they are in agreement.  CODE STATUS: DO NOT RESUSCITATE  TOTAL TIME TAKING CARE OF THIS PATIENT: 55 minutes.    Gladstone Lighter M.D on 01/08/2016 at 6:12 PM  Between 7am to 6pm - Pager - [phone]  After 6pm go to [email or web address] - password [email or web address] [facility]  Office  [phone]  CC: Primary care physician; [name], MD

## 2016-01-15 ENCOUNTER — Inpatient Hospital Stay: Payer: Medicare Other

## 2016-01-15 ENCOUNTER — Inpatient Hospital Stay (HOSPITAL_COMMUNITY)
Admit: 2016-01-15 | Discharge: 2016-01-15 | Disposition: A | Discharge status: Home / Self Care | Payer: Medicare Other | Attending: Internal Medicine | Admitting: Internal Medicine

## 2016-01-15 DIAGNOSIS — I509 Heart failure, unspecified: Secondary | ICD-10-CM

## 2016-01-15 LAB — MAGNESIUM: MAGNESIUM: 3 mg/dL — AB (ref 1.7–2.4)

## 2016-01-15 LAB — BASIC METABOLIC PANEL
ANION GAP: 9 (ref 5–15)
BUN: 25 mg/dL — ABNORMAL HIGH (ref 6–20)
CALCIUM: 8.3 mg/dL — AB (ref 8.9–10.3)
CO2: 24 mmol/L (ref 22–32)
CREATININE: 0.91 mg/dL (ref 0.44–1.00)
Chloride: 105 mmol/L (ref 101–111)
GFR, EST AFRICAN AMERICAN: 57 mL/min — AB (ref >60)
GFR, EST NON AFRICAN AMERICAN: 49 mL/min — AB (ref >60)
Glucose, Bld: 104 mg/dL — ABNORMAL HIGH (ref 65–99)
Potassium: 4 mmol/L (ref 3.5–5.1)
Sodium: 138 mmol/L (ref 135–145)

## 2016-01-15 LAB — CBC
HCT: 39.9 % (ref 35.0–47.0)
HEMOGLOBIN: 13.3 g/dL (ref 12.0–16.0)
MCH: 32 pg (ref 26.0–34.0)
MCHC: 33.3 g/dL (ref 32.0–36.0)
MCV: 95.9 fL (ref 80.0–100.0)
PLATELETS: 142 10*3/uL — AB (ref 150–440)
RBC: 4.16 MIL/uL (ref 3.80–5.20)
RDW: 15.8 % — ABNORMAL HIGH (ref 11.5–14.5)
WBC: 3.8 10*3/uL (ref 3.6–11.0)

## 2016-01-15 MED ORDER — LOPERAMIDE HCL 2 MG PO CAPS: 2.0000 mg | ORAL_CAPSULE | ORAL | Status: DC | PRN

## 2016-01-15 MED ORDER — FUROSEMIDE 10 MG/ML IJ SOLN
20.0000 mg | Freq: Once | INTRAMUSCULAR | Status: AC
  Administered 2016-01-15: 20 mg via INTRAVENOUS
  Filled 2016-01-15: qty 2

## 2016-01-15 MED ORDER — MORPHINE SULFATE (CONCENTRATE) 10 MG/0.5ML PO SOLN
2.5000 mg | ORAL | Status: DC | PRN
  Administered 2016-01-15 – 2016-01-16 (×4): 2.6 mg via ORAL
  Filled 2016-01-15 (×4): qty 1

## 2016-01-15 NOTE — Progress Notes (Signed)
   [place] at [facility] Hospital Day: 1 day Christian Hetzer is a 82 y.o. female presenting with Nasal Congestion; Cough; and Diarrhea .   Advance care planning discussed with patient  with additional Family at bedside. All questions in regards to overall condition and expected prognosis answered. The decision was made to continue current code status  CODE STATUS: dnr Time spent: 18 minutes   Spoke with patients daughter: overall condition and suspected prognosis. Palliative care had already been consulted  Family comfortable with low dose morphine for symptoms control - SOB, Will still check ECHO but daughter realistic that patient may ultimately die - if that is the situation she wants her mother to be as comfortable as possible

## 2016-01-15 NOTE — Progress Notes (Signed)
Pt arrived from ED alert and oriented x 3. Daughter at bedside, pt was oriented to self, time and place. Telemetry box verified with Suzzanne Cloud . No skin issues, verified with Suzzanne Cloud . No c/o pain offered. Pt on nasal cannula at 2L . No concerns offered at this time.

## 2016-01-15 NOTE — Progress Notes (Addendum)
[place] at [place] NAME: Rachel Shah    MRN#:  [id number]  DATE OF BIRTH:  [birth year]  SUBJECTIVE:  Hospital Day: 1 day Rachel Shah is a 82 y.o. female presenting with Nasal Congestion; Cough; and Diarrhea .   Overnight events: no overnight events Interval Events: called by nursing staff patient in respiratory distress Patient states "im rattley"   REVIEW OF SYSTEMS:  Unable to obtain given medical acuity   DRUG ALLERGIES:   Allergies  Allergen Reactions  . Plavix [Clopidogrel Bisulfate]     STOMACH UPSET  . Coumadin [Warfarin Sodium] Other (See Comments)    Caused internal bleeding  . Penicillins Other (See Comments)    Has patient had a PCN reaction causing immediate rash, facial/tongue/throat swelling, SOB or lightheadedness with hypotension: No Has patient had a PCN reaction causing severe rash involving mucus membranes or skin necrosis: No Has patient had a PCN reaction that required hospitalization No Has patient had a PCN reaction occurring within the last 10 years: No If all of the above answers are "NO", then may proceed with Cephalosporin use.     VITALS:  Blood pressure (!) 87/50, pulse 63, temperature 97.7 F (36.5 C), temperature source Oral, resp. rate 16, height 5\' 4"  (1.626 m), weight 63.5 kg (140 lb), SpO2 91 %.  PHYSICAL EXAMINATION:  VITAL SIGNS: Vitals:   01/15/16 0853 01/15/16 1307  BP: (!) 175/90 (!) 87/50  Pulse: (!) 101 63  Resp:  16  Temp:  97.7 F (36.5 C)   GENERAL:82 y.o.female  Respiratory distress HEAD: Normocephalic, atraumatic.  EYES: Pupils equal, round, reactive to light. Extraocular muscles intact. No scleral icterus.  MOUTH: Moist mucosal membrane. Dentition intact. No abscess noted.  EAR, NOSE, THROAT: Clear without exudates. No external lesions.  NECK: Supple. No thyromegaly. No nodules. No JVD.  PULMONARY: diffuse coarse rhonchi. No use of accessory muscles,  tachypnea  Good respiratory effort. good air entry bilaterally CHEST: Nontender to palpation.  CARDIOVASCULAR: S1 and S2. Regular rate and rhythm. No murmurs, rubs, or gallops. No edema. Pedal pulses 2+ bilaterally.  GASTROINTESTINAL: Soft, nontender, nondistended. No masses. Positive bowel sounds. No hepatosplenomegaly.  MUSCULOSKELETAL: No swelling, clubbing, or edema. Range of motion full in all extremities.  NEUROLOGIC: Cranial nerves II through XII are intact. No gross focal neurological deficits. Sensation intact. Reflexes intact.  SKIN: No ulceration, lesions, rashes, or cyanosis. Skin warm and dry. Turgor intact.  PSYCHIATRIC: Mood, affect within normal limits. The patient is awake     LABORATORY PANEL:   CBC  Recent Labs Lab 01/15/16 0320  WBC 3.8  HGB 13.3  HCT 39.9  PLT 142*   ------------------------------------------------------------------------------------------------------------------  Chemistries   Recent Labs Lab 01/07/2016 1407  01/15/16 0320  NA 136  --  138  K 3.1*  --  4.0  CL 105  --  105  CO2 25  --  24  GLUCOSE 120*  --  104*  BUN 23*  --  25*  CREATININE 1.03*  --  0.91  CALCIUM 8.4*  --  8.3*  MG <0.1*  < > 3.0*  AST 31  --   --   ALT 18  --   --   ALKPHOS 40  --   --   BILITOT 0.6  --   --   < > = values in this interval not displayed. ------------------------------------------------------------------------------------------------------------------  Cardiac Enzymes  Recent Labs Lab 01/18/2016 1407  TROPONINI 0.03*   ------------------------------------------------------------------------------------------------------------------  RADIOLOGY:  Dg Chest Port 1 View  Result Date: 01/15/2016 CLINICAL DATA:  Followup cough EXAM: PORTABLE CHEST 1 VIEW COMPARISON:  Yesterday FINDINGS: Progression of diffuse interstitial opacity. Small bilateral pleural effusions may be present. Cardiomegaly accentuated by rotation. Dual-chamber pacer leads  from the left in unremarkable position. No asymmetric consolidative opacity. No pneumothorax. IMPRESSION: Progressive interstitial opacities which could be congestive or infectious. Electronically Signed   By: Monte Fantasia M.D.   On: 01/15/2016 11:57   Dg Chest Portable 1 View  Result Date: 01/12/2016 CLINICAL DATA:  Shortness of breath and wheezing with productive cough. EXAM: PORTABLE CHEST 1 VIEW COMPARISON:  01/30/2012 FINDINGS: 1510 hours. The cardio pericardial silhouette is enlarged. Interstitial markings are diffusely coarsened with chronic features. No focal airspace consolidation or overt airspace pulmonary edema. Left permanent pacemaker again noted. Telemetry leads overlie the chest. IMPRESSION: Cardiomegaly with chronic underlying interstitial lung disease. Electronically Signed   By: Misty Stanley M.D.   On: 01/24/2016 15:33    EKG:   Orders placed or performed during the hospital encounter of 01/20/2016  . ED EKG  . ED EKG  . EKG 12-Lead  . EKG 12-Lead    ASSESSMENT AND PLAN:   Rachel Shah is a 82 y.o. female presenting with Nasal Congestion; Cough; and Diarrhea . Admitted 01/18/2016 : Day #: 1 day 1. Acute respiratory failure with hypoxia: acute on chronic diastolic chf== pulmonary edema, given lasix now, cxr - bedside read interstial infiltrate - likely pulmonary edema. Continue oxygen, breathing treatments, suspect CHF given edema, pulm edema, DOE, check echo  2. Hypomag: resolved 3. Hypok: resolved   All the records are reviewed and case discussed with Care Management/Social Workerr. Management plans discussed with the patient, family and they are in agreement.  CODE STATUS: dnr TOTAL TIME TAKING CARE OF THIS PATIENT: 33 critical minutes.   POSSIBLE D/C IN 2-3DAYS, DEPENDING ON CLINICAL CONDITION.   [name],  [name] on 01/15/2016 at 1:49 PM  Between 7am to 6pm - Pager - [phone]  After 6pm: House Pager: - [phone]  [name]  [facility]  Office  [phone]  CC: Primary care physician; [name], MD

## 2016-01-15 NOTE — Evaluation (Signed)
Physical Therapy Evaluation Patient Details Name: Rachel Shah MRN: [id number] DOB: 1910/10/23 Today's Date: 01/15/2016   History of Present Illness  presented to ER secondary to diarrhea, SOB; admitted with acute respiratory distress and hypomagnesia.  Clinical Impression  Upon evaluation, patient notably SOB at rest; however, requested assist with transition to edge of bed.  Patient globally weak and deconditioned, requiring min/mod assist for all functional activities.  Maintained unsupported sitting edge of bed (intermittently propping on UEs for assist with respiration) with close sup for 3-4 minutes, but with persistent SOB, use of accessory muscles to breathe.  Unsafe/unable to attempt further activity at this time.  Patient wished to remain in sitting edge of bed, but encouraged and assisted for return to supine for safety.  Will continue mobility progression and assessment as medically appropriate. Sats maintained >91% on supplemental O2 throughout session; however, SOB is significant barrier to mobility progression at this time. Would benefit from skilled PT to address above deficits and promote optimal return to PLOF; recommend transition to STR upon discharge from acute hospitalization as patient able to tolerate/participate.     Follow Up Recommendations SNF    Equipment Recommendations       Recommendations for Other Services       Precautions / Restrictions Precautions Precautions: Fall;ICD/Pacemaker Restrictions Weight Bearing Restrictions: No      Mobility  Bed Mobility Overal bed mobility: Needs Assistance Bed Mobility: Supine to Sit;Sit to Supine     Supine to sit: Min assist Sit to supine: Mod assist   General bed mobility comments: assist for LE management-tolerated x3-4 minutes; use of accessory muscles for breathing, encouraged for return to supine  Transfers                 General transfer comment: unsafe/unable to tolerate due to  significant SOB  Ambulation/Gait             General Gait Details: unsafe/unable to tolerate due to significant SOB  Stairs            Wheelchair Mobility    Modified Rankin (Stroke Patients Only)       Balance Overall balance assessment: Needs assistance Sitting-balance support: No upper extremity supported;Feet supported Sitting balance-Leahy Scale: Fair                                       Pertinent Vitals/Pain Pain Assessment: No/denies pain    Home Living Family/patient expects to be discharged to::  ([facility])                      Prior Function           Comments: Patient with significant difficulty answering specific questions due to [facility] and significant SOB with attempts at conversation     Hand Dominance        Extremity/Trunk Assessment   Upper Extremity Assessment Upper Extremity Assessment: Generalized weakness (grossly at least 3/5 throughout)    Lower Extremity Assessment Lower Extremity Assessment: Generalized weakness (grossly at least 3/5 throughout)       Communication   Communication: HOH  Cognition Arousal/Alertness: Awake/alert Behavior During Therapy: Restless Overall Cognitive Status: Difficult to assess                      General Comments      Exercises Other Exercises Other  Exercises: Unable to tolerate additional activity due to significant SOB   Assessment/Plan    PT Assessment Patient needs continued PT services  PT Problem List Decreased strength;Decreased range of motion;Decreased balance;Decreased activity tolerance;Decreased mobility;Decreased knowledge of use of DME;Decreased safety awareness;Decreased knowledge of precautions;Cardiopulmonary status limiting activity          PT Treatment Interventions DME instruction;Gait training;Functional mobility training;Therapeutic activities;Therapeutic exercise;Patient/family education    PT  Goals (Current goals can be found in the Care Plan section)  Acute Rehab PT Goals Patient Stated Goal: to sit on the edge of the bed PT Goal Formulation: With patient Time For Goal Achievement: 01/29/16 Potential to Achieve Goals: Fair Additional Goals Additional Goal #1: Assess and establish goals for OOB/gait as medically appropriate.    Frequency Min 2X/week   Barriers to discharge Decreased caregiver support      Co-evaluation               End of Session Equipment Utilized During Treatment: Oxygen Activity Tolerance: Treatment limited secondary to medical complications (Comment) (significant SOB with very minimal activity) Patient left: in bed;with call bell/phone within reach;with bed alarm set           Time: [id number] PT Time Calculation (min) (ACUTE ONLY): 16 min   Charges:   PT Evaluation $PT Eval Moderate Complexity: 1 Procedure     PT G Codes:        [facility] Commisso H. Owens Shark, PT, DPT, NCS 01/15/16, 1:51 PM [phone]

## 2016-01-15 NOTE — Care Management (Signed)
CM made attempt to speak with patient but she was sleeping.  Earlier this afternoon developed respiratory distress probably due to pulmonary edema.  Requiring nonrebreather 02 support. She is a resident at [facility].  Palliative care consult pending for goals of treatment.  Patient is DNR.  Physical therapy had recommended skilled nursing facility placement prior to the respiratory distress event.

## 2016-01-16 LAB — ECHOCARDIOGRAM COMPLETE
AO mean calculated velocity dopler: 213 cm/s
AOPV: 0.4 m/s
AOVTI: 66.1 cm
AV Area VTI index: 0.62 cm2/m2
AV Area VTI: 1.27 cm2
AV Mean grad: 19 mmHg
AV Peak grad: 29 mmHg
AV area mean vel ind: 0.62 cm2/m2
AVA: 1.06 cm2
AVAREAMEANV: 1.06 cm2
AVCELMEANRAT: 0.34
AVPKVEL: 268 cm/s
CHL CUP AV PEAK INDEX: 0.75
CHL CUP AV VALUE AREA INDEX: 0.62
CHL CUP AV VEL: 1.06
CHL CUP DOP CALC LVOT VTI: 22.3 cm
CHL CUP MV DEC (S): 285
E decel time: 285 msec
E/e' ratio: 38.32
FS: 28 % (ref 28–44)
HEIGHTINCHES: 64 in
IV/PV OW: 1.11
LA diam end sys: 50 mm
LADIAMINDEX: 2.94 cm/m2
LASIZE: 50 mm
LAVOLA4C: 109 mL
LDCA: 3.14 cm2
LV E/e' medial: 38.32
LV TDI E'LATERAL: 3.81
LVEEAVG: 38.32
LVELAT: 3.81 cm/s
LVOT SV: 70 mL
LVOT diameter: 20 mm
LVOTPV: 108 cm/s
LVOTVTI: 0.34 cm
Lateral S' vel: 9.57 cm/s
MV Peak grad: 9 mmHg
MVPKAVEL: 126 m/s
MVPKEVEL: 146 m/s
P 1/2 time: 354 ms
PW: 10.4 mm — AB (ref 0.6–1.1)
TAPSE: 17.9 mm
TDI e' medial: 3.15
WEIGHTICAEL: 2240 [oz_av]

## 2016-01-16 LAB — BASIC METABOLIC PANEL
ANION GAP: 7 (ref 5–15)
BUN: 28 mg/dL — ABNORMAL HIGH (ref 6–20)
CALCIUM: 8.5 mg/dL — AB (ref 8.9–10.3)
CO2: 25 mmol/L (ref 22–32)
CREATININE: 1.02 mg/dL — AB (ref 0.44–1.00)
Chloride: 103 mmol/L (ref 101–111)
GFR calc Af Amer: 49 mL/min — ABNORMAL LOW (ref >60)
GFR, EST NON AFRICAN AMERICAN: 43 mL/min — AB (ref >60)
Glucose, Bld: 104 mg/dL — ABNORMAL HIGH (ref 65–99)
Potassium: 4 mmol/L (ref 3.5–5.1)
SODIUM: 135 mmol/L (ref 135–145)

## 2016-01-16 MED ORDER — FUROSEMIDE 10 MG/ML IJ SOLN
20.0000 mg | Freq: Every day | INTRAMUSCULAR | Status: DC
  Administered 2016-01-17 – 2016-01-18 (×2): 20 mg via INTRAVENOUS
  Filled 2016-01-16 (×2): qty 2

## 2016-01-16 MED ORDER — MORPHINE SULFATE 10 MG/5ML PO SOLN: 5.0000 mg | ORAL | Status: DC | PRN

## 2016-01-16 MED ORDER — DILTIAZEM HCL 25 MG/5ML IV SOLN
INTRAVENOUS | Status: AC
  Filled 2016-01-16: qty 10

## 2016-01-16 MED ORDER — HALOPERIDOL LACTATE 5 MG/ML IJ SOLN
2.0000 mg | Freq: Once | INTRAMUSCULAR | Status: AC
  Administered 2016-01-16: 2 mg via INTRAVENOUS
  Filled 2016-01-16: qty 1

## 2016-01-16 MED ORDER — MORPHINE SULFATE (PF) 4 MG/ML IV SOLN
1.0000 mg | Freq: Once | INTRAVENOUS | Status: AC
  Administered 2016-01-16: 1 mg via INTRAVENOUS
  Filled 2016-01-16: qty 1

## 2016-01-16 MED ORDER — MORPHINE SULFATE (CONCENTRATE) 10 MG/0.5ML PO SOLN
5.0000 mg | ORAL | Status: DC | PRN
  Administered 2016-01-16 – 2016-01-18 (×5): 5 mg via ORAL
  Filled 2016-01-16 (×5): qty 1

## 2016-01-16 MED ORDER — DILTIAZEM HCL 25 MG/5ML IV SOLN: 10.0000 mg | Freq: Once | INTRAVENOUS | Status: DC

## 2016-01-16 NOTE — Progress Notes (Signed)
This NP attempted to meet with daughter to discuss [place] this afternoon. Per nursing, daughter just left the hospital and may not return till tomorrow. I have spoken with Dr. Lavetta Nielsen regarding Ms. Gotto and will follow-up with patient and family tomorrow, 01/17/16.  NO CHARGE  Ihor Dow, FNP-C Palliative Medicine Team  Phone: [phone] Fax: [phone]

## 2016-01-16 NOTE — Progress Notes (Signed)
Patient has been in some respiratory distress, oxygen saturation has maintained in the 90's on 4 liters. Given PRN roxinol and PO ativan with minimal relief. Orders for one time IV morphine given per MD and now patient is resting more comfortably. Will continue to monitor.

## 2016-01-16 NOTE — Progress Notes (Signed)
Patient's heart rate came back down to the 80's NSR. MD paged and stated to not give IV cardizem. Will continue to monitor.

## 2016-01-16 NOTE — Progress Notes (Signed)
Patient's heart rate sustaining in the 160's SVT. Patient is short of breath, but does not feel her heart racing, she just feels hot. PO metoprolol and cardizem given. Paged Dr. Lavetta Nielsen. MD ordered 10mg  of IV cardizem.

## 2016-01-16 NOTE — Telephone Encounter (Signed)
Noted pt in hospital and cardiology to be called

## 2016-01-16 NOTE — Progress Notes (Signed)
Rachel Shah: Rachel Shah    MRN#:  [id number]  DATE OF BIRTH:  05-Jun-1910  SUBJECTIVE:  Hospital Day: 2 days Rachel Shah is a 82 y.o. female presenting with Nasal Congestion; Cough; and Diarrhea .   Overnight events: Agitated overnight Interval Events: Patient SVT this morning, evaluated bedside less responsive and unable to provide information  REVIEW OF SYSTEMS:  Unable to obtain given Mental status  DRUG ALLERGIES:   Allergies  Allergen Reactions  . Plavix [Clopidogrel Bisulfate]     STOMACH UPSET  . Coumadin [Warfarin Sodium] Other (See Comments)    Caused internal bleeding  . Penicillins Other (See Comments)    Has patient had a PCN reaction causing immediate rash, facial/tongue/throat swelling, SOB or lightheadedness with hypotension: No Has patient had a PCN reaction causing severe rash involving mucus membranes or skin necrosis: No Has patient had a PCN reaction that required hospitalization No Has patient had a PCN reaction occurring within the last 10 years: No If all of the above answers are "NO", then may proceed with Cephalosporin use.     VITALS:  Blood pressure 135/85, pulse 61, temperature 97.4 F (36.3 C), temperature source Oral, resp. rate 18, height 5\' 4"  (1.626 m), weight 63.5 kg (140 lb), SpO2 96 %.  PHYSICAL EXAMINATION:   VITAL SIGNS: Vitals:   01/16/16 0926 01/16/16 1226  BP: 135/85   Pulse: (!) 153 61  Resp:  18  Temp:  97.4 F (36.3 C)   GENERAL:82 y.o.female moderate distress given mental status. And respiratory status HEAD: Normocephalic, atraumatic.  EYES: Pupils equal, round, reactive to light. Unable to assess extraocular muscles given mental status/medical condition. No scleral icterus.  MOUTH: Moist mucosal membrane. Dentition intact. No abscess noted.  EAR, NOSE, THROAT: Clear without exudates. No external lesions.  NECK: Supple. No thyromegaly. No  nodules. No JVD.  PULMONARY: Diffuse coarse rhonchi . No use of accessory muscles, Good respiratory effort. good air entry bilaterally CHEST: Nontender to palpation.  CARDIOVASCULAR: S1 and S2. Regular rate and rhythm. No murmurs, rubs, or gallops. No edema. Pedal pulses 2+ bilaterally.  GASTROINTESTINAL: Soft, nontender, nondistended. No masses. Positive bowel sounds. No hepatosplenomegaly.  MUSCULOSKELETAL: No swelling, clubbing, or edema. Range of motion full in all extremities.  NEUROLOGIC: Unable to assess given mental status/medical condition SKIN: No ulceration, lesions, rashes, or cyanosis. Skin warm and dry. Turgor intact.  PSYCHIATRIC: Unable to assess given mental status/medical condition      LABORATORY PANEL:   CBC  Recent Labs Lab 01/15/16 0320  WBC 3.8  HGB 13.3  HCT 39.9  PLT 142*   ------------------------------------------------------------------------------------------------------------------  Chemistries   Recent Labs Lab 01/27/2016 1407  01/15/16 0320 01/16/16 0821  NA 136  --  138 135  K 3.1*  --  4.0 4.0  CL 105  --  105 103  CO2 25  --  24 25  GLUCOSE 120*  --  104* 104*  BUN 23*  --  25* 28*  CREATININE 1.03*  --  0.91 1.02*  CALCIUM 8.4*  --  8.3* 8.5*  MG <0.1*  < > 3.0*  --   AST 31  --   --   --   ALT 18  --   --   --   ALKPHOS 40  --   --   --   BILITOT 0.6  --   --   --   < > = values in  this interval not displayed. ------------------------------------------------------------------------------------------------------------------  Cardiac Enzymes  Recent Labs Lab 01/10/2016 1407  TROPONINI 0.03*   ------------------------------------------------------------------------------------------------------------------  RADIOLOGY:  Dg Chest Port 1 View  Result Date: 01/15/2016 CLINICAL DATA:  Followup cough EXAM: PORTABLE CHEST 1 VIEW COMPARISON:  Yesterday FINDINGS: Progression of diffuse interstitial opacity. Small bilateral pleural  effusions may be present. Cardiomegaly accentuated by rotation. Dual-chamber pacer leads from the left in unremarkable position. No asymmetric consolidative opacity. No pneumothorax. IMPRESSION: Progressive interstitial opacities which could be congestive or infectious. Electronically Signed   By: Monte Fantasia M.D.   On: 01/15/2016 11:57   Dg Chest Portable 1 View  Result Date: 01/12/2016 CLINICAL DATA:  Shortness of breath and wheezing with productive cough. EXAM: PORTABLE CHEST 1 VIEW COMPARISON:  01/30/2012 FINDINGS: 1510 hours. The cardio pericardial silhouette is enlarged. Interstitial markings are diffusely coarsened with chronic features. No focal airspace consolidation or overt airspace pulmonary edema. Left permanent pacemaker again noted. Telemetry leads overlie the chest. IMPRESSION: Cardiomegaly with chronic underlying interstitial lung disease. Electronically Signed   By: Misty Stanley M.D.   On: 01/17/2016 15:33    EKG:   Orders placed or performed during the hospital encounter of 01/01/2016  . ED EKG  . ED EKG  . EKG 12-Lead  . EKG 12-Lead    ASSESSMENT AND PLAN:   Rachel Shah is a 82 y.o. female presenting with Nasal Congestion; Cough; and Diarrhea . Admitted 01/07/2016 : Day #: 2 days 1. 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 Hypomag: resolved 3. Hypok: resolved 4. SVT: Order to receive medication however spontaneously converted this morning back to normal sinus  Spoke with the daughter at bedside  spoke with patient's grandson over the phone: Rachel Shah [phone] In regards to her overall prognosis I suspect this is rather grim prognosis will try gentle diuresis family realizes this will likely provide minimal relief and states if there is no hope would like to transition to comfort  measures, case discussed with palliative care as well  All the records are reviewed and case discussed with Care Management/Social Workerr. Management plans discussed with the patient, family and they are in agreement.  CODE STATUS: dnr TOTAL TIME TAKING CARE OF THIS PATIENT: 33 minutes.   POSSIBLE D/C IN 2-3DAYS, DEPENDING ON CLINICAL CONDITION.   Carolann Brazell,  [name] on 01/16/2016 at 2:59 PM  Between 7am to 6pm - Pager - [phone]  After 6pm: House Pager: - [phone]  [name]  Office  [phone]  CC: Primary care physician; [name], MD

## 2016-01-16 NOTE — Progress Notes (Signed)
Due to increased WOB patient was Placed on HFNC 45 liters and 36%. Dr Lavetta Nielsen and Denny Peon RN aware of change. Patient tol well and WOB improved with sats of 97% Will continue to monitor patient.

## 2016-01-17 DIAGNOSIS — Z515 Encounter for palliative care: Secondary | ICD-10-CM

## 2016-01-17 DIAGNOSIS — F411 Generalized anxiety disorder: Secondary | ICD-10-CM

## 2016-01-17 DIAGNOSIS — Z7189 Other specified counseling: Secondary | ICD-10-CM

## 2016-01-17 DIAGNOSIS — R0600 Dyspnea, unspecified: Secondary | ICD-10-CM

## 2016-01-17 LAB — BASIC METABOLIC PANEL
Anion gap: 7 (ref 5–15)
BUN: 19 mg/dL (ref 6–20)
CHLORIDE: 103 mmol/L (ref 101–111)
CO2: 25 mmol/L (ref 22–32)
Calcium: 7.9 mg/dL — ABNORMAL LOW (ref 8.9–10.3)
Creatinine, Ser: 0.78 mg/dL (ref 0.44–1.00)
GFR calc Af Amer: >60 mL/min (ref >60)
GLUCOSE: 135 mg/dL — AB (ref 65–99)
POTASSIUM: 3.6 mmol/L (ref 3.5–5.1)
Sodium: 135 mmol/L (ref 135–145)

## 2016-01-17 MED ORDER — LORAZEPAM 2 MG/ML IJ SOLN
0.5000 mg | INTRAMUSCULAR | Status: DC | PRN
  Administered 2016-01-18 (×3): 0.5 mg via INTRAVENOUS
  Filled 2016-01-17 (×3): qty 1

## 2016-01-17 MED ORDER — MORPHINE SULFATE (PF) 4 MG/ML IV SOLN
1.0000 mg | INTRAVENOUS | Status: DC | PRN
  Administered 2016-01-17 (×2): 1 mg via INTRAVENOUS
  Filled 2016-01-17 (×2): qty 1

## 2016-01-17 MED ORDER — IPRATROPIUM-ALBUTEROL 0.5-2.5 (3) MG/3ML IN SOLN
3.0000 mL | Freq: Four times a day (QID) | RESPIRATORY_TRACT | Status: DC
  Administered 2016-01-17 – 2016-01-18 (×5): 3 mL via RESPIRATORY_TRACT
  Filled 2016-01-17 (×5): qty 3

## 2016-01-17 MED ORDER — GLYCOPYRROLATE 0.2 MG/ML IJ SOLN
0.2000 mg | INTRAMUSCULAR | Status: DC | PRN
  Administered 2016-01-17 – 2016-01-18 (×4): 0.2 mg via INTRAVENOUS
  Filled 2016-01-17 (×4): qty 1

## 2016-01-17 NOTE — Care Management (Signed)
Patient continues with respiratory issues. There is discussion of comfort measures but no decision yet.  Patient is receiving Roxanol

## 2016-01-17 NOTE — Progress Notes (Signed)
[facility] NAME: Saja Bartolini    MRN#:  [id number]  DATE OF BIRTH:  Sep 17, 1910  SUBJECTIVE:  Hospital Day: 3 days Ruhani Umland is a 82 y.o. female presenting with Nasal Congestion; Cough; and Diarrhea .   Overnight events: on hi flow, no overnight events Interval Events: on hiflow, no complaints, family at bedside  REVIEW OF SYSTEMS:  Unable to obtain given Mental status  DRUG ALLERGIES:   Allergies  Allergen Reactions  . Plavix [Clopidogrel Bisulfate]     STOMACH UPSET  . Coumadin [Warfarin Sodium] Other (See Comments)    Caused internal bleeding  . Penicillins Other (See Comments)    Has patient had a PCN reaction causing immediate rash, facial/tongue/throat swelling, SOB or lightheadedness with hypotension: No Has patient had a PCN reaction causing severe rash involving mucus membranes or skin necrosis: No Has patient had a PCN reaction that required hospitalization No Has patient had a PCN reaction occurring within the last 10 years: No If all of the above answers are "NO", then may proceed with Cephalosporin use.     VITALS:  Blood pressure (!) 98/49, pulse 62, temperature 97.4 F (36.3 C), temperature source Oral, resp. rate 19, height 5' 4"  (1.626 m), weight 63.5 kg (140 lb), SpO2 95 %.  PHYSICAL EXAMINATION:   VITAL SIGNS: Vitals:   01/17/16 0825 01/17/16 1150  BP:  (!) 98/49  Pulse:  62  Resp: 20 19  Temp:  97.4 F (36.3 C)   GENERAL:82 y.o.female currently in no acute distress.  HEAD: Normocephalic, atraumatic.  EYES: Pupils equal, round, reactive to light. Extraocular muscles intact. No scleral icterus.  MOUTH: Moist mucosal membrane. Dentition intact. No abscess noted.  EAR, NOSE, THROAT: Clear without exudates. No external lesions.  NECK: Supple. No thyromegaly. No nodules. No JVD.  PULMONARY: diffuse coarse rhonchi. No use of accessory muscles, Good respiratory effort. good air  entry bilaterally CHEST: Nontender to palpation.  CARDIOVASCULAR: S1 and S2. Regular rate and rhythm. No murmurs, rubs, or gallops. 1+edema. Pedal pulses 2+ bilaterally.  GASTROINTESTINAL: Soft, nontender, nondistended. No masses. Positive bowel sounds. No hepatosplenomegaly.  MUSCULOSKELETAL: No swelling, clubbing, or edema. Range of motion full in all extremities.  NEUROLOGIC: Cranial nerves II through XII are intact. No gross focal neurological deficits. Sensation intact. Reflexes intact.  SKIN: No ulceration, lesions, rashes, or cyanosis. Skin warm and dry. Turgor intact.  PSYCHIATRIC: Mood, affect within normal limits. The patient is awake, alert and oriented x 2. Insight, judgment intact.       LABORATORY PANEL:   CBC  Recent Labs Lab 01/15/16 0320  WBC 3.8  HGB 13.3  HCT 39.9  PLT 142*   ------------------------------------------------------------------------------------------------------------------  Chemistries   Recent Labs Lab 12/29/2015 1407  01/15/16 0320  01/17/16 0758  NA 136  --  138  < > 135  K 3.1*  --  4.0  < > 3.6  CL 105  --  105  < > 103  CO2 25  --  24  < > 25  GLUCOSE 120*  --  104*  < > 135*  BUN 23*  --  25*  < > 19  CREATININE 1.03*  --  0.91  < > 0.78  CALCIUM 8.4*  --  8.3*  < > 7.9*  MG <0.1*  < > 3.0*  --   --   AST 31  --   --   --   --  ALT 18  --   --   --   --   ALKPHOS 40  --   --   --   --   BILITOT 0.6  --   --   --   --   < > = values in this interval not displayed. ------------------------------------------------------------------------------------------------------------------  Cardiac Enzymes  Recent Labs Lab 12/28/2015 1407  TROPONINI 0.03*   ------------------------------------------------------------------------------------------------------------------  RADIOLOGY:  No results found.  EKG:   Orders placed or performed during the hospital encounter of 01/11/2016  . ED EKG  . ED EKG  . EKG 12-Lead  . EKG 12-Lead     ASSESSMENT AND PLAN:   Roderica Cathell is a 82 y.o. female presenting with Nasal Congestion; Cough; and Diarrhea . Admitted 12/30/2015 : Day #: 3 days 1. 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 Hypomag: resolved 3. Hypok: resolved  Family met with palliative care earlier, I reiterated the same - EOL care, transition to comfort, family wants to wait until tomorrow - but if she declines acutely to start comfort measures   All the records are reviewed and case discussed with Care Management/Social Workerr. Management plans discussed with the patient, family and they are in agreement.  CODE STATUS: dnr TOTAL TIME TAKING CARE OF THIS PATIENT: 33 minutes.   POSSIBLE D/C IN 2-3DAYS, DEPENDING ON CLINICAL CONDITION.   Thao Vanover,  [name] on 01/17/2016 at 3:57 PM  Between 7am to 6pm - Pager - [phone]  After 6pm: House Pager: - [facility]  Office  [phone]  CC: Primary care physician; [name], MD

## 2016-01-17 NOTE — Consult Note (Signed)
Consultation Note Date: 01/17/2016   Patient Name: Rachel Shah  DOB: [birth year]  MRN: [id number]  Age / Sex: 82 y.o., female  PCP: Rachel Bush, MD Referring Physician: Lytle Butte, MD  Reason for Consultation: Establishing goals of care  HPI/Patient Profile: 82 y.o. female  with past medical history of TIA, chronic diastolic heart failure, tachy-brady syndrome s/p pacemaker, SVT, osteoporosis, hypertension, hyperlipidemia, DVT, and atrial fibrillation admitted on 01/26/2016 with shortness of breath and diarrhea from independent living facility. Per daughter, she has had audible wheezing, SOB, weakness, and having panic attacks. CXR revealed chronic interstitial changes with cardiomegaly. Stool negative for cdiff. On 12/20 morning, patient had SVT and has since converted to NSR. She has acute respiratory failure with hypoxia due in the setting of acute on chronic diastolic and systolic hear failure-cardiology consulted. Receiving IV lasix 75m daily. On 12/20 afternoon, patient with increased WOB and hypoxia-placed on high flow [place]. Palliative medicine consultation for goals of care.    Clinical Assessment and Goals of Care: Initially met patient this morning. She is alert, pleasantly confused, and hard of hearing. She appears comfortable sitting in chair but on high flow [place]. Per nursing, she has been intermittently anxious and restless. Recently given prn Roxanol and this has seemed to help these symptoms.    This NP met with daughter (Rachel Shah and three grandchildren (Rachel [facility] and [facility] in the family waiting room. There are two other grandchildren that live out of town and multiple great grandchildren. The family describes Rachel Shah as a strong-willed, independent, and caring individual. They tell me she has been healthy most of her life, until her 1119'swhen she started having heart problems.  Often times, she refused prescription medications that were recommended. Ms. CFinihas travelled the world, was still working full time in her 855's and even went white water rafting in her 953's The family cannot imagine life without Ms. CWenzbecause she has "been around our entire lives."   We discussed in detail current medical status including aggressive interventions and medications. All members of the family seem to understand the severity of her failing heart and lungs and seem realistic in her poor prognosis. Discussed a continued aggressive medical intervention pathway versus comfort measures. They asked how long could she be on high flow? Discussed that she may not be able to wean from high flow and if not, this would only prolong suffering if she is at the end-of-life. Educated on comfort measures, aggressive symptom management, and no escalation of care. Explained that medications would be given for comfort prior to weaning her off of high flow [place]. Briefly discussed EOL expectations and transition to a hospice home, if she is stable enough to leave the hospital.   They all agree that comfort and dignity are top priority and do not want to see her suffer. Rachel Dawnis not ready to transition to a comfort measure approach and would like another 24 hours. She wishes her grandmother "would just pass in her sleep so we don't  have to make this decision." The family is most concerned about having to make this decision because she is somewhat cognitively intact and able to communicate with them. Rachel Shah repeatedly tells me she has a "strong will to live but her body is failing her." Offered that we could discuss these decisions with the patient and evaluate her understanding of the severity of her condition, but Rachel Shah and Rachel Kidney do not want to discuss this with her. They want to keep this as "normal as possible." Daughter, Rachel Shah tells me that her mother does not want to talk about the concept of death and dying  and asked their pastor to leave yesterday. Rachel Shah tells me she feels her mother is anxious and afraid of dying and therefore will not talk about this with anyone.     Family requesting more time to process our conversation and not ready for full comfort measures. They want to continue high flow [place] and lasix. If she should decline today or tonight, they do not want care to be escalated but for her to be kept comfortable and symptoms managed. Answered many questions and concerns for this family. Provided Hard Choices copies.    SUMMARY OF RECOMMENDATIONS    DNR/DNI  Continue current interventions. Family not ready for transition to comfort care.   If she should decline in the next 24hrs, they want her to be comfortable and die with dignity.   Symptom management-see below. May give IV medications if she is unable to take PO.  Updated Dr. Lavetta Nielsen.  PMT will f/u tomorrow, 12/22 to further support family with decision making and EOL care.   Code Status/Advance Care Planning:  DNR   Symptom Management-IV medications ordered if patient not able to take PO.   Morphine 64m IV q2h prn pain/dyspnea/air hunger  Ativan 0.564mIV q4h prn anxiety/agitation  Robinul 0.36m33mV q4h prn secretions  Palliative Prophylaxis:   Aspiration, Delirium Protocol, Frequent Pain Assessment, Oral Care and Turn Reposition  Psycho-social/Spiritual:   Desire for further Chaplaincy support:yes  Additional Recommendations: Caregiving  Support/Resources and Education on Hospice  Prognosis:   Unable to determine guarded--patient with acute respiratory failure with hypoxia on high flow nasal cannula in the setting of acute on chronic diastolic/systolic heart failure.   Discharge Planning: To Be Determined-possibly hospice home vs. Hospital death     Primary Diagnoses: Present on Admission: . Hypomagnesemia   I have reviewed the medical record, interviewed the patient and family, and examined the patient.  The following aspects are pertinent.  Past Medical History:  Diagnosis Date  . Abnormal thyroid function test    Mildly abnormal thyroid function studies with a TSH of 7.984 (upper limit of normal 4.5), free T4 within normal limits at 1.21 and T3  slightly low at 69.2 (lower limit of normal 80), follow up with primary care  . Atrial fibrillation ([place] . Chronic diastolic heart failure (HCC)    a. hx of diastolic CHF;  b. dx with systolic CH[place] [facility]  [place] s/p bilat thoracentesis with neg cytology;   d. Echo 9/13:  mod LAE, mild LVH, EF 30-35%, ant septal severe HK, Gr 2 diast dysfn, mild reduced RVSF, mild MR, Ao sclerosis without AS, mild to mod TR, RVSP 51, mild to mod PR;  e. Echo 11/13: EF 60%  . CVA (cerebral infarction) 09/2011   evidence found on latest eye exam with R hemianopsia per  ophtho  . DVT (deep venous thrombosis) ([place]) 09/2011   partially occlusive right peroneal vein DVT by [place] at [facility] in [place], [place]  . Fibroids    hx  . Generalized muscle weakness 03/2014   working with PT at rehab  . Hard of hearing   . High cholesterol   . Hypertension   . Lower GI bleeding   . Nontraumatic hemoperitoneum 11/2011   with hemorrhagic shock from coumadin induced coagulopathy so stopped  . Osteoporosis    presumed as on actonel and compression deformities noted on CXR  . Supraventricular tachycardia ([place])   . Tachy-brady syndrome (HCC)    post [employer] revealed pulse generator dual lead pacemaker  . Thrombocytopenia (HCC)    Mild  . Transient ischemic attack 2007   Social History   Social History  . Marital status: Widowed    Spouse name: N/A  . Number of children: N/A  . Years of education: N/A   Social History Main Topics  . Smoking status: Never Smoker  . Smokeless tobacco: Never Used  . Alcohol use No  . Drug use: No  . Sexual activity: No   Other Topics Concern  . None   Social History  Narrative   Recently moved from [place] to live with daughter   Now moved into independent living facility (2015)   Family History  Problem Relation Age of Onset  . Cancer Sister   . CAD Neg Hx    Scheduled Meds: . diltiazem  240 mg Oral Daily  . diltiazem  10 mg Intravenous Once  . enoxaparin (LOVENOX) injection  30 mg Subcutaneous Q24H  . furosemide  20 mg Intravenous Daily  . ipratropium-albuterol  3 mL Nebulization Q6H  . metoprolol tartrate  25 mg Oral Daily  . sodium chloride flush  3 mL Intravenous Q12H   Continuous Infusions: PRN Meds:.acetaminophen **OR** acetaminophen, glycopyrrolate, loperamide, LORazepam, LORazepam, morphine injection, morphine CONCENTRATE, ondansetron **OR** ondansetron (ZOFRAN) IV Medications Prior to Admission:  Prior to Admission medications   Medication Sig Start Date End Date Taking? Authorizing Provider  diltiazem (CARDIZEM CD) 240 MG 24 hr capsule TAKE 1 CAPSULE (240 MG TOTAL) BY MOUTH DAILY. 06/15/15  Yes Rachel Bush, MD  furosemide (LASIX) 20 MG tablet TAKE ONE TABLET DAILY 06/15/15  Yes Rachel Bush, MD  metoprolol tartrate (LOPRESSOR) 25 MG tablet Take 1 tablet (25 mg total) by mouth daily. 06/15/15  Yes Rachel Bush, MD   Allergies  Allergen Reactions  . Plavix [Clopidogrel Bisulfate]     STOMACH UPSET  . Coumadin [Warfarin Sodium] Other (See Comments)    Caused internal bleeding  . Penicillins Other (See Comments)    Has patient had a PCN reaction causing immediate rash, facial/tongue/throat swelling, SOB or lightheadedness with hypotension: No Has patient had a PCN reaction causing severe rash involving mucus membranes or skin necrosis: No Has patient had a PCN reaction that required hospitalization No Has patient had a PCN reaction occurring within the last 10 years: No If all of the above answers are "NO", then may proceed with Cephalosporin use.    Review of Systems  Unable to perform ROS: Acuity of condition    Physical Exam  Constitutional: She is cooperative. She appears ill.  HENT:  Head: Normocephalic and atraumatic.  Cardiovascular: Regular rhythm.  Exam reveals distant heart sounds.   Pulmonary/Chest: Effort normal. She has decreased breath sounds.  On HFNC  Abdominal: Normal appearance.  Neurological: She is alert. She is disoriented.  Pleasantly  confused  Skin: Skin is warm and dry.  Psychiatric: She has a normal mood and affect. Cognition and memory are impaired.  Intermittent anxiety/restlessness She is inattentive.  Nursing note and vitals reviewed.  Vital Signs: BP (!) 98/49 (BP Location: Left Arm)   Pulse 62   Temp 97.4 F (36.3 C) (Oral)   Resp 19   Ht 5' 4"  (1.626 m)   Wt 63.5 kg (140 lb)   SpO2 98%   BMI 24.03 kg/m  Pain Assessment: No/denies pain POSS *See Group Information*: 1-Acceptable,Awake and alert Pain Score: 4    SpO2: SpO2: 98 % O2 Device:SpO2: 98 % O2 Flow Rate: .O2 Flow Rate (L/min): 45 L/min  IO: Intake/output summary:   Intake/Output Summary (Last 24 hours) at 01/17/16 1425 Last data filed at 01/17/16 1116  Gross per 24 hour  Intake                3 ml  Output              300 ml  Net             -297 ml    LBM: Last BM Date: 01/16/16 Baseline Weight: Weight: 63.5 kg (140 lb) Most recent weight: Weight: 63.5 kg (140 lb)     Palliative Assessment/Data: PPS 40%   Flowsheet Rows   Flowsheet Row Most Recent Value  Intake Tab  Referral Department  Hospitalist  Unit at Time of Referral  Cardiac/Telemetry Unit  Palliative Care Primary Diagnosis  Cardiac  Date Notified  01/25/2016  Palliative Care Type  New Palliative care  Reason for referral  Clarify Goals of Care  Date of Admission  01/16/2016  Date first seen by Palliative Care  01/17/16  # of days IP prior to Palliative referral  0  Clinical Assessment  Palliative Performance Scale Score  40%  Psychosocial & Spiritual Assessment  Palliative Care Outcomes  Patient/Family meeting  held?  Yes  Who was at the meeting?  daughter, granddaughter, and two grandson's  Palliative Care Outcomes  Clarified goals of care, Provided psychosocial or spiritual support, Counseled regarding hospice      Time In: 1000 Time Out: 5927 Time Total: 138mn Greater than 50%  of this time was spent counseling and coordinating care related to the above assessment and plan.  Signed by:  MIhor Dow FNP-C Palliative Medicine Team  Phone: [phone]: [phone]  Please contact Palliative Medicine Team phone at [phone] questions and concerns.  For individual provider: See AShea Evans

## 2016-01-18 ENCOUNTER — Inpatient Hospital Stay: Payer: Medicare Other

## 2016-01-18 DIAGNOSIS — Z515 Encounter for palliative care: Secondary | ICD-10-CM

## 2016-01-18 LAB — CBC
HCT: 39.9 % (ref 35.0–47.0)
Hemoglobin: 13.8 g/dL (ref 12.0–16.0)
MCH: 31.9 pg (ref 26.0–34.0)
MCHC: 34.5 g/dL (ref 32.0–36.0)
MCV: 92.6 fL (ref 80.0–100.0)
PLATELETS: 193 10*3/uL (ref 150–440)
RBC: 4.31 MIL/uL (ref 3.80–5.20)
RDW: 15.6 % — AB (ref 11.5–14.5)
WBC: 9.4 10*3/uL (ref 3.6–11.0)

## 2016-01-18 MED ORDER — MORPHINE SULFATE (CONCENTRATE) 10 MG/0.5ML PO SOLN: 5.0000 mg | ORAL | Status: DC | PRN

## 2016-01-18 MED ORDER — MORPHINE SULFATE (PF) 4 MG/ML IV SOLN
1.0000 mg | INTRAVENOUS | Status: DC | PRN
  Administered 2016-01-18: 1 mg via INTRAVENOUS
  Filled 2016-01-18: qty 1

## 2016-01-18 MED ORDER — LORAZEPAM 0.5 MG PO TABS: 0.5000 mg | ORAL_TABLET | ORAL | Status: DC | PRN

## 2016-01-18 MED ORDER — IPRATROPIUM-ALBUTEROL 0.5-2.5 (3) MG/3ML IN SOLN: 3.0000 mL | Freq: Four times a day (QID) | RESPIRATORY_TRACT | Status: DC | PRN

## 2016-01-18 NOTE — Progress Notes (Signed)
[place] [facility] [place] NAME: Rachel Shah    MRN#:  [id number]  DATE OF BIRTH:  January 29, 1910  SUBJECTIVE:  Hospital Day: 4 days Rachel Shah is a 82 y.o. female presenting with Nasal Congestion; Cough; and Diarrhea .   Overnight events: breathing difficulty overnight Interval Events: on hiflow, no complaints  REVIEW OF SYSTEMS:  Unable to obtain given Mental status  DRUG ALLERGIES:   Allergies  Allergen Reactions  . Plavix [Clopidogrel Bisulfate]     STOMACH UPSET  . Coumadin [Warfarin Sodium] Other (See Comments)    Caused internal bleeding  . Penicillins Other (See Comments)    Has patient had a PCN reaction causing immediate rash, facial/tongue/throat swelling, SOB or lightheadedness with hypotension: No Has patient had a PCN reaction causing severe rash involving mucus membranes or skin necrosis: No Has patient had a PCN reaction that required hospitalization No Has patient had a PCN reaction occurring within the last 10 years: No If all of the above answers are "NO", then may proceed with Cephalosporin use.     VITALS:  Blood pressure 106/64, pulse 77, temperature 98.7 F (37.1 C), temperature source Oral, resp. rate 16, height 5\' 4"  (1.626 m), weight 63.5 kg (140 lb), SpO2 92 %.  PHYSICAL EXAMINATION:   VITAL SIGNS: Vitals:   01/18/16 0436 01/18/16 0754  BP: (!) 111/53 106/64  Pulse: 85 77  Resp: (!) 24 16  Temp: 98.2 F (36.8 C) 98.7 F (37.1 C)   GENERAL:82 y.o.female currently in no acute distress.  HEAD: Normocephalic, atraumatic.  EYES: Pupils equal, round, reactive to light. Extraocular muscles intact. No scleral icterus.  MOUTH: Moist mucosal membrane. Dentition intact. No abscess noted.  EAR, NOSE, THROAT: Clear without exudates. No external lesions.  NECK: Supple. No thyromegaly. No nodules. No JVD.  PULMONARY: diffuse coarse rhonchi. No use of accessory muscles, Good respiratory effort.  good air entry bilaterally CHEST: Nontender to palpation.  CARDIOVASCULAR: S1 and S2. Regular rate and rhythm. No murmurs, rubs, or gallops. 1+edema. Pedal pulses 2+ bilaterally.  GASTROINTESTINAL: Soft, nontender, nondistended. No masses. Positive bowel sounds. No hepatosplenomegaly.  MUSCULOSKELETAL: No swelling, clubbing, or edema. Range of motion full in all extremities.  NEUROLOGIC: Cranial nerves II through XII are intact. No gross focal neurological deficits. Sensation intact. Reflexes intact.  SKIN: No ulceration, lesions, rashes, or cyanosis. Skin warm and dry. Turgor intact.  PSYCHIATRIC: Mood, affect within normal limits. The patient is awake, alert and oriented x 2. Insight, judgment intact.       LABORATORY PANEL:   CBC  Recent Labs Lab 01/18/16 0509  WBC 9.4  HGB 13.8  HCT 39.9  PLT 193   ------------------------------------------------------------------------------------------------------------------  Chemistries   Recent Labs Lab 01/11/2016 1407  01/15/16 0320  01/17/16 0758  NA 136  --  138  < > 135  K 3.1*  --  4.0  < > 3.6  CL 105  --  105  < > 103  CO2 25  --  24  < > 25  GLUCOSE 120*  --  104*  < > 135*  BUN 23*  --  25*  < > 19  CREATININE 1.03*  --  0.91  < > 0.78  CALCIUM 8.4*  --  8.3*  < > 7.9*  MG <0.1*  < > 3.0*  --   --   AST 31  --   --   --   --   ALT 18  --   --   --   --  ALKPHOS 40  --   --   --   --   BILITOT 0.6  --   --   --   --   < > = values in this interval not displayed. ------------------------------------------------------------------------------------------------------------------  Cardiac Enzymes  Recent Labs Lab 01/04/2016 1407  TROPONINI 0.03*   ------------------------------------------------------------------------------------------------------------------  RADIOLOGY:  Dg Chest 1 View  Result Date: 01/18/2016 CLINICAL DATA:  82 year old female with shortness of breath and cough. Atrial fibrillation. EXAM:  CHEST 1 VIEW COMPARISON:  Chest radiograph dated 01/15/2016 FINDINGS: There is cardiomegaly. There is prominence of the central vasculature as well as progression of bilateral mid to lower lung field airspace densities which may represent worsening congestive changes or pneumonia. There small bilateral pleural effusions. No pneumothorax. There is atherosclerotic calcification of the aortic arch. Left pectoral dual lead pacemaker device. Osteopenia with degenerative changes of the spine. No acute fracture. IMPRESSION: Interval worsening of vascular congestion and/or pneumonia. Clinical correlation and follow-up recommended. Electronically Signed   By: Anner Crete M.D.   On: 01/18/2016 02:03    EKG:   Orders placed or performed during the hospital encounter of 01/25/2016  . ED EKG  . ED EKG  . EKG 12-Lead  . EKG 12-Lead    ASSESSMENT AND PLAN:   Rachel Shah is a 82 y.o. female presenting with Nasal Congestion; Cough; and Diarrhea . Admitted 01/26/2016 : Day #: 4 days 1. 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 Hypomag: resolved 3. Hypok: resolved   Family seems agreeable on transitioning to comfort measures palliative care involved, appreciated  All the records are reviewed and case discussed with Care Management/Social Workerr. Management plans discussed with the patient, family and they are in agreement.  CODE STATUS: dnr TOTAL TIME TAKING CARE OF THIS PATIENT: 28 minutes.   POSSIBLE D/C IN 2-3DAYS, DEPENDING ON CLINICAL CONDITION.   Hower,  [name] on 01/18/2016 at 2:17 PM  Between 7am to 6pm - Pager - [phone]  After 6pm: House Pager: - [phone]  [name]  Office  [phone]  CC: Primary care physician; [name], MD

## 2016-01-18 NOTE — NC FL2 (Signed)
[place] LEVEL OF CARE SCREENING TOOL     IDENTIFICATION  Patient Name: Rachel Shah Birthdate: 02/16/1910 Sex: female Admission Date (Current Location): 01/06/2016  [place] and [place] Number:  Engineering geologist and Address:  [facility], [address] [place], [place] [address]      Provider Number: [id number]  Attending Physician Name and Address:  Lytle Butte, MD  Relative Name and Phone Number:  Baxter Kail Daughter [phone]  [phone]     Current Level of Care: Hospital Recommended Level of Care: [place] Prior Approval Number:    Date Approved/Denied:   PASRR Number: [id number]  Discharge Plan: SNF    Current Diagnoses: Patient Active Problem List   Diagnosis Date Noted  . Palliative care encounter   . Goals of care, counseling/discussion   . Encounter for hospice care discussion   . Dyspnea   . Anxiety state   . Hypomagnesemia 01/21/2016  . Memory impairment 02/04/2014  . Advanced care planning/counseling discussion 02/03/2014  . Pedal edema 02/03/2014  . Tremor 04/21/2013  . Osteoporosis   . SVT (supraventricular tachycardia) ([place]) 12/03/2011  . Chronic diastolic heart failure ([place]) 11/10/2011  . Pacemaker-MDT 06/26/2011  . HTN (hypertension) 12/16/2010  . Tachy-brady syndrome ([place]) 12/16/2010  . Atrial fibrillation ([place]) 12/16/2010    Orientation RESPIRATION BLADDER Height & Weight     Self  O2 (5L) Continent Weight: 140 lb (63.5 kg) Height:  5\' 4"  (162.6 cm)  BEHAVIORAL SYMPTOMS/MOOD NEUROLOGICAL BOWEL NUTRITION STATUS      Continent Diet (Cardiac diet)  AMBULATORY STATUS COMMUNICATION OF NEEDS Skin   Limited Assist Verbally Normal                       Personal Care Assistance Level of Assistance  Bathing, Feeding, Dressing Bathing Assistance: Limited assistance Feeding assistance: Limited assistance Dressing Assistance: Limited assistance     Functional  Limitations Info  Sight, Speech, Hearing Sight Info: Adequate Hearing Info: Adequate Speech Info: Adequate    SPECIAL CARE FACTORS FREQUENCY  PT (By licensed PT)     PT Frequency: 5x a week              Contractures Contractures Info: Not present    Additional Factors Info  Code Status, Allergies Code Status Info: DNR Allergies Info: PLAVIX CLOPIDOGREL BISULFATE, COUMADIN WARFARIN SODIUM, PENICILLINS            Current Medications (01/18/2016):  This is the current hospital active medication list Current Facility-Administered Medications  Medication Dose Route Frequency Provider Last Rate Last Dose  . acetaminophen (TYLENOL) tablet 650 mg  650 mg Oral Q6H PRN Gladstone Lighter, MD   650 mg at 01/15/16 1547   Or  . acetaminophen (TYLENOL) suppository 650 mg  650 mg Rectal Q6H PRN Gladstone Lighter, MD      . glycopyrrolate (ROBINUL) injection 0.2 mg  0.2 mg Intravenous Q4H PRN Basilio Cairo, NP   0.2 mg at 01/18/16 1605  . ipratropium-albuterol (DUONEB) 0.5-2.5 (3) MG/3ML nebulizer solution 3 mL  3 mL Nebulization Q6H PRN Basilio Cairo, NP      . loperamide (IMODIUM) capsule 2 mg  2 mg Oral PRN Lytle Butte, MD      . LORazepam (ATIVAN) injection 0.5 mg  0.5 mg Intravenous Q4H PRN Basilio Cairo, NP   0.5 mg at 01/18/16 1339  . LORazepam (ATIVAN) tablet 0.5 mg  0.5 mg Oral Q4H PRN Jinny Blossom  Selina Cooley, NP      . morphine 4 MG/ML injection 1 mg  1 mg Intravenous Q1H PRN Basilio Cairo, NP      . morphine CONCENTRATE 10 MG/0.5ML oral solution 5 mg  5 mg Oral Q1H PRN Basilio Cairo, NP      . ondansetron (ZOFRAN) tablet 4 mg  4 mg Oral Q6H PRN Gladstone Lighter, MD       Or  . ondansetron (ZOFRAN) injection 4 mg  4 mg Intravenous Q6H PRN Gladstone Lighter, MD      . sodium chloride flush (NS) 0.9 % injection 3 mL  3 mL Intravenous Q12H Gladstone Lighter, MD   3 mL at 01/18/16 [id number]     Discharge Medications: Please see discharge summary for a list of discharge  medications.  Relevant Imaging Results:  Relevant Lab Results:   Additional Information SSN [id number]  Ross Ludwig, [place]

## 2016-01-18 NOTE — Care Management Important Message (Signed)
Important Message  Patient Details  Name: Rachel Shah MRN: [id number] Date of Birth: [birth year]   Medicare Important Message Given:  Yes    Greysen Swanton A, RN 01/18/2016, 4:01 PM

## 2016-01-18 NOTE — Progress Notes (Signed)
Daily Progress Note   Patient Name: Rachel Shah       Date: 01/18/2016 DOB: 10-27-1910  Age: 82 y.o. MRN#: [id number] Attending Physician: Lytle Butte, MD Primary Care Physician: [name], MD Admit Date: 01/25/2016  Reason for Consultation/Follow-up: Establishing goals of care  Subjective: Initially visited the patient this morning. No family at bedside. She is sitting on the side of bed in tripod position in respiratory distress. On high flow. Recently given ativan. Requested RN give prn Roxanol. Updated daughter, Stanton Kidney via telephone. She tells me she is sick and not coming to the hospital till this evening. Daughter understands she "had a rough night" and that "aggressive oxygen is not fixing anything." Stanton Kidney is ready to transition to comfort measures and states "her disease is killing her" not the family, as they were hesitant yesterday to make the transition to comfort measures and wean from high flow. I explained to [facility] that I will ensure she is more comfortable before I wean her off of high flow.   Later this afternoon, requested RN give a prn dose of ativan and Roxanol. About an hour after medication given, I checked on patient and she was lying comfortably in bed with no respiratory distress. Patient was sleeping. Respiratory at bedside and weaned patient to 5L. At this point, patient was transitioned to comfort measures only and I updated daughter, Stanton Kidney.   I was called by nurse to update grandson Louie Casa) and granddaughter-in-law at bedside. He is at peace with the transition off of high flow. I discussed comfort measures, no escalation of care, EOL expectations and aggressive symptom management. We briefly discussed hospice facility versus hospice services at home. I  reassured Louie Casa that at this point, we monitor his mother one day at a time and she would not be moved from the hospital if uncomfortable or seemed to be closer to end-of-life. If stable for discharge, family would most likely request a hospice facility.   Answered all questions and offered support. Patient still appears comfortable with no s/s of distress this afternoon. Discussed with RN and she knows to give medications as needed for pain, dyspnea, air hunger, anxiety, or secretions.   Length of Stay: 4  Current Medications: Scheduled Meds:  . sodium chloride flush  3 mL Intravenous Q12H    Continuous Infusions:  PRN Meds: acetaminophen **OR** acetaminophen, glycopyrrolate, ipratropium-albuterol, loperamide, LORazepam, LORazepam, morphine injection, morphine CONCENTRATE, ondansetron **OR** ondansetron (ZOFRAN) IV  Physical Exam  Constitutional: She appears ill. She appears distressed.  HENT:  Head: Normocephalic and atraumatic.  Cardiovascular: Regular rhythm.   Murmur heard. Pulmonary/Chest: Accessory muscle usage present. She is in respiratory distress. She has rhonchi.  Bilateral crackles/rhonchi. Respiratory distress-Informed RN to give prn Roxanol.   Abdominal: Soft. Bowel sounds are decreased.  Neurological: She is alert. She is disoriented.  Skin: Skin is warm and dry.  Psychiatric: Her speech is delayed. Cognition and memory are impaired.  Hard of hearing She is inattentive.  Nursing note and vitals reviewed.          Vital Signs: BP 106/64 (BP Location: Right Arm)   Pulse 77   Temp 98.7 F (37.1 C) (Oral)   Resp 16   Ht 5\' 4"  (1.626 m)   Wt 63.5 kg (140 lb)   SpO2 93%   BMI 24.03 kg/m  SpO2: SpO2: 93 % O2 Device: O2 Device: Nasal Cannula O2 Flow Rate: O2 Flow Rate (L/min): 5 L/min  Intake/output summary:   Intake/Output Summary (Last 24 hours) at 01/18/16 1943 Last data filed at 01/18/16 1431  Gross per 24 hour  Intake              240 ml  Output                 0 ml  Net              240 ml   LBM: Last BM Date: 01/16/16 Baseline Weight: Weight: 63.5 kg (140 lb) Most recent weight: Weight: 63.5 kg (140 lb)       Palliative Assessment/Data: PPS 40%   Flowsheet Rows   Flowsheet Row Most Recent Value  Intake Tab  Referral Department  Hospitalist  Unit at Time of Referral  Cardiac/Telemetry Unit  Palliative Care Primary Diagnosis  Cardiac  Date Notified  01/17/2016  Palliative Care Type  New Palliative care  Reason for referral  Clarify Goals of Care  Date of Admission  01/02/2016  Date first seen by Palliative Care  01/17/16  # of days IP prior to Palliative referral  0  Clinical Assessment  Palliative Performance Scale Score  40%  Psychosocial & Spiritual Assessment  Palliative Care Outcomes  Patient/Family meeting held?  Yes  Who was at the meeting?  daughter, grandson, granddaughter-in-law  Palliative Care Outcomes  Improved pain interventions, Improved non-pain symptom therapy, Clarified goals of care, Counseled regarding hospice, Provided end of life care assistance, Provided psychosocial or spiritual support, Changed to focus on comfort      Patient Active Problem List   Diagnosis Date Noted  . Palliative care encounter   . Goals of care, counseling/discussion   . Encounter for hospice care discussion   . Dyspnea   . Anxiety state   . Hypomagnesemia 01/18/2016  . Memory impairment 02/04/2014  . Advanced care planning/counseling discussion 02/03/2014  . Pedal edema 02/03/2014  . Tremor 04/21/2013  . Osteoporosis   . SVT (supraventricular tachycardia) ([place]) 12/03/2011  . Chronic diastolic heart failure ([place]) 11/10/2011  . Pacemaker-MDT 06/26/2011  . HTN (hypertension) 12/16/2010  . Tachy-brady syndrome ([place]) 12/16/2010  . Atrial fibrillation ([place]) 12/16/2010    Palliative Care Assessment & Plan   Patient Profile: 82 y.o. female  with past medical history of TIA, chronic diastolic heart failure, tachy-brady  syndrome s/p pacemaker, SVT, osteoporosis, hypertension, hyperlipidemia, DVT, and  atrial fibrillation admitted on 01/20/2016 with shortness of breath and diarrhea from independent living facility. Per daughter, she has had audible wheezing, SOB, weakness, and having panic attacks. CXR revealed chronic interstitial changes with cardiomegaly. Stool negative for cdiff. On 12/20 morning, patient had SVT and has since converted to NSR. She has acute respiratory failure with hypoxia due in the setting of acute on chronic diastolic and systolic hear failure-cardiology consulted. Receiving IV lasix 20mg  daily. On 12/20 afternoon, patient with increased WOB and hypoxia-placed on high flow [place]. Palliative medicine consultation for goals of care.    Assessment: Acute respiratory failure with hypoxia Acute on chronic combined diastolic/systolic heart failure Tachy-brady syndrome s/p pacemaker SVT  Recommendations/Plan:  DNR/DNI  Daughter agrees with transition to comfort measures only. Discontinued medications/labs/interventions not aimed at comfort.   Discussed with respiratory. Weaned from high flow [place] to 5L [place]. May wean down to 2L for comfort.   Symptom management  Roxanol 5mg  PO q1h prn pain/dyspnea/air hunger  If unable to take oral may give morphine 1mg  IV q1h prn pain/dyspnea/air hunger  Ativan 0.5mg  PO or IV q4h prn anxiety/agitation  Robinul 0.2mg  IV q4h prn secretions  Updated Dr. Lavetta Nielsen.   Briefly discussed residential hospice vs. Home with hospice services with daughter and grandson. If stable for transfer over the weekend, family would most likely want residential hospice.  PMT not at [facility] over the weekend but will f/u 12/26 if patient still hospitalized.     Goals of Care and Additional Recommendations:  Limitations on Scope of Treatment: Full Comfort Care  Code Status: DNR   Code Status Orders        Start     Ordered   01/24/2016 2030  Do not attempt resuscitation (DNR)   Continuous    Question Answer Comment  In the event of cardiac or respiratory ARREST Do not call a "code blue"   In the event of cardiac or respiratory ARREST Do not perform Intubation, CPR, defibrillation or ACLS   In the event of cardiac or respiratory ARREST Use medication by any route, position, wound care, and other measures to relive pain and suffering. May use oxygen, suction and manual treatment of airway obstruction as needed for comfort.      12/28/2015 2029    Code Status History    Date Active Date Inactive Code Status Order ID Comments User Context   12/03/2011 10:28 AM 12/08/2011  4:30 PM Partial Code [id number]  Sorin June Leap, MD Inpatient   12/03/2011  6:15 AM 12/03/2011 10:28 AM Full Code [id number]  Gardiner Barefoot, NP Inpatient    Advance Directive Documentation   Flowsheet Row Most Recent Value  Type of Advance Directive  Healthcare Power of Attorney  Pre-existing out of facility DNR order (yellow form or pink MOST form)  No data  "MOST" Form in Place?  No data       Prognosis:   Hours - Days  Discharge Planning:  To Be Determined hospital death vs. Hospice home  Care plan was discussed with RN, daughter, grandson, granddaughter-in-law, and Dr. Lavetta Nielsen  Thank you for allowing the Palliative Medicine Team to assist in the care of this patient.   Time In: 1050, 1445 Time Out: 1130, 1515 Total Time 61min Prolonged Time Billed  yes       Greater than 50%  of this time was spent counseling and coordinating care related to the above assessment and plan.  Ihor Dow, FNP-C Palliative Medicine Team  Phone: [phone]  Fax: [phone]  Please contact Palliative Medicine Team phone at [phone] for questions and concerns.

## 2016-01-18 NOTE — Progress Notes (Signed)
Rn notified by patient sitter that patient was "not acting herself".Per sitter, patient was doing okay until she took a sip of water". Upper chest crackles could be heard. Patient seemed to be in some distress, patient was  attempting to sit up on side of bed, labored breathing. Despite that, vital signs were stable. Oxygen saturations were 92%. IV Ativan given. Patient seemed to calm down after ativan given. Still having slight difficulty breathing. MD H[place]yer notified. Verbal order given for STAT chest xray. Will continue to monitor.   Rachel Shah

## 2016-01-22 ENCOUNTER — Telehealth: Payer: Self-pay | Admitting: Family Medicine

## 2016-01-22 NOTE — Telephone Encounter (Signed)
Spoke with daughter, expressed my condolences. 

## 2016-01-28 NOTE — Discharge Summary (Signed)
   [place] [facility] [place] NAME: Rachel Shah    MR#:  [id number]  DATE OF BIRTH:  07/07/1910  DATE OF ADMISSION:  01/20/2016 ADMITTING PHYSICIAN: Gladstone Lighter, MD  DATE OF DISCHARGE: 2016/01/29  PRIMARY CARE PHYSICIAN: [name], MD    ADMISSION DIAGNOSIS:  Shortness of breath [R06.02] Hypokalemia, gastrointestinal losses [E87.6] Hypomagnesemia [E83.42] Elevated brain natriuretic peptide (BNP) level [R79.89] Diarrhea, unspecified type [R19.7]  DISCHARGE DIAGNOSIS:  Acute respiratory failure with hypoxia Acute on chronic combined systolic./diastolic congestive heart failure   SECONDARY DIAGNOSIS:   Past Medical History:  Diagnosis Date  . Abnormal thyroid function test    Mildly abnormal thyroid function studies with a TSH of 7.984 (upper limit of normal 4.5), free T4 within normal limits at 1.21 and T3  slightly low at 69.2 (lower limit of normal 80), follow up with primary care  . Atrial fibrillation ([place])   . Chronic diastolic heart failure (HCC)    a. hx of diastolic CHF;  b. dx with systolic CHF [id number] in [place], [place];   c. admx to [facility] 09/2011  in [place], [place] => s/p bilat thoracentesis with neg cytology;   d. Echo 9/13:  mod LAE, mild LVH, EF 30-35%, ant septal severe HK, Gr 2 diast dysfn, mild reduced RVSF, mild MR, Ao sclerosis without AS, mild to mod TR, RVSP 51, mild to mod PR;  e. Echo 11/13: EF 60%  . CVA (cerebral infarction) 09/2011   evidence found on latest eye exam with R hemianopsia per ophtho  . DVT (deep venous thrombosis) ([place]) 09/2011   partially occlusive right peroneal vein DVT by [place] at [facility] in [place], [place]  . Fibroids    hx  . Generalized muscle weakness 03/2014   working with PT at rehab  . Hard of hearing   . High cholesterol   . Hypertension   . Lower GI bleeding   . Nontraumatic hemoperitoneum 11/2011   with hemorrhagic shock from coumadin induced coagulopathy so stopped   . Osteoporosis    presumed as on actonel and compression deformities noted on CXR  . Supraventricular tachycardia ([place])   . Tachy-brady syndrome (HCC)    post [employer] revealed pulse generator dual lead pacemaker  . Thrombocytopenia (HCC)    Mild  . Transient ischemic attack 2007    HOSPITAL COURSE:  Rachel Shah  is a 83 y.o. female admitted 01/17/2016 with chief complaint shortness of breath. Please see H&P performed by Gladstone Lighter, MD for further information. Patient presented with the above symptoms that had been bothersome for some time. In addition to have low electrolytes. During her hospitalization she continued to decline and require higher and higher amounts of oxygen to maintain. Echo performed reveal combined systolic and diastolic heart failure. Attempts of diuresis proved futile, after much conversation the decision was made to transition to comfort measures. She was weaned on oxygen and provided supportive measures. She passed seemingly comfortably Jan 29, 2016 12:45am  DISCHARGE CONDITIONS:   expired  [place],  Rachel Shah.D on 01/29/2016 at 10:16 AM  Between 7am to 6pm - Pager - [phone]  After 6pm go to [email or web address] - [employer] [place] [facility]  Office  [phone]  CC: Primary care physician; [name], MD

## 2016-01-28 NOTE — Progress Notes (Signed)
Sitter notified RN of decreased respirations. Pt. Expired. MD in to pronounce. Family, Supervisor, [place] notified. [place] [facility] has pt as full release. [id number]. Page Harlow [place]ms. Family to bedside, informed supervisor of funeral home.

## 2016-01-28 NOTE — Progress Notes (Signed)
CH was paged to offer support and prayer with PT family after passing. Daughter was rather concerned about the funeral/body arrangements. Son assured her arrangements could be made tomorrow. CH offered to return when other family is present.

## 2016-01-28 NOTE — Progress Notes (Signed)
Patient ID: Rachel Shah, female   DOB: 1910/11/02, 83 y.o.   MRN: [id number]    [place] at [facility]    Death Note  Called by nursing to pronounce the death of Rachel Shah. On my arrival patient is not responsive to verbal tactile or painful stimuli. Her pupils are fixed and dilated. There are no corneal or gag reflexes. There are no heart tones or breath sounds. Time of death noted to be 12:45 AM on 02/15/16. RN to inform patient's family.   Death Note please see Last Note for all details.  Rachel Shah [id number] is a 83 y.o. female, Outpatient Primary MD for the patient is [name], MD  Pronounced dead by Feb 15, 2016   @ 12:45am                  Rachel Shah M.D on 2016-02-15 at 1:14 AM  [place] at [place]  Total clinical and documentation time for today Under 30 minutes   Last Note

## 2016-01-28 DEATH — deceased

## 2016-02-21 ENCOUNTER — Ambulatory Visit: Payer: Medicare Other | Admitting: Podiatry
